# Patient Record
Sex: Female | Born: 1975 | Race: White | Hispanic: No | Marital: Married | Smoking: Current every day smoker
Health system: Southern US, Community
[De-identification: ages and names within clinical notes are randomized; demographics above are authoritative.]

## PROBLEM LIST (undated history)

## (undated) DIAGNOSIS — C801 Malignant (primary) neoplasm, unspecified: Secondary | ICD-10-CM

## (undated) DIAGNOSIS — G43909 Migraine, unspecified, not intractable, without status migrainosus: Secondary | ICD-10-CM

## (undated) DIAGNOSIS — K589 Irritable bowel syndrome without diarrhea: Secondary | ICD-10-CM

## (undated) DIAGNOSIS — T7840XA Allergy, unspecified, initial encounter: Secondary | ICD-10-CM

## (undated) DIAGNOSIS — F329 Major depressive disorder, single episode, unspecified: Secondary | ICD-10-CM

## (undated) DIAGNOSIS — F988 Other specified behavioral and emotional disorders with onset usually occurring in childhood and adolescence: Secondary | ICD-10-CM

## (undated) DIAGNOSIS — K219 Gastro-esophageal reflux disease without esophagitis: Secondary | ICD-10-CM

## (undated) DIAGNOSIS — F419 Anxiety disorder, unspecified: Secondary | ICD-10-CM

## (undated) DIAGNOSIS — F32A Depression, unspecified: Secondary | ICD-10-CM

## (undated) DIAGNOSIS — M797 Fibromyalgia: Secondary | ICD-10-CM

## (undated) HISTORY — PX: COLONOSCOPY: SHX174

## (undated) HISTORY — PX: COSMETIC SURGERY: SHX468

## (undated) HISTORY — PX: ESOPHAGOGASTRODUODENOSCOPY: SHX1529

## (undated) HISTORY — PX: SHOULDER ARTHROSCOPY: SHX128

## (undated) HISTORY — DX: Malignant (primary) neoplasm, unspecified: C80.1

## (undated) HISTORY — DX: Allergy, unspecified, initial encounter: T78.40XA

---

## 1999-06-12 ENCOUNTER — Ambulatory Visit (HOSPITAL_COMMUNITY): Admission: AD | Admit: 1999-06-12 | Discharge: 1999-06-12 | Payer: Self-pay | Admitting: Obstetrics and Gynecology

## 2004-07-06 ENCOUNTER — Ambulatory Visit: Payer: Self-pay | Admitting: Urology

## 2005-01-04 HISTORY — PX: BREAST ENHANCEMENT SURGERY: SHX7

## 2006-04-01 ENCOUNTER — Ambulatory Visit: Payer: Self-pay | Admitting: Family Medicine

## 2007-09-14 ENCOUNTER — Encounter: Payer: Self-pay | Admitting: Gastroenterology

## 2007-09-20 ENCOUNTER — Ambulatory Visit: Payer: Self-pay | Admitting: Family Medicine

## 2007-09-20 ENCOUNTER — Encounter: Payer: Self-pay | Admitting: Gastroenterology

## 2007-09-26 ENCOUNTER — Encounter: Payer: Self-pay | Admitting: Gastroenterology

## 2007-09-28 ENCOUNTER — Encounter: Payer: Self-pay | Admitting: Gastroenterology

## 2007-09-28 DIAGNOSIS — R948 Abnormal results of function studies of other organs and systems: Secondary | ICD-10-CM | POA: Insufficient documentation

## 2007-10-12 ENCOUNTER — Ambulatory Visit (HOSPITAL_COMMUNITY): Admission: RE | Admit: 2007-10-12 | Discharge: 2007-10-12 | Payer: Self-pay | Admitting: Gastroenterology

## 2007-10-12 ENCOUNTER — Ambulatory Visit: Payer: Self-pay | Admitting: Gastroenterology

## 2012-01-18 ENCOUNTER — Other Ambulatory Visit: Payer: Self-pay | Admitting: Gastroenterology

## 2012-01-18 DIAGNOSIS — R1013 Epigastric pain: Secondary | ICD-10-CM

## 2012-01-19 ENCOUNTER — Ambulatory Visit
Admission: RE | Admit: 2012-01-19 | Discharge: 2012-01-19 | Disposition: A | Discharge status: Home / Self Care | Payer: Managed Care, Other (non HMO) | Source: Ambulatory Visit | Attending: Gastroenterology | Admitting: Gastroenterology

## 2012-01-19 ENCOUNTER — Other Ambulatory Visit: Payer: Self-pay

## 2012-01-19 DIAGNOSIS — R1013 Epigastric pain: Secondary | ICD-10-CM

## 2012-01-19 MED ORDER — IOHEXOL 300 MG/ML  SOLN
100.0000 mL | Freq: Once | INTRAMUSCULAR | Status: AC | PRN
  Administered 2012-01-19: 100 mL via INTRAVENOUS

## 2012-01-19 MED ORDER — DIPHENHYDRAMINE HCL 50 MG/ML IJ SOLN
25.0000 mg | Freq: Once | INTRAMUSCULAR | Status: AC
  Administered 2012-01-19: 25 mg via INTRAVENOUS

## 2012-01-19 MED ORDER — DIPHENHYDRAMINE HCL 50 MG PO CAPS
50.0000 mg | ORAL_CAPSULE | Freq: Once | ORAL | Status: AC
  Administered 2012-01-19: 50 mg via ORAL

## 2012-01-19 NOTE — Progress Notes (Signed)
Pt to nursing area due to redness,hives and itching post CT scan. Dr. Alfredo Batty in to visit and po and oral benadryl given. Pt called for ride home. 1530 pt's redness and itching have decreased and pt is feeling much better. Pt left with CT tech.

## 2013-03-07 LAB — BASIC METABOLIC PANEL
BUN: 10 mg/dL (ref 4–21)
CREATININE: 0.1 mg/dL — AB (ref 0.5–1.1)
GLUCOSE: 97 mg/dL
Potassium: 4.5 mmol/L (ref 3.4–5.3)
Sodium: 140 mmol/L (ref 137–147)

## 2013-03-07 LAB — CBC AND DIFFERENTIAL
HCT: 40 % (ref 36–46)
HEMOGLOBIN: 13.7 g/dL (ref 12.0–16.0)
Neutrophils Absolute: 3 /uL
Platelets: 252 10*3/uL (ref 150–399)
WBC: 4.8 10^3/mL

## 2013-03-07 LAB — HEPATIC FUNCTION PANEL
ALT: 20 U/L (ref 7–35)
AST: 19 U/L (ref 13–35)
Alkaline Phosphatase: 55 U/L (ref 25–125)
Bilirubin, Total: 1.3 mg/dL

## 2013-03-07 LAB — TSH: TSH: 2.58 u[IU]/mL (ref 0.41–5.90)

## 2013-08-02 LAB — HM PAP SMEAR

## 2014-06-16 ENCOUNTER — Other Ambulatory Visit: Payer: Self-pay | Admitting: Family Medicine

## 2014-06-16 DIAGNOSIS — F419 Anxiety disorder, unspecified: Secondary | ICD-10-CM

## 2014-06-18 DIAGNOSIS — F419 Anxiety disorder, unspecified: Secondary | ICD-10-CM | POA: Insufficient documentation

## 2014-06-27 ENCOUNTER — Other Ambulatory Visit: Payer: Self-pay | Admitting: Family Medicine

## 2014-08-16 DIAGNOSIS — K219 Gastro-esophageal reflux disease without esophagitis: Secondary | ICD-10-CM

## 2014-08-16 DIAGNOSIS — N926 Irregular menstruation, unspecified: Secondary | ICD-10-CM

## 2014-08-16 DIAGNOSIS — F321 Major depressive disorder, single episode, moderate: Secondary | ICD-10-CM

## 2014-08-16 DIAGNOSIS — F17210 Nicotine dependence, cigarettes, uncomplicated: Secondary | ICD-10-CM

## 2014-08-16 DIAGNOSIS — G4700 Insomnia, unspecified: Secondary | ICD-10-CM | POA: Insufficient documentation

## 2014-08-16 DIAGNOSIS — G43909 Migraine, unspecified, not intractable, without status migrainosus: Secondary | ICD-10-CM

## 2014-08-16 DIAGNOSIS — F411 Generalized anxiety disorder: Secondary | ICD-10-CM

## 2014-08-16 DIAGNOSIS — F988 Other specified behavioral and emotional disorders with onset usually occurring in childhood and adolescence: Secondary | ICD-10-CM | POA: Insufficient documentation

## 2014-08-16 DIAGNOSIS — K279 Peptic ulcer, site unspecified, unspecified as acute or chronic, without hemorrhage or perforation: Secondary | ICD-10-CM | POA: Insufficient documentation

## 2014-08-16 HISTORY — DX: Major depressive disorder, single episode, moderate: F32.1

## 2014-08-16 HISTORY — DX: Generalized anxiety disorder: F41.1

## 2014-08-16 HISTORY — DX: Migraine, unspecified, not intractable, without status migrainosus: G43.909

## 2014-08-16 HISTORY — DX: Nicotine dependence, cigarettes, uncomplicated: F17.210

## 2014-08-16 HISTORY — DX: Gastro-esophageal reflux disease without esophagitis: K21.9

## 2014-08-16 HISTORY — DX: Irregular menstruation, unspecified: N92.6

## 2014-08-16 HISTORY — DX: Peptic ulcer, site unspecified, unspecified as acute or chronic, without hemorrhage or perforation: K27.9

## 2014-08-20 ENCOUNTER — Encounter: Payer: Self-pay | Admitting: Family Medicine

## 2014-08-20 ENCOUNTER — Ambulatory Visit (INDEPENDENT_AMBULATORY_CARE_PROVIDER_SITE_OTHER): Payer: Managed Care, Other (non HMO) | Admitting: Family Medicine

## 2014-08-20 VITALS — BP 98/60 | HR 62 | Temp 98.4°F | Resp 16 | Wt 142.0 lb

## 2014-08-20 DIAGNOSIS — Z720 Tobacco use: Secondary | ICD-10-CM | POA: Diagnosis not present

## 2014-08-20 DIAGNOSIS — K219 Gastro-esophageal reflux disease without esophagitis: Secondary | ICD-10-CM | POA: Diagnosis not present

## 2014-08-20 DIAGNOSIS — F321 Major depressive disorder, single episode, moderate: Secondary | ICD-10-CM

## 2014-08-20 DIAGNOSIS — F909 Attention-deficit hyperactivity disorder, unspecified type: Secondary | ICD-10-CM

## 2014-08-20 DIAGNOSIS — F419 Anxiety disorder, unspecified: Secondary | ICD-10-CM

## 2014-08-20 DIAGNOSIS — F988 Other specified behavioral and emotional disorders with onset usually occurring in childhood and adolescence: Secondary | ICD-10-CM

## 2014-08-20 DIAGNOSIS — F17210 Nicotine dependence, cigarettes, uncomplicated: Secondary | ICD-10-CM

## 2014-08-20 DIAGNOSIS — G43809 Other migraine, not intractable, without status migrainosus: Secondary | ICD-10-CM | POA: Diagnosis not present

## 2014-08-20 MED ORDER — AMPHETAMINE-DEXTROAMPHET ER 30 MG PO CP24: 30.0000 mg | ORAL_CAPSULE | Freq: Every day | ORAL | Status: DC

## 2014-08-20 MED ORDER — OMEPRAZOLE 20 MG PO TBEC: 20.0000 mg | DELAYED_RELEASE_TABLET | Freq: Every day | ORAL | Status: DC

## 2014-08-20 MED ORDER — CLONAZEPAM 1 MG PO TABS: 1.0000 mg | ORAL_TABLET | Freq: Every day | ORAL | Status: DC

## 2014-08-20 NOTE — Progress Notes (Signed)
Patient ID: Krystal Marks, female   DOB: 1975-02-23, 39 y.o.   MRN: [id number]    Subjective:  HPI Pt is following for a ADD. She reports that she had stopped the Adderall because she was not working and did not needs it. Now she is working and has insurance and feels like she needs it. She would rather take the Vyvanse if it is generic yet. Because she does better on it, but can not afford a 50$ co pay each month.  Pt is no longer taking Sertraline for depression and is taking the Clonazepam at bedtime. She has cut back on this from 2 mg nightly to 0.5mg  nightly. She reports that she is also trying to only take them every other night, if she can. But does not want to stop them "cold [place]". She will need a refill today.   She also wanted to ask about her Omeprazole and if she needs to be taking that with her stomach problems. She stopped it because she thought it was constipating her. She thinks maybe she needs one that has magnesium in it to help with that.    Prior to Admission medications   Medication Sig Start Date End Date Taking? Authorizing Provider  clonazePAM (KLONOPIN) 1 MG tablet TAKE 1-2 TABLETS BY MOUTH AT BEDTIME 06/18/14  Yes Richard Hulen Shouts., MD  Multiple Vitamin (MULTIVITAMIN) tablet Take 1 tablet by mouth daily.   Yes Historical Provider, MD  SUMAtriptan (IMITREX) 100 MG tablet Take by mouth. 03/18/14  Yes Historical Provider, MD  amphetamine-dextroamphetamine (ADDERALL XR) 30 MG 24 hr capsule Take by mouth. 07/11/13   Historical Provider, MD  Omeprazole 20 MG TBEC Take by mouth.    Historical Provider, MD    Patient Active Problem List   Diagnosis Date Noted  . ADD (attention deficit disorder) 08/16/2014  . Cigarette smoker 08/16/2014  . Depression, major, single episode, moderate 08/16/2014  . Anxiety, generalized 08/16/2014  . Acid reflux 08/16/2014  . Irregular bleeding 08/16/2014  . Cannot sleep 08/16/2014  . Headache, migraine 08/16/2014  . Gastroduodenal  ulcer 08/16/2014  . Anxiety 06/18/2014  . NONSPECIFIC ABNORM RESULTS OTH SPEC FUNCT STUDY 09/28/2007    History reviewed. No pertinent past medical history.  Social History   Social History  . Marital Status: Single    Spouse Name: N/A  . Number of Children: N/A  . Years of Education: N/A   Occupational History  . Not on file.   Social History Main Topics  . Smoking status: Current Every Day Smoker -- 0.50 packs/day  . Smokeless tobacco: Not on file     Comment: 5-6 cigarettes daily  . Alcohol Use: Yes     Comment: 2-3 drinks a week.  . Drug Use: No  . Sexual Activity: Not on file   Other Topics Concern  . Not on file   Social History Narrative    Allergies  Allergen Reactions  . Iodinated Diagnostic Agents Itching    Omnpaque i 300, 10 min delayed reaction of large hives, itching & urticaria// will require 13hr prep for future iv contrast,//a.calhoun    Review of Systems  Constitutional: Positive for malaise/fatigue.  HENT: Negative.   Eyes: Negative.   Respiratory: Negative.   Cardiovascular: Negative.   Gastrointestinal: Positive for constipation.  Genitourinary: Negative.   Musculoskeletal: Negative.   Skin: Negative.   Neurological: Negative.   Endo/Heme/Allergies: Negative.   Psychiatric/Behavioral: Negative.      There is no immunization history on file for  this patient. Objective:  BP 98/60 mmHg  Pulse 62  Temp(Src) 98.4 F (36.9 C) (Oral)  Resp 16  Wt 142 lb (64.411 kg)  Physical Exam  Lab Results  Component Value Date   WBC 4.8 03/07/2013   HGB 13.7 03/07/2013   HCT 40 03/07/2013   PLT 252 03/07/2013   TSH 2.58 03/07/2013    CMP     Component Value Date/Time   NA 140 03/07/2013   K 4.5 03/07/2013   BUN 10 03/07/2013   CREATININE 0.1* 03/07/2013   AST 19 03/07/2013   ALT 20 03/07/2013   ALKPHOS 55 03/07/2013    Assessment and Plan :   1. Other type of migraine Not an issue presently.   2. Gastroesophageal reflux  disease, esophagitis presence not specified/chronic IBS symptoms  - Omeprazole 20 MG TBEC; Take 1 tablet (20 mg total) by mouth daily.  Dispense: 30 each; Refill: 12  3. Cigarette smoker   4. Anxiety  - clonazePAM (KLONOPIN) 1 MG tablet; Take 1-2 tablets (1-2 mg total) by mouth at bedtime.  Dispense: 60 tablet; Refill: 5  5. Depression, major, single episode, moderate This is presently in remission.   6. ADD (attention deficit disorder)  - amphetamine-dextroamphetamine (ADDERALL XR) 30 MG 24 hr capsule; Take 1 capsule (30 mg total) by mouth daily.  Dispense: 30 capsule; Refill: 0 She prefers Vyvanse but cannot afford this. Return to clinic for any refills.  Patient was seen and examined by Dr. Julieanne Manson, and noted scribed by Dimas Chyle, CMA   Julieanne Manson MD [facility] 08/20/2014 3:13 PM

## 2014-11-15 LAB — HM PAP SMEAR

## 2014-12-12 ENCOUNTER — Telehealth: Payer: Self-pay | Admitting: Family Medicine

## 2014-12-12 NOTE — Telephone Encounter (Signed)
Pt called back stating she has found the information she needed and does not need a call back/MW

## 2014-12-12 NOTE — Telephone Encounter (Signed)
Pt called and request that Krystal Marks call her.  She didn't really want to say why she was calling.  She just said [place] knew her and she preferred she just call her.    Call back [phone].  Please call after 12:00 noon.  Thanks Barth Kirkseri

## 2014-12-18 ENCOUNTER — Emergency Department (HOSPITAL_COMMUNITY)
Admission: EM | Admit: 2014-12-18 | Discharge: 2014-12-18 | Disposition: A | Discharge status: Home / Self Care | Payer: Managed Care, Other (non HMO) | Attending: Emergency Medicine | Admitting: Emergency Medicine

## 2014-12-18 ENCOUNTER — Encounter (HOSPITAL_COMMUNITY): Payer: Self-pay | Admitting: Emergency Medicine

## 2014-12-18 ENCOUNTER — Emergency Department (HOSPITAL_COMMUNITY): Payer: Managed Care, Other (non HMO)

## 2014-12-18 DIAGNOSIS — M797 Fibromyalgia: Secondary | ICD-10-CM | POA: Diagnosis not present

## 2014-12-18 DIAGNOSIS — Z79899 Other long term (current) drug therapy: Secondary | ICD-10-CM | POA: Diagnosis not present

## 2014-12-18 DIAGNOSIS — Z3202 Encounter for pregnancy test, result negative: Secondary | ICD-10-CM | POA: Insufficient documentation

## 2014-12-18 DIAGNOSIS — R1013 Epigastric pain: Secondary | ICD-10-CM | POA: Diagnosis not present

## 2014-12-18 DIAGNOSIS — F419 Anxiety disorder, unspecified: Secondary | ICD-10-CM | POA: Insufficient documentation

## 2014-12-18 DIAGNOSIS — R109 Unspecified abdominal pain: Secondary | ICD-10-CM | POA: Diagnosis present

## 2014-12-18 DIAGNOSIS — F17200 Nicotine dependence, unspecified, uncomplicated: Secondary | ICD-10-CM | POA: Diagnosis not present

## 2014-12-18 DIAGNOSIS — K219 Gastro-esophageal reflux disease without esophagitis: Secondary | ICD-10-CM | POA: Diagnosis not present

## 2014-12-18 HISTORY — DX: Gastro-esophageal reflux disease without esophagitis: K21.9

## 2014-12-18 HISTORY — DX: Fibromyalgia: M79.7

## 2014-12-18 HISTORY — DX: Anxiety disorder, unspecified: F41.9

## 2014-12-18 LAB — CBC
HCT: 40.8 % (ref 36.0–46.0)
Hemoglobin: 14 g/dL (ref 12.0–15.0)
MCH: 31.5 pg (ref 26.0–34.0)
MCHC: 34.3 g/dL (ref 30.0–36.0)
MCV: 91.9 fL (ref 78.0–100.0)
PLATELETS: 245 10*3/uL (ref 150–400)
RBC: 4.44 MIL/uL (ref 3.87–5.11)
RDW: 12.4 % (ref 11.5–15.5)
WBC: 7.4 10*3/uL (ref 4.0–10.5)

## 2014-12-18 LAB — URINALYSIS, ROUTINE W REFLEX MICROSCOPIC
BILIRUBIN URINE: NEGATIVE
Glucose, UA: NEGATIVE mg/dL
KETONES UR: NEGATIVE mg/dL
Leukocytes, UA: NEGATIVE
Nitrite: NEGATIVE
Protein, ur: NEGATIVE mg/dL
Specific Gravity, Urine: 1.012 (ref 1.005–1.030)
pH: 6.5 (ref 5.0–8.0)

## 2014-12-18 LAB — COMPREHENSIVE METABOLIC PANEL
ALBUMIN: 4 g/dL (ref 3.5–5.0)
ALK PHOS: 48 U/L (ref 38–126)
ALT: 76 U/L — AB (ref 14–54)
AST: 46 U/L — ABNORMAL HIGH (ref 15–41)
Anion gap: 7 (ref 5–15)
BILIRUBIN TOTAL: 1.4 mg/dL — AB (ref 0.3–1.2)
BUN: 7 mg/dL (ref 6–20)
CALCIUM: 9.1 mg/dL (ref 8.9–10.3)
CO2: 24 mmol/L (ref 22–32)
CREATININE: 0.79 mg/dL (ref 0.44–1.00)
Chloride: 106 mmol/L (ref 101–111)
GFR calc non Af Amer: >60 mL/min (ref >60)
GLUCOSE: 102 mg/dL — AB (ref 65–99)
Potassium: 3.5 mmol/L (ref 3.5–5.1)
SODIUM: 137 mmol/L (ref 135–145)
TOTAL PROTEIN: 6.9 g/dL (ref 6.5–8.1)

## 2014-12-18 LAB — URINE MICROSCOPIC-ADD ON

## 2014-12-18 LAB — LIPASE, BLOOD: Lipase: 30 U/L (ref 11–51)

## 2014-12-18 LAB — POC URINE PREG, ED: PREG TEST UR: NEGATIVE

## 2014-12-18 MED ORDER — OXYCODONE-ACETAMINOPHEN 5-325 MG PO TABS: 1.0000 | ORAL_TABLET | ORAL | Status: DC | PRN

## 2014-12-18 MED ORDER — ONDANSETRON HCL 8 MG PO TABS: 8.0000 mg | ORAL_TABLET | Freq: Three times a day (TID) | ORAL | Status: DC | PRN

## 2014-12-18 MED ORDER — OXYCODONE-ACETAMINOPHEN 5-325 MG PO TABS
1.0000 | ORAL_TABLET | Freq: Once | ORAL | Status: AC
  Administered 2014-12-18: 1 via ORAL
  Filled 2014-12-18: qty 1

## 2014-12-18 NOTE — ED Notes (Signed)
Patient verbalized understanding of discharge instructions and denies any further needs or questions at this time. VS stable. Patient ambulatory with steady gait.  

## 2014-12-18 NOTE — ED Notes (Signed)
C/o mid upper abd pain and bilateral flank pain since 3:50pm.  Denies nausea and vomiting.  Reports 2 episodes of diarrhea this morning and then normal BM since then. Denies urinary symptoms.

## 2014-12-18 NOTE — ED Notes (Signed)
Dr Cook at bedside

## 2014-12-18 NOTE — ED Provider Notes (Signed)
CSN: [id number]     Arrival date & time 12/18/14  1824 History   First MD Initiated Contact with Patient 12/18/14 1915     Chief Complaint  Patient presents with  . Abdominal Pain     (Consider location/radiation/quality/duration/timing/severity/associated sxs/prior Treatment) HPI.... Epigastric and bilateral flank pain left greater than right since approximately 3:50 PM today. Patient has remote history of esophageal spasm, constipation, GERD.  No fever, sweats, chills, dysuria, chest pain, dyspnea, jaundice. Severity of symptoms is mild to moderate. Nothing makes symptoms better or worse. Symptoms not associated with eating.  Past Medical History  Diagnosis Date  . Anxiety   . Fibromyalgia   . GERD (gastroesophageal reflux disease)    Past Surgical History  Procedure Laterality Date  . Breast enhancement surgery  2007  . Shoulder arthroscopy Right    Family History  Problem Relation Age of Onset  . Osteoporosis Maternal Grandmother   . Esophageal cancer Maternal Grandfather    Social History  Substance Use Topics  . Smoking status: Current Every Day Smoker -- 0.50 packs/day  . Smokeless tobacco: None     Comment: 5-6 cigarettes daily  . Alcohol Use: Yes     Comment: 2-3 drinks a week.   OB History    No data available     Review of Systems  All other systems reviewed and are negative.     Allergies  Iodinated diagnostic agents and Dilaudid  Home Medications   Prior to Admission medications   Medication Sig Start Date End Date Taking? Authorizing Provider  buPROPion (WELLBUTRIN XL) 300 MG 24 hr tablet Take 300 mg by mouth daily.   Yes Historical Provider, MD  clonazePAM (KLONOPIN) 0.5 MG tablet Take 0.5 mg by mouth every evening. Take every evening per patient   Yes Historical Provider, MD  Turmeric 450 MG CAPS Take 1 capsule by mouth daily.   Yes Historical Provider, MD  vitamin B-12 (CYANOCOBALAMIN) 1000 MCG tablet Take 1,000 mcg by mouth daily.   Yes  Historical Provider, MD  amphetamine-dextroamphetamine (ADDERALL XR) 30 MG 24 hr capsule Take 1 capsule (30 mg total) by mouth daily. Patient not taking: Reported on 12/18/2014 08/20/14   Maple Hudson., MD  clonazePAM (KLONOPIN) 1 MG tablet Take 1-2 tablets (1-2 mg total) by mouth at bedtime. Patient not taking: Reported on 12/18/2014 08/20/14   Maple Hudson., MD  Omeprazole 20 MG TBEC Take 1 tablet (20 mg total) by mouth daily. Patient not taking: Reported on 12/18/2014 08/20/14   Maple Hudson., MD  ondansetron (ZOFRAN) 8 MG tablet Take 1 tablet (8 mg total) by mouth every 8 (eight) hours as needed for nausea or vomiting. 12/18/14   Donnetta Hutching, MD  oxyCODONE-acetaminophen (PERCOCET) 5-325 MG tablet Take 1-2 tablets by mouth every 4 (four) hours as needed. 12/18/14   Donnetta Hutching, MD   BP 114/64 mmHg  Pulse 58  Temp(Src) 98.4 F (36.9 C) (Oral)  Resp 16  Ht  (1.651 m)  Wt 146 lb (66.225 kg)  BMI 24.30 kg/m2  SpO2 100%  LMP 12/17/2014 Physical Exam  Constitutional: She is oriented to person, place, and time. She appears well-developed and well-nourished.  HENT:  Head: Normocephalic and atraumatic.  Eyes: Conjunctivae and EOM are normal. Pupils are equal, round, and reactive to light.  Neck: Normal range of motion. Neck supple.  Cardiovascular: Normal rate and regular rhythm.   Pulmonary/Chest: Effort normal and breath sounds normal.  Abdominal: Soft. Bowel sounds  are normal.  Min epigastric tenderness  Musculoskeletal: Normal range of motion.  Neurological: She is alert and oriented to person, place, and time.  Skin: Skin is warm and dry.  Psychiatric: She has a normal mood and affect. Her behavior is normal.  Nursing note and vitals reviewed.   ED Course  Procedures (including critical care time) Labs Review Labs Reviewed  COMPREHENSIVE METABOLIC PANEL - Abnormal; Notable for the following:    Glucose, Bld 102 (*)    AST 46 (*)    ALT 76 (*)     Total Bilirubin 1.4 (*)    All other components within normal limits  URINALYSIS, ROUTINE W REFLEX MICROSCOPIC (NOT AT [facility]) - Abnormal; Notable for the following:    Hgb urine dipstick SMALL (*)    All other components within normal limits  URINE MICROSCOPIC-ADD ON - Abnormal; Notable for the following:    Squamous Epithelial / LPF 0-5 (*)    Bacteria, UA RARE (*)    All other components within normal limits  LIPASE, BLOOD  CBC  POC URINE PREG, ED    Imaging Review [place] Abdomen Limited  12/18/2014  CLINICAL DATA:  Severe epigastric pain radiating to left upper quadrant and right upper quadrant. Loose bowel movements this morning. Dizziness and tingling in the hands starting at 4 p.m. EXAM: US ABDOMEN LIMITED - RIGHT UPPER QUADRANT COMPARISON:  CT abdomen and pelvis 01/19/2012 FINDINGS: Gallbladder: No gallstones or wall thickening visualized. No sonographic Murphy sign noted. Common bile duct: Diameter: 2.2 mm, normal Liver: No focal lesion identified. Within normal limits in parenchymal echogenicity. IMPRESSION: No evidence of cholelithiasis or cholecystitis. Electronically Signed   By: Burman NievesWilliam  Stevens M.D.   On: 12/18/2014 21:41   I have personally reviewed and evaluated these images and lab results as part of my medical decision-making.   EKG Interpretation None      MDM   Final diagnoses:  Epigastric pain    No acute abdomen. Elevated liver functions and bilirubin discussed with patient, her father, her fianc. White count was normal. Urinalysis normal. Ultrasound of gallbladder normal. Lipase normal. I encouraged her to follow-up with her primary care doctor. If symptoms persist, she will need a CT scan of abdomen and pelvis. Discharge medications Percocet and Zofran 8 mg.    Donnetta HutchingBrian Anikah Hogge, MD 12/18/14 [phone]

## 2014-12-18 NOTE — Discharge Instructions (Signed)
Liver tests were slightly elevated. Otherwise tests were good including normal gallbladder ultrasound. Recommend follow-up with your primary care doctor. You may need a CT scan of your abdomen if symptoms persist. Prescription for pain and nausea medicine

## 2014-12-19 ENCOUNTER — Encounter: Payer: Self-pay | Admitting: Family Medicine

## 2014-12-19 ENCOUNTER — Telehealth: Payer: Self-pay | Admitting: Family Medicine

## 2014-12-19 ENCOUNTER — Ambulatory Visit (INDEPENDENT_AMBULATORY_CARE_PROVIDER_SITE_OTHER): Payer: Managed Care, Other (non HMO) | Admitting: Family Medicine

## 2014-12-19 VITALS — BP 100/64 | HR 56 | Temp 98.3°F | Resp 16 | Wt 147.0 lb

## 2014-12-19 DIAGNOSIS — Z09 Encounter for follow-up examination after completed treatment for conditions other than malignant neoplasm: Secondary | ICD-10-CM | POA: Diagnosis not present

## 2014-12-19 DIAGNOSIS — R1013 Epigastric pain: Secondary | ICD-10-CM | POA: Diagnosis not present

## 2014-12-19 DIAGNOSIS — R748 Abnormal levels of other serum enzymes: Secondary | ICD-10-CM

## 2014-12-19 DIAGNOSIS — K219 Gastro-esophageal reflux disease without esophagitis: Secondary | ICD-10-CM

## 2014-12-19 DIAGNOSIS — K5909 Other constipation: Secondary | ICD-10-CM

## 2014-12-19 MED ORDER — OXYCODONE-ACETAMINOPHEN 5-325 MG PO TABS: 1.0000 | ORAL_TABLET | ORAL | Status: DC | PRN

## 2014-12-19 MED ORDER — DOCUSATE SODIUM 100 MG PO CAPS: 50.0000 mg | ORAL_CAPSULE | Freq: Every day | ORAL | Status: DC

## 2014-12-19 MED ORDER — DOCUSATE SODIUM 50 MG PO CAPS: 50.0000 mg | ORAL_CAPSULE | Freq: Two times a day (BID) | ORAL | Status: DC

## 2014-12-19 MED ORDER — OMEPRAZOLE 20 MG PO TBEC: 20.0000 mg | DELAYED_RELEASE_TABLET | Freq: Two times a day (BID) | ORAL | Status: DC

## 2014-12-19 NOTE — Telephone Encounter (Signed)
Please review-aa 

## 2014-12-19 NOTE — Progress Notes (Signed)
Patient ID: Krystal Marks, female   DOB: [birth year], 39 y.o.   MRN: [id number]   Krystal Marks  MRN: [id number] DOB: [birth year]  Subjective:  HPI   1. Hospital discharge follow-up The patient is a 39 year old female who presents today for follow up after being seen in the ED for epigastric abdominal pain.  The pain started around 3 pm yesterday and she was seen during the evening.  She complained of bilateral flank pain with it being greater on the left than the right.   Pt has some nausea this morning. Her home meds at that time were Bupropion, Turmetric, B12, Klonopin, Omeprazole, Zofran and Percocet.  She has not been taking Adderall for ADD and more than a year. Of note is that she is now engaged to be married next year sometime.her fianc is with her today.  Her ED labs included Mec C, UA with Micro, Lipase, CBC, Urine pregnancy, EKG and Gall bladder ultrasound.  She had essentially negative findings except for the following: Glucose 102, AST 46, ALT 76, Total Bili 1.4.    She was discharged home on Percocet and Zofran and had recommendation to follow up with primary provider for possible CT of the abdomen and pelvis.  2. Epigastric abdominal pain Patient states she has not been taking the Omeprazole daily.  She had stopped taking it altogether due to it causing her constipation.  She started back on it about 6 weeks ago and is taking it every other day.  Prior to going back on the medication she noticed having increased difficulty swallowing and indigestion/ food coming up.    3. Elevated liver enzymes    Patient Active Problem List   Diagnosis Date Noted  . ADD (attention deficit disorder) 08/16/2014  . Cigarette smoker 08/16/2014  . Depression, major, single episode, moderate (HCC) 08/16/2014  . Anxiety, generalized 08/16/2014  . Acid reflux 08/16/2014  . Irregular bleeding 08/16/2014  . Cannot sleep 08/16/2014  . Headache, migraine 08/16/2014  . Gastroduodenal ulcer  08/16/2014  . Anxiety 06/18/2014  . NONSPECIFIC ABNORM RESULTS OTH SPEC FUNCT STUDY 09/28/2007    Past Medical History  Diagnosis Date  . Anxiety   . Fibromyalgia   . GERD (gastroesophageal reflux disease)     Social History   Social History  . Marital Status: Single    Spouse Name: N/A  . Number of Children: N/A  . Years of Education: N/A   Occupational History  . Not on file.   Social History Main Topics  . Smoking status: Current Every Day Smoker -- 0.50 packs/day  . Smokeless tobacco: Not on file     Comment: 5-6 cigarettes daily  . Alcohol Use: Yes     Comment: 2-3 drinks a week.  . Drug Use: No  . Sexual Activity: Not on file   Other Topics Concern  . Not on file   Social History Narrative    Outpatient Prescriptions Prior to Visit  Medication Sig Dispense Refill  . amphetamine-dextroamphetamine (ADDERALL XR) 30 MG 24 hr capsule Take 1 capsule (30 mg total) by mouth daily. (Patient not taking: Reported on 12/18/2014) 30 capsule 0  . buPROPion (WELLBUTRIN XL) 300 MG 24 hr tablet Take 300 mg by mouth daily.    . clonazePAM (KLONOPIN) 0.5 MG tablet Take 0.5 mg by mouth every evening. Take every evening per patient    . clonazePAM (KLONOPIN) 1 MG tablet Take 1-2 tablets (1-2 mg total) by mouth at bedtime. (Patient not taking: Reported  on 12/18/2014) 60 tablet 5  . Omeprazole 20 MG TBEC Take 1 tablet (20 mg total) by mouth daily. (Patient not taking: Reported on 12/18/2014) 30 each 12  . ondansetron (ZOFRAN) 8 MG tablet Take 1 tablet (8 mg total) by mouth every 8 (eight) hours as needed for nausea or vomiting. 12 tablet 0  . oxyCODONE-acetaminophen (PERCOCET) 5-325 MG tablet Take 1-2 tablets by mouth every 4 (four) hours as needed. 20 tablet 0  . Turmeric 450 MG CAPS Take 1 capsule by mouth daily.    . vitamin B-12 (CYANOCOBALAMIN) 1000 MCG tablet Take 1,000 mcg by mouth daily.     No facility-administered medications prior to visit.    Allergies  Allergen  Reactions  . Iodinated Diagnostic Agents Itching    Omnpaque i 300, 10 min delayed reaction of large hives, itching & urticaria// will require 13hr prep for future iv contrast,//a.calhoun  . Dilaudid [Hydromorphone]     Hard times breathing   Outpatient Encounter Prescriptions as of 12/19/2014  Medication Sig  . buPROPion (WELLBUTRIN XL) 300 MG 24 hr tablet Take 300 mg by mouth daily.  . clonazePAM (KLONOPIN) 0.5 MG tablet Take 0.5 mg by mouth every evening. Take every evening per patient  . Omeprazole 20 MG TBEC Take 1 tablet (20 mg total) by mouth daily. (Patient not taking: Reported on 12/18/2014)  . ondansetron (ZOFRAN) 8 MG tablet Take 1 tablet (8 mg total) by mouth every 8 (eight) hours as needed for nausea or vomiting.  Marland Kitchen oxyCODONE-acetaminophen (PERCOCET) 5-325 MG tablet Take 1-2 tablets by mouth every 4 (four) hours as needed.  . Turmeric 450 MG CAPS Take 1 capsule by mouth daily.  . vitamin B-12 (CYANOCOBALAMIN) 1000 MCG tablet Take 1,000 mcg by mouth daily.  . [DISCONTINUED] amphetamine-dextroamphetamine (ADDERALL XR) 30 MG 24 hr capsule Take 1 capsule (30 mg total) by mouth daily. (Patient not taking: Reported on 12/18/2014)  . [DISCONTINUED] clonazePAM (KLONOPIN) 1 MG tablet Take 1-2 tablets (1-2 mg total) by mouth at bedtime. (Patient not taking: Reported on 12/18/2014)   No facility-administered encounter medications on file as of 12/19/2014.    Review of Systems  Constitutional: Positive for malaise/fatigue. Negative for fever, chills and diaphoresis.  Respiratory: Negative for cough, sputum production, shortness of breath and wheezing.   Cardiovascular: Positive for palpitations. Negative for chest pain and orthopnea.  Gastrointestinal: Positive for heartburn, nausea and abdominal pain. Negative for vomiting, diarrhea, constipation, blood in stool and melena.       Patient has been more difficulty swallowing, having food come up, feeling like fire in the pit of her stomach.   Genitourinary: Negative.   Musculoskeletal: Negative.   Neurological: Negative.   Psychiatric/Behavioral: Negative.    Objective:  LMP 12/17/2014  Physical Exam  Constitutional: She is oriented to person, place, and time and well-developed, well-nourished, and in no distress.  HENT:  Head: Normocephalic and atraumatic.  Right Ear: External ear normal.  Left Ear: External ear normal.  Nose: Nose normal.  Eyes: Conjunctivae are normal.  Neck: Neck supple.  Cardiovascular: Normal rate, regular rhythm and normal heart sounds.   Pulmonary/Chest: Effort normal and breath sounds normal.  Abdominal: Soft. Bowel sounds are normal. There is no tenderness.  Neurological: She is alert and oriented to person, place, and time. Gait normal.  Skin: Skin is warm and dry.  Psychiatric: Mood, memory, affect and judgment normal.    Assessment and Plan :   1. Hospital discharge follow-up   2. Epigastric abdominal pain  Pt 50% better since ED visit. - CT Abdomen Wo Contrast; Future - oxyCODONE-acetaminophen (PERCOCET) 5-325 MG tablet; Take 1-2 tablets by mouth every 4 (four) hours as needed.  Dispense: 20 tablet; Refill: 0 Very possibly viral etiology with present findings. 3. Elevated liver enzymes   4. Gastroesophageal reflux disease, esophagitis presence not specified  - Omeprazole 20 MG TBEC; Take 1 tablet (20 mg total) by mouth 2 (two) times daily.  Dispense: 60 each; Refill: 12  5. Other constipation  - docusate sodium (COLACE) 50 MG capsule; Take 1 capsule (50 mg total) by mouth 2 (two) times daily.  Dispense: 10 capsule; Refill: 0 6. ADD Under control with no medications for the past year. 7. Chronic depression and anxiety. Presently controlled Julieanne Mansonichard Gilbert MD [facility] 12/19/2014 9:15 AM

## 2014-12-19 NOTE — Telephone Encounter (Signed)
Pharmacy only has 100mg  colace.  No 50. Mg can you change RX,  Please advise pharmacy  Thanks teri

## 2014-12-19 NOTE — Telephone Encounter (Signed)
Ok to change thanks

## 2014-12-20 ENCOUNTER — Encounter (HOSPITAL_COMMUNITY): Payer: Self-pay

## 2014-12-20 ENCOUNTER — Ambulatory Visit (HOSPITAL_COMMUNITY): Payer: Managed Care, Other (non HMO)

## 2014-12-20 ENCOUNTER — Ambulatory Visit (HOSPITAL_COMMUNITY)
Admission: RE | Admit: 2014-12-20 | Discharge: 2014-12-20 | Disposition: A | Discharge status: Home / Self Care | Payer: Managed Care, Other (non HMO) | Source: Ambulatory Visit | Attending: Family Medicine | Admitting: Family Medicine

## 2014-12-20 DIAGNOSIS — R1013 Epigastric pain: Secondary | ICD-10-CM | POA: Insufficient documentation

## 2014-12-23 ENCOUNTER — Telehealth: Payer: Self-pay | Admitting: Family Medicine

## 2014-12-23 NOTE — Telephone Encounter (Signed)
Have you seen the report?-aa

## 2014-12-23 NOTE — Telephone Encounter (Signed)
Pt called wanting results from [facility] CT abd Friday [facility].  Her call back is [phone]  Thanks, Barth Kirkseri

## 2014-12-24 ENCOUNTER — Ambulatory Visit (HOSPITAL_COMMUNITY): Payer: Managed Care, Other (non HMO)

## 2014-12-24 ENCOUNTER — Ambulatory Visit: Payer: Managed Care, Other (non HMO)

## 2014-12-27 ENCOUNTER — Telehealth: Payer: Self-pay

## 2014-12-27 NOTE — Telephone Encounter (Signed)
-----   Message from Maple Hudsonichard L Gilbert Jr., MD sent at 12/24/2014 11:28 AM EST ----- Add sucralfate 1 gram ac and hs--before meals and at bedtime ,--and refer to GI.,

## 2014-12-27 NOTE — Telephone Encounter (Signed)
Notes Recorded by Samara DeistAnastasiya Aleksandrova V on 12/26/2014 at 10:49 AM [email or web address] Notes Recorded by Maple Hudsonichard L Gilbert Jr., MD on 12/24/2014 at 11:28 AM Add sucralfate 1 gram ac and hs--before meals and at bedtime ,--and refer to GI., Notes Recorded by Cyndia BentBrittany M Byrd, CMA on 12/23/2014 at 4:40 PM Pt informed and voiced understanding of results. 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 She would like to know what the next step is.

## 2015-01-01 MED ORDER — SUCRALFATE 1 G PO TABS: 1.0000 g | ORAL_TABLET | Freq: Four times a day (QID) | ORAL | Status: DC

## 2015-01-01 NOTE — Telephone Encounter (Signed)
RX for carafate sent in with attached note to the pharmacy to have pt contact us so we can explain the medication and referral to GI if she wants to proceed with this, we have attempted to contact pt several times with no response back as far as i can tell. Will save this message to the chart and wait for pat to call us. Thank you-aa

## 2015-01-08 ENCOUNTER — Encounter: Payer: Self-pay | Admitting: Family Medicine

## 2015-01-08 ENCOUNTER — Ambulatory Visit (INDEPENDENT_AMBULATORY_CARE_PROVIDER_SITE_OTHER): Payer: Managed Care, Other (non HMO) | Admitting: Family Medicine

## 2015-01-08 VITALS — BP 104/62 | HR 62 | Temp 98.7°F | Resp 16 | Wt 150.8 lb

## 2015-01-08 DIAGNOSIS — B349 Viral infection, unspecified: Secondary | ICD-10-CM | POA: Diagnosis not present

## 2015-01-08 DIAGNOSIS — R52 Pain, unspecified: Secondary | ICD-10-CM | POA: Diagnosis not present

## 2015-01-08 LAB — POCT INFLUENZA A/B
Influenza A, POC: NEGATIVE
Influenza B, POC: NEGATIVE

## 2015-01-08 MED ORDER — HYDROCODONE-HOMATROPINE 5-1.5 MG/5ML PO SYRP: ORAL_SOLUTION | ORAL | Status: DC

## 2015-01-08 NOTE — Progress Notes (Signed)
Subjective:     Patient ID: Krystal Marks, female   DOB: 05-Apr-1975, 40 y.o.   MRN: [id number]  HPI  Chief Complaint  Patient presents with  . Sinus Problem    Patient comes in office today with conerns of cold/flu like symptoms for the past 24 hrs. Patient states that yesterday when she woke up she had a sinus headache that went across her fore head and sinus pressure below her eyes. Patient reports the following symptoms: nasal acongestion, dizziness, low grade fever high of 100, body aches, dry cough, vertigo and fatigue. Patient has tried taking otc Tylenol and Sudafed  Reports the flu shot 09/2014.   Review of Systems     Objective:   Physical Exam  Constitutional: She appears well-developed and well-nourished. She appears ill.  Ears: T.M's intact without inflammation Throat: no tonsillar enlargement or exudate Neck: no cervical adenopathy Lungs: clear     Assessment:    1. Body ache - POCT Influenza A/B  2. Viral syndrom - HYDROcodone-homatropine (HYCODAN) 5-1.5 MG/5ML syrup; 5 ml 4-6 hours as needed for cough  Dispense: 240 mL; Refill: 0    Plan:    Work excuse for 1/3-1/6. Discussed continued use of decongestants, expectorants, and Delsym for non-sedating cough suppression.

## 2015-01-08 NOTE — Patient Instructions (Signed)
Add Mucinex along with decongestants like Sudafed. May also try Delsym for cough.

## 2015-01-10 ENCOUNTER — Telehealth: Payer: Self-pay | Admitting: Family Medicine

## 2015-01-10 NOTE — Telephone Encounter (Signed)
Please review office note and advise

## 2015-01-10 NOTE — Telephone Encounter (Signed)
It takes 7-10 days to start feeling better with this virus. However, If you are having a high fever (>101) need to see you back in the office.

## 2015-01-10 NOTE — Telephone Encounter (Signed)
Pt stated her symptoms haven't improved they have gotten worse since her OV on 01/08/15. Pt stated she was advised to call if not feeling better and that the over the counter hasn't helped. [facility] but if they close early due to weather she would like it sent to [facility]. Please advise. Thanks TNP

## 2015-01-10 NOTE — Telephone Encounter (Signed)
Patient denies having fever at all but states that she is having severe headaches and otc Tylenol is not helping, she complains of dizzness and productive cough/congestion. Patient states she understands that it usually runs a course of 7-10 days but she has to be at work on Monday and she is concerned that symptoms will only continue to get worse over weekend. KW

## 2015-01-10 NOTE — Telephone Encounter (Signed)
Opened in error

## 2015-01-10 NOTE — Telephone Encounter (Signed)
Add two Aleve twice daily for headaches. Remember the cough syrup also is a narcotic pain medication.

## 2015-01-14 NOTE — Telephone Encounter (Signed)
Spoke with patient to update and see how she is doing, she states that she did not take Aleve due to acid reflux. She states that she continued cough syrup and that her symptoms have improved and she is returning back to work today. KW

## 2015-01-23 ENCOUNTER — Ambulatory Visit: Payer: Managed Care, Other (non HMO) | Admitting: Family Medicine

## 2015-02-20 ENCOUNTER — Ambulatory Visit: Payer: Managed Care, Other (non HMO) | Admitting: Family Medicine

## 2015-02-25 ENCOUNTER — Telehealth: Payer: Self-pay

## 2015-02-25 MED ORDER — CLONAZEPAM 1 MG PO TABS: ORAL_TABLET | ORAL | Status: DC

## 2015-02-25 NOTE — Telephone Encounter (Signed)
FAx from [facility] pharmacy requesting refill for Clonazepam. Please review-aa Fax states 1 mg take 1 to 2 tablets at bedtime, we have 0.5 mg at bedtime. Which one to fill if you approve it?-aa

## 2015-02-25 NOTE — Telephone Encounter (Signed)
RX called in-aa 

## 2015-02-25 NOTE — Telephone Encounter (Signed)
#  [id number]

## 2015-06-23 ENCOUNTER — Other Ambulatory Visit: Payer: Self-pay

## 2015-06-23 MED ORDER — CLONAZEPAM 1 MG PO TABS: ORAL_TABLET | ORAL | Status: DC

## 2015-06-23 NOTE — Telephone Encounter (Signed)
Fax refill from [facility] for [employer]-aa

## 2015-08-26 ENCOUNTER — Other Ambulatory Visit: Payer: Self-pay

## 2015-08-27 ENCOUNTER — Ambulatory Visit (INDEPENDENT_AMBULATORY_CARE_PROVIDER_SITE_OTHER): Payer: Self-pay | Admitting: Family Medicine

## 2015-08-27 VITALS — BP 118/64 | HR 64 | Resp 16 | Wt 131.0 lb

## 2015-08-27 DIAGNOSIS — F419 Anxiety disorder, unspecified: Secondary | ICD-10-CM

## 2015-08-27 DIAGNOSIS — F909 Attention-deficit hyperactivity disorder, unspecified type: Secondary | ICD-10-CM

## 2015-08-27 DIAGNOSIS — F988 Other specified behavioral and emotional disorders with onset usually occurring in childhood and adolescence: Secondary | ICD-10-CM

## 2015-08-27 DIAGNOSIS — F321 Major depressive disorder, single episode, moderate: Secondary | ICD-10-CM

## 2015-08-27 MED ORDER — AMPHETAMINE-DEXTROAMPHETAMINE 20 MG PO TABS: 20.0000 mg | ORAL_TABLET | Freq: Two times a day (BID) | ORAL | 0 refills | Status: DC

## 2015-08-27 MED ORDER — CLONAZEPAM 1 MG PO TABS: ORAL_TABLET | ORAL | 5 refills | Status: DC

## 2015-08-27 NOTE — Progress Notes (Signed)
Subjective:  HPI  Patient is here for follow up. ADD: she is working at home and wanted to discuss trying Adderall not ER like she was taking before. Just enough to get her focus on the computer when she is working.  Depression/anxiety: Symptoms are stable with using just Clonazepam 1 tablet at bedtime usually, some times she will take 1 1/2 to 2 tablets but not often. No GI symptoms.  Prior to Admission medications   Medication Sig Start Date End Date Taking? Authorizing Provider  APPLE CIDER VINEGAR PO Take by mouth.   Yes Historical Provider, MD  Ascorbic Acid (VITAMIN C) 1000 MG tablet Take 1,000 mg by mouth daily.   Yes Historical Provider, MD  clonazePAM Scarlette Calico(KLONOPIN) 1 MG tablet 1 to 2 tablets daily as needed 06/23/15  Yes Shantell Belongia Hulen ShoutsL Aiken Withem Jr., MD  Turmeric 450 MG CAPS Take 1 capsule by mouth daily.   Yes Historical Provider, MD  vitamin B-12 (CYANOCOBALAMIN) 1000 MCG tablet Take 1,000 mcg by mouth daily.   Yes Historical Provider, MD    Patient Active Problem List   Diagnosis Date Noted  . ADD (attention deficit disorder) 08/16/2014  . Cigarette smoker 08/16/2014  . Depression, major, single episode, moderate (HCC) 08/16/2014  . Anxiety, generalized 08/16/2014  . Acid reflux 08/16/2014  . Irregular bleeding 08/16/2014  . Cannot sleep 08/16/2014  . Headache, migraine 08/16/2014  . Gastroduodenal ulcer 08/16/2014  . Anxiety 06/18/2014  . NONSPECIFIC ABNORM RESULTS OTH SPEC FUNCT STUDY 09/28/2007    Past Medical History:  Diagnosis Date  . Anxiety   . Fibromyalgia   . GERD (gastroesophageal reflux disease)     Social History   Social History  . Marital status: Single    Spouse name: N/A  . Number of children: N/A  . Years of education: N/A   Occupational History  . Not on file.   Social History Main Topics  . Smoking status: Current Every Day Smoker    Packs/day: 0.50  . Smokeless tobacco: Not on file     Comment: 5-6 cigarettes daily  . Alcohol use Yes      Comment: 2-3 drinks a week.  . Drug use: No  . Sexual activity: Not on file   Other Topics Concern  . Not on file   Social History Narrative  . No narrative on file    Allergies  Allergen Reactions  . Iodinated Diagnostic Agents Itching    Omnpaque i 300, 10 min delayed reaction of large hives, itching & urticaria// will require 13hr prep for future iv contrast,//a.calhoun  . Dilaudid [Hydromorphone]     Hard times breathing    Review of Systems  Constitutional: Negative.   Respiratory: Negative.   Cardiovascular: Negative.   Gastrointestinal: Negative.   Musculoskeletal: Negative.   Psychiatric/Behavioral: Negative.        Stable on medication.     Objective:  BP 118/64   Pulse 64   Resp 16   Wt 131 lb (59.4 kg)   BMI 21.80 kg/m   Physical Exam  Constitutional: She is oriented to person, place, and time and well-developed, well-nourished, and in no distress.  HENT:  Head: Normocephalic and atraumatic.  Right Ear: External ear normal.  Left Ear: External ear normal.  Eyes: Conjunctivae are normal. Pupils are equal, round, and reactive to light.  Neck: Normal range of motion. Neck supple.  Cardiovascular: Normal rate, regular rhythm, normal heart sounds and intact distal pulses.   No murmur heard. Pulmonary/Chest:  Effort normal and breath sounds normal. No respiratory distress. She has no wheezes.  Musculoskeletal: She exhibits no edema or tenderness.  Neurological: She is alert and oriented to person, place, and time.  Psychiatric: Mood, memory, affect and judgment normal.    Lab Results  Component Value Date   WBC 7.4 12/18/2014   HGB 14.0 12/18/2014   HCT 40.8 12/18/2014   PLT 245 12/18/2014   GLUCOSE 102 (H) 12/18/2014   TSH 2.58 03/07/2013    CMP     Component Value Date/Time   NA 137 12/18/2014 1902   NA 140 03/07/2013   K 3.5 12/18/2014 1902   CL 106 12/18/2014 1902   CO2 24 12/18/2014 1902   GLUCOSE 102 (H) 12/18/2014 1902   BUN 7  12/18/2014 1902   BUN 10 03/07/2013   CREATININE 0.79 12/18/2014 1902   CALCIUM 9.1 12/18/2014 1902   PROT 6.9 12/18/2014 1902   ALBUMIN 4.0 12/18/2014 1902   AST 46 (H) 12/18/2014 1902   ALT 76 (H) 12/18/2014 1902   ALKPHOS 48 12/18/2014 1902   BILITOT 1.4 (H) 12/18/2014 1902   GFRNONAA >60 12/18/2014 1902   GFRAA >60 12/18/2014 1902    Assessment and Plan :  1. ADD (attention deficit disorder) Will provide Adderall not XR to help patient as needed.   Adderall 20 mg twice a day prescription when necessary, #60, 3 refills. 2. Anxiety Stable. Discussed with patient trying to get off the Clonazepam or at least to cut back on how much and how often she is taking. Discussed sleep hygiene.  3. Depression, major, single episode, moderate (HCC) Stable.In remission.  Patient was seen and examined by Dr. Bosie Closichard L Leveta Wahab and note was scribed by Samara DeistAnastasiya Aleksandrova, RMA.    Julieanne Mansonichard Marites Nath MD [facility] 08/27/2015 2:50 PM

## 2015-12-24 ENCOUNTER — Other Ambulatory Visit: Payer: Self-pay | Admitting: Family Medicine

## 2015-12-24 NOTE — Telephone Encounter (Signed)
Pt needs refill  amphetamine-dextroamphetamine (ADDERALL) 20 MG tablet  Please call [phone]  Thanks Barth Kirkseri

## 2015-12-24 NOTE — Telephone Encounter (Signed)
Pt does not have appt scheduled and last appt was in august. Please advise.

## 2015-12-25 MED ORDER — AMPHETAMINE-DEXTROAMPHETAMINE 20 MG PO TABS: 20.0000 mg | ORAL_TABLET | Freq: Two times a day (BID) | ORAL | 0 refills | Status: DC

## 2015-12-25 NOTE — Telephone Encounter (Signed)
rx placed up front, pt advised-aa 

## 2015-12-25 NOTE — Telephone Encounter (Signed)
1 month rf. 

## 2016-01-12 ENCOUNTER — Ambulatory Visit (INDEPENDENT_AMBULATORY_CARE_PROVIDER_SITE_OTHER): Payer: BLUE CROSS/BLUE SHIELD | Admitting: Family Medicine

## 2016-01-12 VITALS — BP 98/64 | HR 76 | Temp 98.4°F | Resp 18 | Wt 124.0 lb

## 2016-01-12 DIAGNOSIS — J0101 Acute recurrent maxillary sinusitis: Secondary | ICD-10-CM | POA: Diagnosis not present

## 2016-01-12 DIAGNOSIS — F908 Attention-deficit hyperactivity disorder, other type: Secondary | ICD-10-CM | POA: Diagnosis not present

## 2016-01-12 DIAGNOSIS — F411 Generalized anxiety disorder: Secondary | ICD-10-CM | POA: Diagnosis not present

## 2016-01-12 DIAGNOSIS — R634 Abnormal weight loss: Secondary | ICD-10-CM

## 2016-01-12 DIAGNOSIS — F321 Major depressive disorder, single episode, moderate: Secondary | ICD-10-CM | POA: Diagnosis not present

## 2016-01-12 DIAGNOSIS — M797 Fibromyalgia: Secondary | ICD-10-CM | POA: Insufficient documentation

## 2016-01-12 DIAGNOSIS — G43919 Migraine, unspecified, intractable, without status migrainosus: Secondary | ICD-10-CM | POA: Diagnosis not present

## 2016-01-12 DIAGNOSIS — G4709 Other insomnia: Secondary | ICD-10-CM

## 2016-01-12 MED ORDER — AMPHETAMINE-DEXTROAMPHETAMINE 20 MG PO TABS: 20.0000 mg | ORAL_TABLET | Freq: Two times a day (BID) | ORAL | 0 refills | Status: DC

## 2016-01-12 MED ORDER — AMOXICILLIN-POT CLAVULANATE 875-125 MG PO TABS: 1.0000 | ORAL_TABLET | Freq: Two times a day (BID) | ORAL | 0 refills | Status: DC

## 2016-01-12 NOTE — Progress Notes (Signed)
Krystal Marks  MRN: [id number] DOB: [birth year]  Subjective:  HPI  Patient is here for follow up and discuss a few issues. She is taking Adderall 20 mg 1 1/2 tablets on the days of work when she needs it. She is taking klonopin 1/2 tablet at bedtime and sometimes has to take 1 whole tablet. She is still having some drainage and cough productive but much better then it was, she has noticed some sinus pain, below eyes. She is not sure with these symptoms if it is ok to have a flu shot.  She was diagnosed with fibromyalgia in 2016 and has records with her today. In January 2017 patient was advised to see a rheumatologist. Patient has insurance now and would like to proceed with this plan and discuss this with you. Current symptoms are severe fatigue, feeling worn out (example; went to a baby shower, drove there and by the time she got home she had to have help just to take her jeans off because she felt so exhausted/worn out), dizziness, blurred vision all the time and she knows she is due for eye exam-she was told she was on verge of bifocals last time, memory is worsening and is having hard time getting words out ( she knows what she is trying to say but having hard time saying it), feels in the fog with her thinking, migraines are worse and accommodated with neck pain, she is having pain specifically in the lower back on the right side and right back shoulder, knee pain lately is present and knees feeling like they are giving out, abnormal balance. Depression, anxiety, insomnia present. No shortness of breath. Symptoms are worsening to the point it is affecting her sleep. She does try to have a regular sleeping schedule.  She has been trying natural resources Magnesium oil, ordered CBD oil, taking Orchard blend and Omega blend by juice plus. Some symptoms have improved with this regimen.  She also mentions that she has discussed her heart beat before with Dr Rosanna Randy and that at times would have  flutters. She has noticed that she is having stabbing pain under her rib cage-in the middle, constantly. This can occur with her laying down and resting.  Wt Readings from Last 3 Encounters:  01/12/16 124 lb (56.2 kg)  08/27/15 131 lb (59.4 kg)  01/08/15 150 lb 12.8 oz (68.4 kg)   Depression screen PHQ 2/9 01/12/2016  Decreased Interest 2  Down, Depressed, Hopeless 1  PHQ - 2 Score 3  Altered sleeping 2  Tired, decreased energy 3  Change in appetite 1  Feeling bad or failure about yourself  0  Trouble concentrating 0  Moving slowly or fidgety/restless 3  Suicidal thoughts 0  PHQ-9 Score 12  Difficult doing work/chores Very difficult    Patient Active Problem List   Diagnosis Date Noted  . ADD (attention deficit disorder) 08/16/2014  . Cigarette smoker 08/16/2014  . Depression, major, single episode, moderate ([place]) 08/16/2014  . Anxiety, generalized 08/16/2014  . Acid reflux 08/16/2014  . Irregular bleeding 08/16/2014  . Cannot sleep 08/16/2014  . Headache, migraine 08/16/2014  . Gastroduodenal ulcer 08/16/2014  . Anxiety 06/18/2014  . NONSPECIFIC ABNORM RESULTS OTH SPEC FUNCT STUDY 09/28/2007    Past Medical History:  Diagnosis Date  . Anxiety   . Fibromyalgia   . GERD (gastroesophageal reflux disease)     Social History   Social History  . Marital status: Single    Spouse name: N/A  . Number  of children: N/A  . Years of education: N/A   Occupational History  . Not on file.   Social History Main Topics  . Smoking status: Current Every Day Smoker    Packs/day: 0.50  . Smokeless tobacco: Not on file     Comment: 5-6 cigarettes daily  . Alcohol use Yes     Comment: 2-3 drinks a week.  . Drug use: No  . Sexual activity: Not on file   Other Topics Concern  . Not on file   Social History Narrative  . No narrative on file    Outpatient Encounter Prescriptions as of 01/12/2016  Medication Sig Note  . amphetamine-dextroamphetamine (ADDERALL) 20 MG tablet  Take 1 tablet (20 mg total) by mouth 2 (two) times daily.   Marland Kitchen amphetamine-dextroamphetamine (ADDERALL) 20 MG tablet Take 1 tablet (20 mg total) by mouth 2 (two) times daily.   . APPLE CIDER VINEGAR PO Take by mouth.   . clonazePAM (KLONOPIN) 1 MG tablet 1/2 to 1 tablet daily as needed   . vitamin B-12 (CYANOCOBALAMIN) 1000 MCG tablet Take 1,000 mcg by mouth daily.   . Ascorbic Acid (VITAMIN C) 1000 MG tablet Take 1,000 mg by mouth daily.   . Turmeric 450 MG CAPS Take 1 capsule by mouth daily. 08/27/2015: prn   No facility-administered encounter medications on file as of 01/12/2016.     Allergies  Allergen Reactions  . Iodinated Diagnostic Agents Itching    Omnpaque i 300, 10 min delayed reaction of large hives, itching & urticaria// will require 13hr prep for future iv contrast,//a.calhoun  . Dilaudid [Hydromorphone]     Hard times breathing    Review of Systems  Constitutional: Positive for malaise/fatigue.       Worn out very easy, hot flashes, decreased appetite.  HENT: Positive for sinus pain.        Drainage  Eyes: Positive for blurred vision.  Respiratory: Positive for cough and sputum production. Negative for shortness of breath.   Cardiovascular: Negative.   Gastrointestinal: Positive for abdominal pain (under rib cage more).  Musculoskeletal: Positive for back pain, joint pain (shoulder pain), myalgias and neck pain.       Unsteady balance, knees feel like they giving out.  Neurological: Positive for dizziness, weakness and headaches.       Difficulty saying words that she is thinking off and is trying to say.  Psychiatric/Behavioral: Positive for depression (upset) and memory loss. The patient is nervous/anxious and has insomnia.     Objective:  BP 98/64   Pulse 76   Temp 98.4 F (36.9 C)   Resp 18   Wt 124 lb (56.2 kg)   LMP 01/06/2016 (Exact Date)   SpO2 96%   BMI 20.63 kg/m   Physical Exam  Constitutional: She is oriented to person, place, and time and  well-developed, well-nourished, and in no distress.  HENT:  Head: Normocephalic and atraumatic.  Nose: Right sinus exhibits maxillary sinus tenderness. Left sinus exhibits maxillary sinus tenderness.  Eyes: Conjunctivae are normal. Pupils are equal, round, and reactive to light.  Neck: Normal range of motion. Neck supple.  Cardiovascular: Normal rate, regular rhythm, normal heart sounds and intact distal pulses.   No murmur heard. Pulmonary/Chest: Effort normal and breath sounds normal. No respiratory distress. She has no wheezes.  Abdominal: Soft. She exhibits no distension. There is tenderness (mild epigastric pain).  Musculoskeletal: She exhibits no edema.  Neurological: She is alert and oriented to person, place, and time. She  displays facial symmetry. No cranial nerve deficit.  Psychiatric: Judgment normal.    Assessment and Plan :  1. Depression, major, single episode, moderate (HCC) PHQ9 score today is 12. Check labs. Follow. - CBC w/Diff/Platelet - Comprehensive metabolic panel - TSH Patient would probably benefit from psychiatric referral at some point in time. 2. Anxiety, generalized - CBC w/Diff/Platelet - Comprehensive metabolic panel - TSH  3. Other insomnia - TSH  4. Intractable migraine without status migrainosus, unspecified migraine type Worsening. - Sed Rate (ESR)  5. Attention deficit hyperactivity disorder (ADHD), other type Refills given x 3.  6. Fibromyalgia Diagnosed 2016. Refer to rheumatologist for further work up. Symptoms are worsening. Offered medications to help with her symptoms but patient declines at this time, she wants to try natural supplements. - TSH - Ambulatory referral to Rheumatology  7. Weight loss, unintentional Probably due to health issues/pain she is having. Follow for now. Advised patient that I would like for her to gain some weight. HPI, Exam and A&P transcribed under direction and in the presence of Miguel Aschoff,  MD.

## 2016-01-14 ENCOUNTER — Telehealth: Payer: Self-pay | Admitting: Family Medicine

## 2016-01-14 MED ORDER — DULOXETINE HCL 30 MG PO CPEP: 30.0000 mg | ORAL_CAPSULE | Freq: Every day | ORAL | 5 refills | Status: DC

## 2016-01-14 NOTE — Telephone Encounter (Signed)
RX sent to pharmacy. Patient advised.  

## 2016-01-14 NOTE — Telephone Encounter (Signed)
Pt was in Monday and was referred out to a rheumatology doctor for her fibromalagy.    She said you both had talked about medication, but she declined at that time.  She now wants to try something.  She would like something [facility] but she needs something that is not very expensive.    Pt's call back [phone]  Thanks TEri

## 2016-01-14 NOTE — Telephone Encounter (Signed)
Please review-aa 

## 2016-01-14 NOTE — Telephone Encounter (Signed)
Duloxetine 30mg  daily should be cheaper than Lyrica. #30,5rf

## 2016-01-15 ENCOUNTER — Telehealth: Payer: Self-pay | Admitting: Family Medicine

## 2016-01-15 NOTE — Telephone Encounter (Signed)
The doctor you wanted her to see wont take patient and does not treat fibromyalgia. Patient called though and states she found a doctor and Maralyn SagoSarah will talk to her and try to get her set up with that doctor-aa

## 2016-01-15 NOTE — Telephone Encounter (Signed)
Ok thanks 

## 2016-01-15 NOTE — Telephone Encounter (Signed)
I have been unable to find a rheumatologist that will treat fibromyalgia.I was told by the [facility]ology department at [facility] that pt's with fibromyalgia are referred to physical medicine and rehab (Phone [phone])The patient is agreeable to this unless you have another suggestion

## 2016-01-16 LAB — BASIC METABOLIC PANEL
BUN: 11 mg/dL (ref 4–21)
CREATININE: 0.8 mg/dL (ref 0.5–1.1)
GLUCOSE: 95 mg/dL
Potassium: 4.3 mmol/L (ref 3.4–5.3)
SODIUM: 138 mmol/L (ref 137–147)

## 2016-01-16 LAB — HEPATIC FUNCTION PANEL
ALK PHOS: 45 U/L (ref 25–125)
ALT: 10 U/L (ref 7–35)
AST: 15 U/L (ref 13–35)
Bilirubin, Total: 2.6 mg/dL

## 2016-01-16 LAB — CBC AND DIFFERENTIAL
HCT: 41 % (ref 36–46)
Hemoglobin: 13.9 g/dL (ref 12.0–16.0)
Neutrophils Absolute: 57 /uL
PLATELETS: 234 10*3/uL (ref 150–399)
WBC: 5.3 10^3/mL

## 2016-01-16 LAB — TSH: TSH: 1.81 u[IU]/mL (ref 0.41–5.90)

## 2016-01-27 ENCOUNTER — Ambulatory Visit: Payer: BLUE CROSS/BLUE SHIELD | Admitting: Emergency Medicine

## 2016-01-27 ENCOUNTER — Telehealth: Payer: Self-pay | Admitting: Emergency Medicine

## 2016-01-27 DIAGNOSIS — Z23 Encounter for immunization: Secondary | ICD-10-CM

## 2016-01-27 NOTE — Telephone Encounter (Signed)
Labs all normal. I don't know anything about those tests. I'm disappointed there is no specialist to treat  fibromyalgia.

## 2016-01-27 NOTE — Telephone Encounter (Signed)
Pt came in today to get her flu vaccine and wanted to know about her lab results. They are in the labs tab but I'm not sure if you saw them because they were from another lab ( other than [facility]). She also wanted to let you know that Maralyn SagoSarah and pt could not find a rheumatologist around her that treated fibromyalgia. So she researched it and [place] at [facility], Dr, Philis Kendallave Schwartz. He recommended 2 test one is [facility]: OAT test. She said this was $450 out of pocket and another test DUTCH sex and adrenal Hormone test which is $350. She was wanting to know if you knew anything about these test and what diagnoses she might could get these paid by insurance for? Please advise. I have a copy of the order he gave her if you would like to see it. Thanks.

## 2016-01-28 NOTE — Telephone Encounter (Signed)
We can treat the fibromyalgia but I don't know another specialist around that does it.

## 2016-01-28 NOTE — Telephone Encounter (Signed)
Advised patient as below. Patient wants to know is there anyone else that you recommend? Please advise. Thanks!

## 2016-01-30 NOTE — Telephone Encounter (Signed)
Pt advised-aa 

## 2016-01-30 NOTE — Telephone Encounter (Signed)
lmtcb-aa 

## 2016-02-27 ENCOUNTER — Other Ambulatory Visit: Payer: Self-pay | Admitting: Family Medicine

## 2016-02-27 NOTE — Telephone Encounter (Signed)
Pharmacy requesting refills. Thanks!  

## 2016-03-13 IMAGING — US US ABDOMEN LIMITED
1 series · 14 of 25 positions shown · non-contrast
Comparison: CT abdomen and pelvis 01/19/2012

CLINICAL DATA: Severe epigastric pain radiating to left upper
quadrant and right upper quadrant. Loose bowel movements this
morning. Dizziness and tingling in the hands starting at 4 p.m..

EXAM:
US ABDOMEN LIMITED - RIGHT UPPER QUADRANT

[Series 1: us abdomen limited · 0.17mm/px · 14 of 49 slices shown]
[im 1/49]
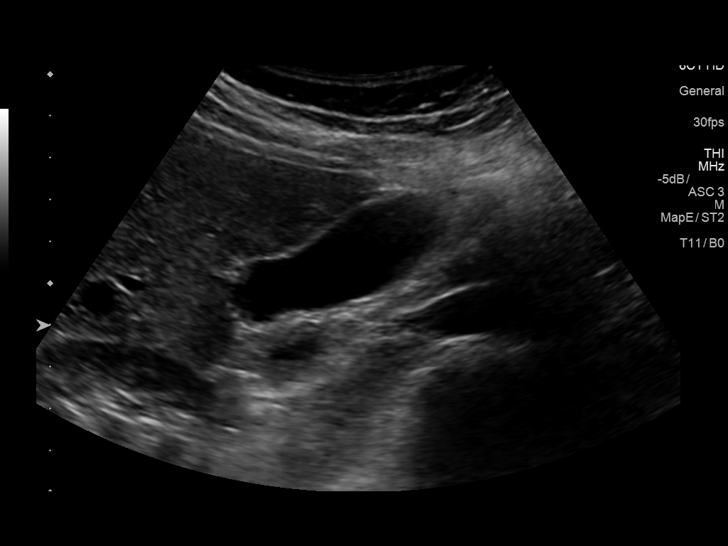
[im 5/49]
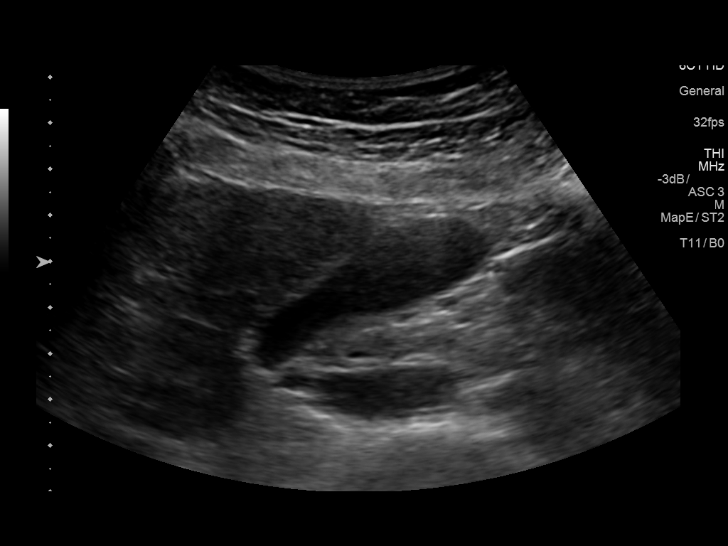
[im 9/49]
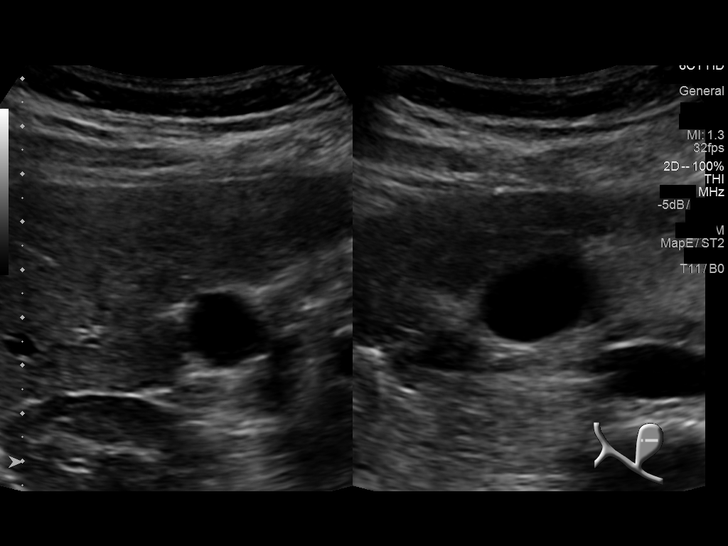
[im 13/49]
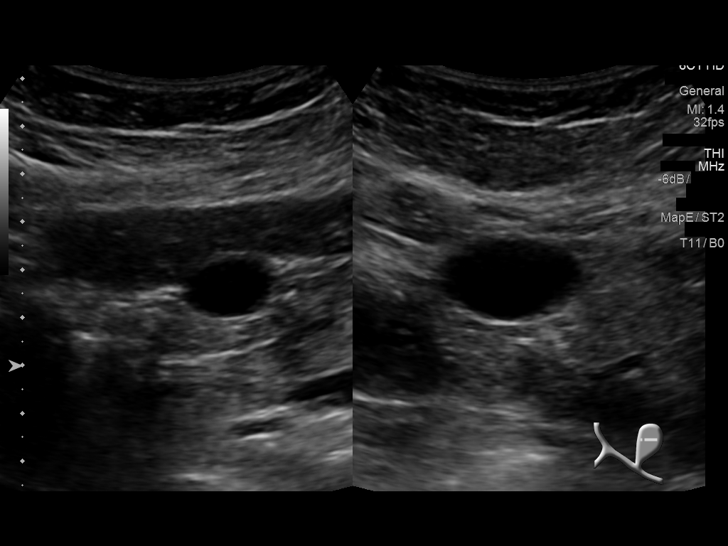
[im 17/49]
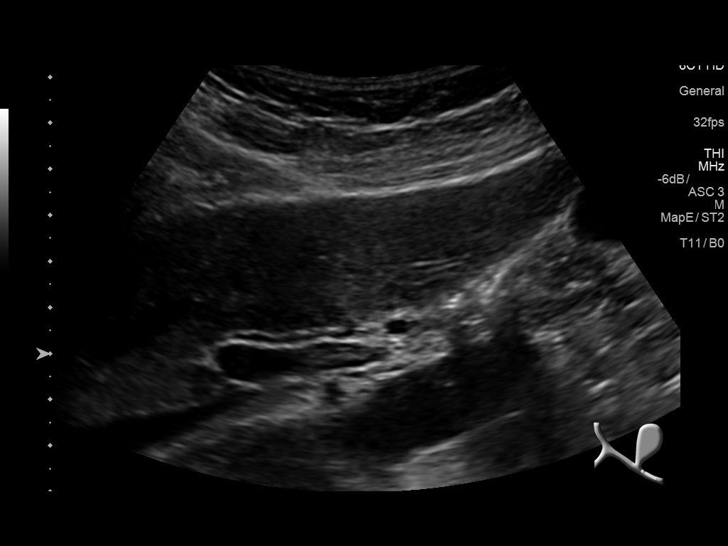
[im 19/49]
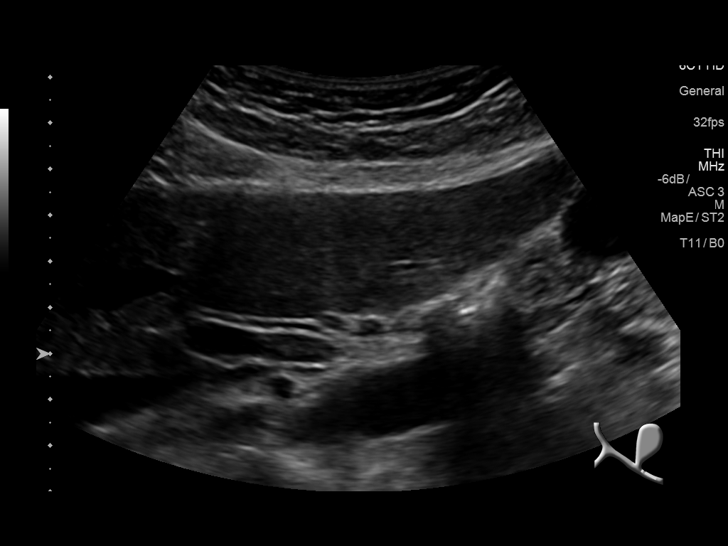
[im 23/49]
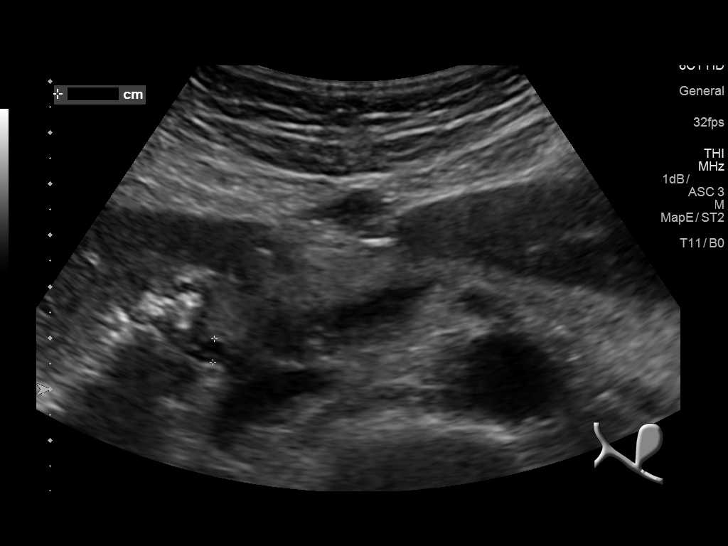
[im 27/49]
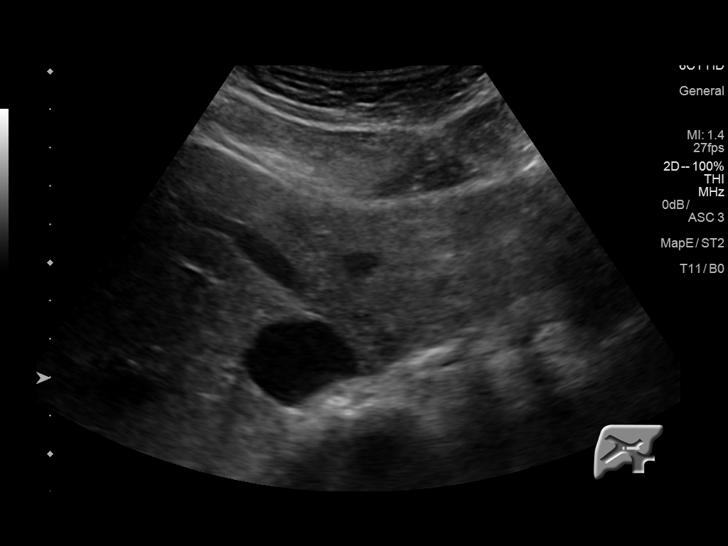
[im 31/49]
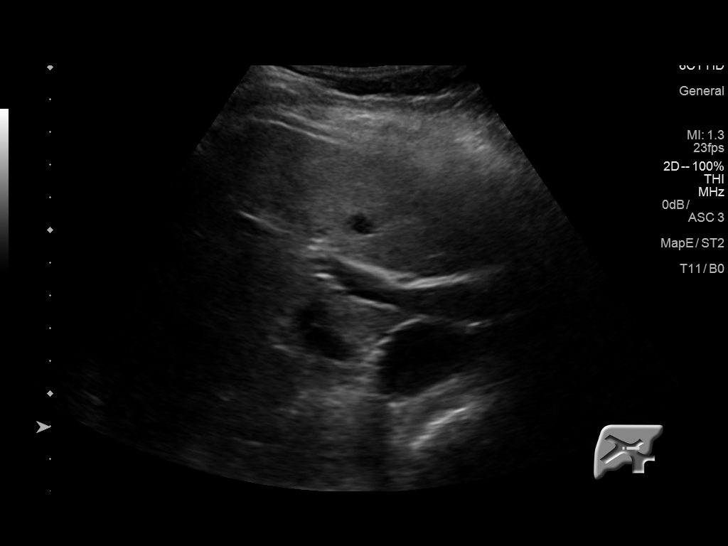
[im 33/49]
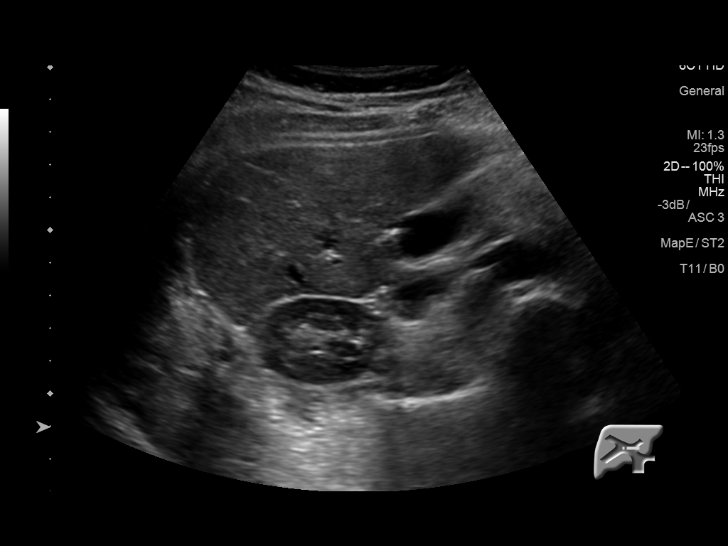
[im 37/49]
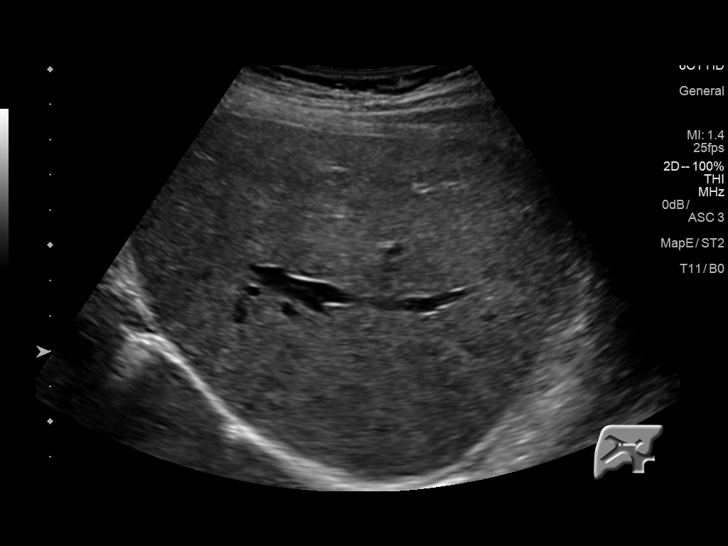
[im 41/49]
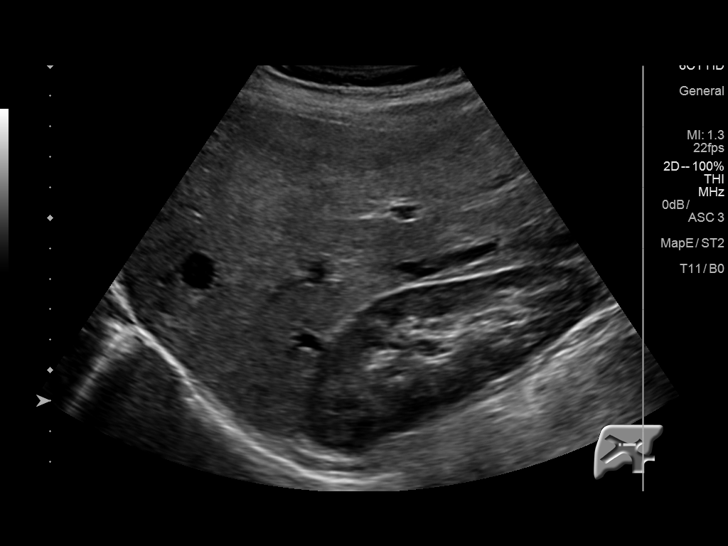
[im 45/49]
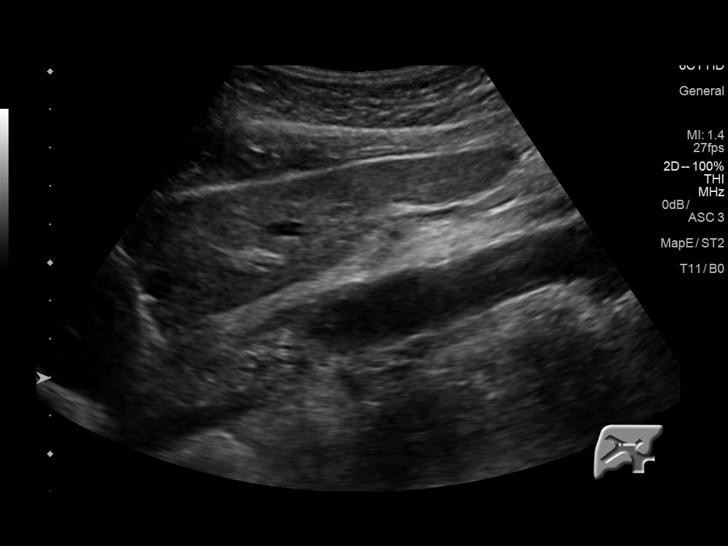
[im 49/49]
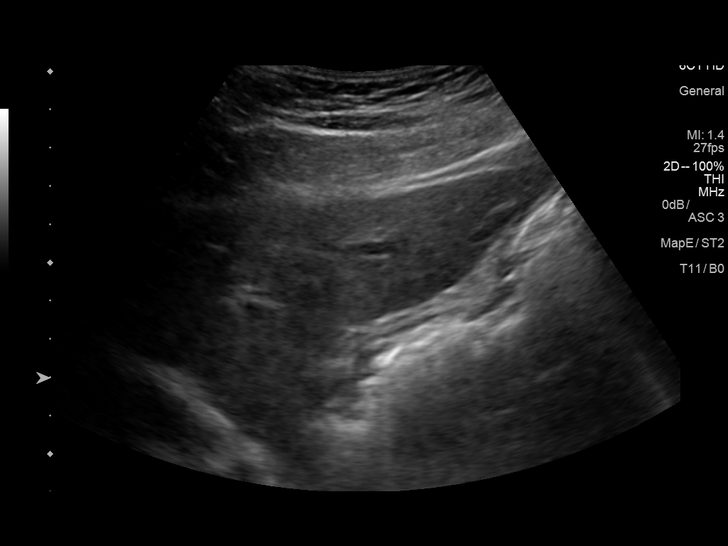

[14 of 25 positions shown; findings below may reference images not displayed]

FINDINGS: Gallbladder:

No gallstones or wall thickening visualized. No sonographic Murphy
sign noted.

Common bile duct:

Diameter: 2.2 mm, normal

Liver:

No focal lesion identified. Within normal limits in parenchymal
echogenicity.
IMPRESSION: No evidence of cholelithiasis or cholecystitis.

## 2016-03-19 ENCOUNTER — Encounter: Payer: Self-pay | Admitting: Family Medicine

## 2016-04-12 ENCOUNTER — Ambulatory Visit: Payer: BLUE CROSS/BLUE SHIELD | Admitting: Family Medicine

## 2016-04-28 ENCOUNTER — Ambulatory Visit (INDEPENDENT_AMBULATORY_CARE_PROVIDER_SITE_OTHER): Payer: BLUE CROSS/BLUE SHIELD | Admitting: Family Medicine

## 2016-04-28 ENCOUNTER — Encounter: Payer: Self-pay | Admitting: Family Medicine

## 2016-04-28 VITALS — BP 108/54 | HR 84 | Temp 98.2°F | Resp 16 | Wt 127.0 lb

## 2016-04-28 DIAGNOSIS — M797 Fibromyalgia: Secondary | ICD-10-CM

## 2016-04-28 DIAGNOSIS — F908 Attention-deficit hyperactivity disorder, other type: Secondary | ICD-10-CM | POA: Diagnosis not present

## 2016-04-28 MED ORDER — AMPHETAMINE-DEXTROAMPHETAMINE 20 MG PO TABS: 20.0000 mg | ORAL_TABLET | Freq: Two times a day (BID) | ORAL | 0 refills | Status: DC

## 2016-04-28 NOTE — Progress Notes (Signed)
Patient: Jacque Byron Female    DOB: 03/31/1975   41 y.o.   MRN: [id number] Visit Date: 04/28/2016  Today's Provider: Megan Mans, MD   Chief Complaint  Patient presents with  . ADHD  . Fibromyalgia   Subjective:    HPI     Follow up for ADHD  The patient was last seen for this 3 months ago. Changes made at last visit include refilling Adderall.  She reports excellent compliance with treatment. She feels that condition is Improved. Pt reports she is taking 2 tablets po in the mornings, and the medication will sometimes wear off after 5-6 hours. Pt would not like to take the extended release, as it causes insomnia. Pt is asking is she can take an extra 10-20 mg on the days that she needs it. She is not having side effects.   ------------------------------------------------------------------------------------   Follow up for Fibromyalgia  The patient was last seen for this 3 months ago. Changes made at last visit include adding Duloxetine. No specialists will treat fibromyalgia.  Pt was taking Imitrex with Duloxetine, which caused vomiting. Pt D/C the Duloxetine. Pt is now seeing [facility] for the fibromyalgia. [facility] is performing chiropractic manipulations. Pt is also using CBD oil with frankincense for this. Pt feels the fibromyalgia is 75% improved.  ------------------------------------------------------------------------------------    Allergies  Allergen Reactions  . Duloxetine Nausea And Vomiting    CAN NOT TAKE WITH IMITREX   . Iodinated Diagnostic Agents Itching    Omnpaque i 300, 10 min delayed reaction of large hives, itching & urticaria// will require 13hr prep for future iv contrast,//a.calhoun  . Dilaudid [Hydromorphone]     Hard times breathing     Current Outpatient Prescriptions:  .  amphetamine-dextroamphetamine (ADDERALL) 20 MG tablet, Take 1 tablet (20 mg total) by mouth 2 (two) times daily., Disp: 60 tablet,  Rfl: 0 .  APPLE CIDER VINEGAR PO, Take by mouth., Disp: , Rfl:  .  clonazePAM (KLONOPIN) 1 MG tablet, TAKE 1/2 TO 1 TABLET DAILY AS NEEDED, Disp: 30 tablet, Rfl: 5 .  loratadine (CLARITIN) 10 MG tablet, Take 10 mg by mouth daily., Disp: , Rfl:  .  UNABLE TO FIND, Apply 1 application topically at bedtime. Med Name: CBD oil with Frankincense, Disp: , Rfl:  .  vitamin B-12 (CYANOCOBALAMIN) 1000 MCG tablet, Take 1,000 mcg by mouth daily., Disp: , Rfl:  .  acyclovir (ZOVIRAX) 400 MG tablet, Take 1 tablet by mouth as needed., Disp: , Rfl: 1 .  SUMAtriptan (IMITREX) 100 MG tablet, Take 1 tablet by mouth as needed., Disp: , Rfl: 11 .  Turmeric 450 MG CAPS, Take 1 capsule by mouth daily., Disp: , Rfl:   Review of Systems  Constitutional: Positive for fatigue. Negative for activity change, appetite change, chills, diaphoresis, fever and unexpected weight change.  Psychiatric/Behavioral: Negative for decreased concentration.    Social History  Substance Use Topics  . Smoking status: Current Every Day Smoker    Packs/day: 0.50  . Smokeless tobacco: Never Used     Comment: 5-6 cigarettes daily  . Alcohol use 2.4 oz/week    4 Glasses of wine per week   Objective:   BP (!) 108/54 (BP Location: Right Arm, Patient Position: Sitting, Cuff Size: Normal)   Pulse 84   Temp 98.2 F (36.8 C) (Oral)   Resp 16   Wt 127 lb (57.6 kg)   LMP 04/22/2016   BMI 21.13 kg/m  Vitals:   04/28/16 0817  BP: (!) 108/54  Pulse: 84  Resp: 16  Temp: 98.2 F (36.8 C)  TempSrc: Oral  Weight: 127 lb (57.6 kg)     Physical Exam  Constitutional: She appears well-developed and well-nourished.  HENT:  Head: Normocephalic.  Eyes: Conjunctivae are normal.  Neck: Normal range of motion.  Cardiovascular: Normal rate, regular rhythm and normal heart sounds.   Pulmonary/Chest: Effort normal and breath sounds normal. No respiratory distress.  Musculoskeletal: Normal range of motion. She exhibits no edema.    Psychiatric: She has a normal mood and affect. Her behavior is normal.        Assessment & Plan:     1. Attention deficit hyperactivity disorder (ADHD), other type Advised pt to continue current dose. Do not increase. May need to refer to specialist if pt still feels the need to increase the dose. I am not willing to do this. - amphetamine-dextroamphetamine (ADDERALL) 20 MG tablet; Take 1 tablet (20 mg total) by mouth 2 (two) times daily.  Dispense: 60 tablet; Refill: 0  2. Fibromyalgia Improving. Continue to FU with [facility] for relief of sx.    Patient seen and examined by Julieanne Manson, MD, and note scribed by Allene Dillon, CMA.  Estefani Bateson Wendelyn Breslow, MD  [facility]

## 2016-07-28 ENCOUNTER — Encounter: Payer: Self-pay | Admitting: Physician Assistant

## 2016-07-28 ENCOUNTER — Ambulatory Visit (INDEPENDENT_AMBULATORY_CARE_PROVIDER_SITE_OTHER): Payer: BLUE CROSS/BLUE SHIELD | Admitting: Physician Assistant

## 2016-07-28 ENCOUNTER — Telehealth: Payer: Self-pay

## 2016-07-28 VITALS — BP 114/70 | HR 56 | Temp 97.8°F | Resp 16 | Wt 122.0 lb

## 2016-07-28 DIAGNOSIS — L409 Psoriasis, unspecified: Secondary | ICD-10-CM | POA: Diagnosis not present

## 2016-07-28 MED ORDER — CLOBETASOL PROPIONATE 0.05 % EX LIQD: CUTANEOUS | 0 refills | Status: DC

## 2016-07-28 NOTE — Patient Instructions (Signed)
Psoriasis  Psoriasis is a long-term (chronic) skin condition. It causes raised, red patches (plaques) on your skin that look silvery. The red patches may show up anywhere on your body. They can be any size or shape.  Psoriasis can come and go. It can range from mild to very bad. It cannot be passed from one person to another (not contagious). There is no cure for this condition, but it can be helped with treatment.  Follow these instructions at home:  Skin Care   Apply moisturizers to your skin as needed. Only use those that your doctor has said are okay.   Apply cool compresses to the affected areas.   Do not scratch your skin.  Lifestyle     Do not use tobacco products. This includes cigarettes, chewing tobacco, and e-cigarettes. If you need help quitting, ask your doctor.   Drink little or no alcohol.   Try to lower your stress. Meditation or yoga may help.   Get sun as told by your doctor. Do not get sunburned.   Think about joining a psoriasis support group.  Medicines   Take or use over-the-counter and prescription medicines only as told by your doctor.   If you were prescribed an antibiotic, take or use it as told by your doctor. Do not stop taking the antibiotic even if your condition starts to get better.  General instructions   Keep a journal. Use this to help track what triggers an outbreak. Try to avoid any triggers.   See a counselor or social worker if you feel very sad, upset, or hopeless about your condition and these feelings affect your work or relationships.   Keep all follow-up visits as told by your doctor. This is important.  Contact a doctor if:   Your pain gets worse.   You have more redness or warmth in the affected areas.   You have new pain or stiffness in your joints.   Your pain or stiffness in your joints gets worse.   Your nails start to break easily.   Your nails pull away from the nail bed easily.   You have a fever.   You feel very sad (depressed).  This  information is not intended to replace advice given to you by your health care provider. Make sure you discuss any questions you have with your health care provider.  Document Released: 01/29/2004 Document Revised: 05/29/2015 Document Reviewed: 05/08/2014  Elsevier Interactive Patient Education  2018 [employer] Inc[employer]

## 2016-07-28 NOTE — Telephone Encounter (Signed)
[facility]armac[facility] [address] called regarding patient. Pharmacist is asking if Clobetasol Propionate 0.05% external spray is safe to use for pregnant women because patient thinks she might be pregnant and that this medication also needs a PA. Please advise.  Thanks,  Joseline

## 2016-07-28 NOTE — Progress Notes (Signed)
Patient: Krystal Marks Female    DOB: 1975-10-10   41 y.o.   MRN: [id number] Visit Date: 07/28/2016  Today's Provider: Trey SailorsAdriana M Pollak, PA-C   Chief Complaint  Patient presents with  . Rash    Started a few weeks ago.   Subjective:      Krystal Marks is a 41 y/o Marks today presenting today for scalp rash. It has been present for about a month. Localized along her hairline on the front of her scalp. It is itchy, red, and flaky. No rash elsewhere such as on her elbows or knees. No fevers, chills. No pus drainage. No new products used. Used 2% salicylic acid shampoo with temporary relief.  Rash  This is a new problem. The current episode started 1 to 4 weeks ago. The problem has been gradually worsening since onset. The affected locations include the scalp. The rash is characterized by peeling, scaling, redness and itchiness. She was exposed to nothing. Pertinent negatives include no shortness of breath.       Allergies  Allergen Reactions  . Duloxetine Nausea And Vomiting    CAN NOT TAKE WITH IMITREX   . Iodinated Diagnostic Agents Itching    Omnpaque i 300, 10 min delayed reaction of large hives, itching & urticaria// will require 13hr prep for future iv contrast,//a.calhoun  . Dilaudid [Hydromorphone]     Hard times breathing     Current Outpatient Prescriptions:  .  acyclovir (ZOVIRAX) 400 MG tablet, Take 1 tablet by mouth as needed., Disp: , Rfl: 1 .  amphetamine-dextroamphetamine (ADDERALL) 20 MG tablet, Take 1 tablet (20 mg total) by mouth 2 (two) times daily., Disp: 60 tablet, Rfl: 0 .  APPLE CIDER VINEGAR PO, Take by mouth., Disp: , Rfl:  .  BLACK CURRANT SEED OIL PO, Take by mouth., Disp: , Rfl:  .  clonazePAM (KLONOPIN) 1 MG tablet, TAKE 1/2 TO 1 TABLET DAILY AS NEEDED, Disp: 30 tablet, Rfl: 5 .  co-enzyme Q-10 30 MG capsule, Take 30 mg by mouth 3 (three) times daily., Disp: , Rfl:  .  loratadine (CLARITIN) 10 MG tablet, Take 10 mg by mouth daily.,  Disp: , Rfl:  .  Nutritional Supplements (ADRENAL COMPLEX PO), Take by mouth., Disp: , Rfl:  .  UNABLE TO FIND, Place 1 application under the tongue at bedtime. Med Name: CBD oil with Frankincense , Disp: , Rfl:  .  vitamin B-12 (CYANOCOBALAMIN) 1000 MCG tablet, Take 1,000 mcg by mouth daily., Disp: , Rfl:  .  SUMAtriptan (IMITREX) 100 MG tablet, Take 1 tablet by mouth as needed., Disp: , Rfl: 11 .  Turmeric 450 MG CAPS, Take 1 capsule by mouth daily., Disp: , Rfl:   Review of Systems  Constitutional: Negative.   HENT: Positive for trouble swallowing.   Respiratory: Negative for shortness of breath.   Musculoskeletal: Negative.   Skin: Positive for rash. Negative for color change, pallor and wound.    Social History  Substance Use Topics  . Smoking status: Current Every Day Smoker    Packs/day: 0.50  . Smokeless tobacco: Never Used     Comment: 5-6 cigarettes daily  . Alcohol use 2.4 oz/week    4 Glasses of wine per week   Objective:   BP 114/70 (BP Location: Left Arm, Patient Position: Sitting, Cuff Size: Normal)   Pulse (!) 56   Temp 97.8 F (36.6 C) (Oral)   Resp 16   Wt 122 lb (55.3 kg)  BMI 20.30 kg/m  Vitals:   07/28/16 1521  BP: 114/70  Pulse: (!) 56  Resp: 16  Temp: 97.8 F (36.6 C)  TempSrc: Oral  Weight: 122 lb (55.3 kg)     Physical Exam  Constitutional: She is oriented to person, place, and time. She appears well-developed and well-nourished.  Neurological: She is alert and oriented to person, place, and time.  Skin: Skin is warm and dry. Rash noted. There is erythema.  Erythematous patches along anterior hair line with flaking.  Psychiatric: She has a normal mood and affect. Her behavior is normal.        Assessment & Plan:     1. Psoriasis  Sent in clobetasol 0.05 spray to try with instructions to stop if it makes rash worse. However, patient showed up at the pharmacy and said she might be pregnant. I cancelled the prescription and she should  proceed with dermatology referral.  Referral to derm. Patient says she has called dermatology already and cannot get in until September. Counseled that this is generally the wait time for dermatology appointment. Of[facility] with Dr. Ebony CargoBenitez-Graham as appts typically faster. Patient declines, doesn't want to drive to [place].  She asks if she can use natural treatments like apple cider vinegar and mashed up bananas. She can try if she wishes, stop if making it worse.  2. Scalp psoriasis  - Ambulatory referral to Dermatology  Return if symptoms worsen or fail to improve.  I have spent 25 minutes with this patient, >50% of which was spent on counseling and coordination of care.  The entirety of the information documented in the History of Present Illness, Review of Systems and Physical Exam were personally obtained by me. Portions of this information were initially documented by Kavin LeechLaura Walsh, CMA and reviewed by me for thoroughness and accuracy.        Trey SailorsAdriana M Pollak, PA-C  [facility]

## 2016-07-28 NOTE — Telephone Encounter (Signed)
If she's possibly pregnant, then I will not prescribe it. Proceed with derm referral. She should consult with Dr. Sullivan LoneGilbert about possible pregnancy and continuation of chronic medications. Thanks.

## 2016-07-29 NOTE — Telephone Encounter (Signed)
Pt advised.   Thanks,   -Aashika Carta  

## 2016-08-05 ENCOUNTER — Ambulatory Visit (INDEPENDENT_AMBULATORY_CARE_PROVIDER_SITE_OTHER): Payer: BLUE CROSS/BLUE SHIELD | Admitting: Family Medicine

## 2016-08-05 ENCOUNTER — Telehealth: Payer: Self-pay | Admitting: Physician Assistant

## 2016-08-05 VITALS — BP 96/72 | HR 90 | Temp 98.3°F | Resp 16 | Wt 121.0 lb

## 2016-08-05 DIAGNOSIS — R002 Palpitations: Secondary | ICD-10-CM

## 2016-08-05 DIAGNOSIS — H43399 Other vitreous opacities, unspecified eye: Secondary | ICD-10-CM | POA: Diagnosis not present

## 2016-08-05 DIAGNOSIS — F908 Attention-deficit hyperactivity disorder, other type: Secondary | ICD-10-CM | POA: Diagnosis not present

## 2016-08-05 DIAGNOSIS — R5383 Other fatigue: Secondary | ICD-10-CM | POA: Diagnosis not present

## 2016-08-05 MED ORDER — AMPHETAMINE-DEXTROAMPHETAMINE 20 MG PO TABS: 20.0000 mg | ORAL_TABLET | Freq: Two times a day (BID) | ORAL | 0 refills | Status: DC

## 2016-08-05 NOTE — Telephone Encounter (Signed)
[facility] request Krystal Marks's nurse to return her call because they have been requesting a PA for Clobetasol Propionate (TEMOVATE) 0.05 % external spray since 07/28/16 and haven't heard back.Please advise. Thanks TNP

## 2016-08-05 NOTE — Progress Notes (Signed)
Krystal Marks  MRN: [id number] DOB: [birth year]  Subjective:  HPI  Patient is here to discuss EEG report done by neurologist, this was ordered chiropractor due to some answers on the form she filled out at the [facility] office that prompted the EEG. 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 She has been having dizzy spells, sometimes in the shower sees white spots all over the shower wall.  BP Readings from Last 3 Encounters:  08/05/16 96/72  07/28/16 114/70  04/28/16 (!) 108/54   Patient Active Problem List   Diagnosis Date Noted  . Fibromyalgia 01/12/2016  . ADD (attention deficit disorder) 08/16/2014  . Cigarette smoker 08/16/2014  . Depression, major, single episode, moderate ([place]) 08/16/2014  . Anxiety, generalized 08/16/2014  . Acid reflux 08/16/2014  . Irregular bleeding 08/16/2014  . Cannot sleep 08/16/2014  . Headache, migraine 08/16/2014  . Gastroduodenal ulcer 08/16/2014  . Anxiety 06/18/2014  . NONSPECIFIC ABNORM RESULTS OTH SPEC FUNCT STUDY 09/28/2007    Past Medical History:  Diagnosis Date  . Anxiety   . Fibromyalgia   . GERD (gastroesophageal reflux disease)     Social History   Social History  . Marital status: Single    Spouse name: N/A  . Number of children: N/A  . Years of education: N/A   Occupational History  . Not on file.   Social History Main Topics  . Smoking status: Current Every Day Smoker    Packs/day: 0.50  . Smokeless tobacco: Never Used     Comment: 5-6 cigarettes daily  . Alcohol use 2.4 oz/week    4 Glasses of wine per week  . Drug use: No  . Sexual activity: Not on file   Other Topics Concern  . Not on file   Social History Narrative  . No narrative on file    Outpatient Encounter Prescriptions as of 08/05/2016  Medication Sig Note  . acyclovir (ZOVIRAX) 400 MG tablet Take 1 tablet by mouth as  needed.   Marland Kitchen amphetamine-dextroamphetamine (ADDERALL) 20 MG tablet Take 1 tablet (20 mg total) by mouth 2 (two) times daily.   . APPLE CIDER VINEGAR PO Take by mouth.   Marland Kitchen BLACK CURRANT SEED OIL PO Take by mouth.   . clonazePAM (KLONOPIN) 1 MG tablet TAKE 1/2 TO 1 TABLET DAILY AS NEEDED   . co-enzyme Q-10 30 MG capsule Take 30 mg by mouth 3 (three) times daily.   Marland Kitchen loratadine (CLARITIN) 10 MG tablet Take 10 mg by mouth daily.   . Nutritional Supplements (ADRENAL COMPLEX PO) Take by mouth.   . SUMAtriptan (IMITREX) 100 MG tablet Take 1 tablet by mouth as needed.   . Turmeric 450 MG CAPS Take 1 capsule by mouth daily. 08/27/2015: prn  . UNABLE TO FIND Place 1 application under the tongue at bedtime. Med Name: CBD oil with Frankincense    . vitamin B-12 (CYANOCOBALAMIN) 1000 MCG tablet Take 1,000 mcg by mouth daily.    No facility-administered encounter medications on file as of 08/05/2016.     Allergies  Allergen Reactions  . Duloxetine Nausea And Vomiting    CAN NOT TAKE WITH IMITREX   . Iodinated Diagnostic Agents Itching    Omnpaque i 300, 10 min delayed reaction of large hives, itching & urticaria// will require 13hr prep for future iv contrast,//a.calhoun  . Meperidine     Other  reaction(s): Respiratory Distress  . Dilaudid [Hydromorphone]     Hard times breathing    Review of Systems  Constitutional: Positive for malaise/fatigue.  Eyes: Negative.   Respiratory: Negative.   Cardiovascular: Positive for palpitations. Negative for chest pain and leg swelling.       Can see her heart beating  Gastrointestinal: Negative.   Musculoskeletal: Positive for joint pain and myalgias.  Skin: Negative.   Neurological: Positive for dizziness (at times) and headaches (migraines getting worse again.).  Endo/Heme/Allergies: Negative.        Feet and hands stay cold.  Psychiatric/Behavioral: The patient is nervous/anxious.     Objective:  BP 96/72   Pulse 90   Temp 98.3 F (36.8 C)   Resp  16   Wt 121 lb (54.9 kg)   SpO2 98%   BMI 20.14 kg/m   Physical Exam  Constitutional: She is oriented to person, place, and time and well-developed, well-nourished, and in no distress.  HENT:  Head: Normocephalic and atraumatic.  Right Ear: External ear normal.  Left Ear: External ear normal.  Nose: Nose normal.  Eyes: Conjunctivae are normal. No scleral icterus.  Neck: No thyromegaly present.  Cardiovascular: Normal rate, regular rhythm and normal heart sounds.   Pulmonary/Chest: Effort normal and breath sounds normal.  Abdominal: Soft.  Neurological: She is alert and oriented to person, place, and time. Gait normal. GCS score is 15.  Skin: Skin is warm and dry.  Psychiatric: Mood, memory, affect and judgment normal.    Assessment and Plan :  Chronic Anxiety R/o Pheochromocytoma,adrenal insufficiency,thyroid disease,anemia,organic causes. CBC/TSH/ESR/Cortisol/24hour urine. Migraine Headaches/aura Fatigue Palpitations Offerred Pamelor 25 mg daily. If labs are normal pt will consider trying this. Depression per EEG Chronic Anxiety  I have done the exam and reviewed the chart and it is accurate to the best of my knowledge. Development worker, community has been used and  any errors in dictation or transcription are unintentional. Miguel Aschoff M.D. [facility]

## 2016-08-05 NOTE — Telephone Encounter (Signed)
Rx canceled

## 2016-08-11 ENCOUNTER — Ambulatory Visit (INDEPENDENT_AMBULATORY_CARE_PROVIDER_SITE_OTHER): Payer: BLUE CROSS/BLUE SHIELD | Admitting: Physician Assistant

## 2016-08-11 ENCOUNTER — Encounter: Payer: Self-pay | Admitting: Physician Assistant

## 2016-08-11 VITALS — BP 112/80 | HR 74 | Temp 97.9°F | Resp 16 | Wt 122.0 lb

## 2016-08-11 DIAGNOSIS — N644 Mastodynia: Secondary | ICD-10-CM | POA: Diagnosis not present

## 2016-08-11 DIAGNOSIS — N63 Unspecified lump in unspecified breast: Secondary | ICD-10-CM

## 2016-08-11 NOTE — Progress Notes (Signed)
Patient: Krystal Marks Female    DOB: 13-Mar-1975   41 y.o.   MRN: [id number] Visit Date: 08/11/2016  Today's Provider: Margaretann Loveless, PA-C   Chief Complaint  Patient presents with  . Breast Problem    left breast and sore left underarm   Subjective:    HPI Pt is here today for a breast lump and lump in left underarm. She reports that she was not feeling and noticed a lump under her left arm and it is tender. She then did a self breast exam and noticed her had a lump in her left breast. She thought at first it was from her breast implants but the she did not feel it on the other side. She has no signs of infection. She is very fatigued. She reports that Dr. Sullivan Lone ordered blood test for her fatigue last week.     Allergies  Allergen Reactions  . Duloxetine Nausea And Vomiting    CAN NOT TAKE WITH IMITREX   . Iodinated Diagnostic Agents Itching    Omnpaque i 300, 10 min delayed reaction of large hives, itching & urticaria// will require 13hr prep for future iv contrast,//a.calhoun  . Meperidine     Other reaction(s): Respiratory Distress  . Dilaudid [Hydromorphone]     Hard times breathing     Current Outpatient Prescriptions:  .  acyclovir (ZOVIRAX) 400 MG tablet, Take 1 tablet by mouth as needed., Disp: , Rfl: 1 .  amphetamine-dextroamphetamine (ADDERALL) 20 MG tablet, Take 1 tablet (20 mg total) by mouth 2 (two) times daily., Disp: 60 tablet, Rfl: 0 .  APPLE CIDER VINEGAR PO, Take by mouth., Disp: , Rfl:  .  BLACK CURRANT SEED OIL PO, Take by mouth., Disp: , Rfl:  .  clonazePAM (KLONOPIN) 1 MG tablet, TAKE 1/2 TO 1 TABLET DAILY AS NEEDED, Disp: 30 tablet, Rfl: 5 .  co-enzyme Q-10 30 MG capsule, Take 30 mg by mouth 3 (three) times daily., Disp: , Rfl:  .  loratadine (CLARITIN) 10 MG tablet, Take 10 mg by mouth daily., Disp: , Rfl:  .  Nutritional Supplements (ADRENAL COMPLEX PO), Take by mouth., Disp: , Rfl:  .  SUMAtriptan (IMITREX) 100 MG tablet, Take 1  tablet by mouth as needed., Disp: , Rfl: 11 .  Turmeric 450 MG CAPS, Take 1 capsule by mouth daily., Disp: , Rfl:  .  UNABLE TO FIND, Place 1 application under the tongue at bedtime. Med Name: CBD oil with Frankincense , Disp: , Rfl:  .  vitamin B-12 (CYANOCOBALAMIN) 1000 MCG tablet, Take 1,000 mcg by mouth daily., Disp: , Rfl:   Review of Systems  Constitutional: Negative.   HENT: Negative.   Eyes: Negative.   Respiratory: Negative.   Cardiovascular: Negative.   Gastrointestinal: Negative.   Endocrine: Negative.   Genitourinary: Negative.   Musculoskeletal: Negative.   Skin: Negative.   Allergic/Immunologic: Negative.   Neurological: Negative.   Hematological: Negative.   Psychiatric/Behavioral: Negative.     Social History  Substance Use Topics  . Smoking status: Current Every Day Smoker    Packs/day: 0.50  . Smokeless tobacco: Never Used     Comment: 5-6 cigarettes daily  . Alcohol use 2.4 oz/week    4 Glasses of wine per week   Objective:   BP 112/80 (BP Location: Left Arm, Patient Position: Sitting, Cuff Size: Normal)   Pulse 74   Temp 97.9 F (36.6 C) (Oral)   Resp 16   Wt  122 lb (55.3 kg)   BMI 20.30 kg/m  Vitals:   08/11/16 1352  BP: 112/80  Pulse: 74  Resp: 16  Temp: 97.9 F (36.6 C)  TempSrc: Oral  Weight: 122 lb (55.3 kg)     Physical Exam  Constitutional: She appears well-developed and well-nourished. No distress.  Cardiovascular: Normal rate, regular rhythm, normal heart sounds and intact distal pulses.   No murmur heard. Pulmonary/Chest: Effort normal and breath sounds normal. Right breast exhibits no inverted nipple, no mass, no nipple discharge, no skin change and no tenderness. Left breast exhibits mass and tenderness. Left breast exhibits no inverted nipple, no nipple discharge and no skin change. Breasts are symmetrical.    Lymphadenopathy:    She has axillary adenopathy.       Left axillary: Pectoral adenopathy present. No lateral  adenopathy present. Vitals reviewed.      Assessment & Plan:     1. Breast pain, left New onset left axilla pain with noted lymphadenopathy. Then she noticed the lump in the lower left lateral breast. She had a screening mammogram at 35, but has not had any others since. She does have bilateral breast implants. Order placed for mammogram and US as below. I will f/u pending results.  - MM Digital Diagnostic Bilat; Future - US BREAST COMPLETE UNI LEFT INC AXILLA; Future  2. Breast mass in female See above medical treatment plan. - MM Digital Diagnostic Bilat; Future - US BREAST COMPLETE UNI LEFT INC AXILLA; Future       Margaretann LovelessJennifer M Cristofer Yaffe, PA-C  [facility]

## 2016-08-12 ENCOUNTER — Inpatient Hospital Stay
Admission: RE | Admit: 2016-08-12 | Discharge: 2016-08-12 | Disposition: A | Discharge status: Home / Self Care | Payer: Self-pay | Source: Ambulatory Visit | Attending: *Deleted | Admitting: *Deleted

## 2016-08-12 ENCOUNTER — Encounter (HOSPITAL_COMMUNITY): Payer: Self-pay | Admitting: *Deleted

## 2016-08-12 ENCOUNTER — Other Ambulatory Visit: Payer: Self-pay | Admitting: *Deleted

## 2016-08-12 ENCOUNTER — Ambulatory Visit (HOSPITAL_COMMUNITY)
Admission: EM | Admit: 2016-08-12 | Discharge: 2016-08-12 | Disposition: A | Discharge status: Home / Self Care | Payer: BLUE CROSS/BLUE SHIELD | Attending: Internal Medicine | Admitting: Internal Medicine

## 2016-08-12 DIAGNOSIS — K279 Peptic ulcer, site unspecified, unspecified as acute or chronic, without hemorrhage or perforation: Secondary | ICD-10-CM | POA: Diagnosis not present

## 2016-08-12 DIAGNOSIS — Z9289 Personal history of other medical treatment: Secondary | ICD-10-CM

## 2016-08-12 DIAGNOSIS — I2510 Atherosclerotic heart disease of native coronary artery without angina pectoris: Secondary | ICD-10-CM | POA: Diagnosis not present

## 2016-08-12 DIAGNOSIS — G43909 Migraine, unspecified, not intractable, without status migrainosus: Secondary | ICD-10-CM | POA: Diagnosis not present

## 2016-08-12 DIAGNOSIS — M797 Fibromyalgia: Secondary | ICD-10-CM | POA: Insufficient documentation

## 2016-08-12 DIAGNOSIS — F17210 Nicotine dependence, cigarettes, uncomplicated: Secondary | ICD-10-CM | POA: Insufficient documentation

## 2016-08-12 DIAGNOSIS — K219 Gastro-esophageal reflux disease without esophagitis: Secondary | ICD-10-CM | POA: Diagnosis not present

## 2016-08-12 DIAGNOSIS — Z885 Allergy status to narcotic agent status: Secondary | ICD-10-CM | POA: Insufficient documentation

## 2016-08-12 DIAGNOSIS — F411 Generalized anxiety disorder: Secondary | ICD-10-CM | POA: Insufficient documentation

## 2016-08-12 DIAGNOSIS — F321 Major depressive disorder, single episode, moderate: Secondary | ICD-10-CM | POA: Diagnosis not present

## 2016-08-12 DIAGNOSIS — N63 Unspecified lump in unspecified breast: Secondary | ICD-10-CM | POA: Diagnosis not present

## 2016-08-12 DIAGNOSIS — R5383 Other fatigue: Secondary | ICD-10-CM | POA: Insufficient documentation

## 2016-08-12 LAB — POCT URINALYSIS DIP (DEVICE)
Bilirubin Urine: NEGATIVE
Glucose, UA: NEGATIVE mg/dL
Ketones, ur: NEGATIVE mg/dL
LEUKOCYTES UA: NEGATIVE
NITRITE: NEGATIVE
PH: 7 (ref 5.0–8.0)
Protein, ur: NEGATIVE mg/dL
Specific Gravity, Urine: 1.01 (ref 1.005–1.030)
UROBILINOGEN UA: 0.2 mg/dL (ref 0.0–1.0)

## 2016-08-12 LAB — POCT I-STAT, CHEM 8
BUN: 6 mg/dL (ref 6–20)
CALCIUM ION: 1.18 mmol/L (ref 1.15–1.40)
CHLORIDE: 100 mmol/L — AB (ref 101–111)
Creatinine, Ser: 0.7 mg/dL (ref 0.44–1.00)
GLUCOSE: 91 mg/dL (ref 65–99)
HCT: 45 % (ref 36.0–46.0)
Hemoglobin: 15.3 g/dL — ABNORMAL HIGH (ref 12.0–15.0)
Potassium: 4.3 mmol/L (ref 3.5–5.1)
SODIUM: 139 mmol/L (ref 135–145)
TCO2: 28 mmol/L (ref 0–100)

## 2016-08-12 NOTE — ED Triage Notes (Signed)
Pt  Reports    Being  Fatigued  For  About  4  Days       She  Reports reently  Fund  Lump  On   Her  l     Breast     And  A  Knot  Under her  l    Armpit     She   States      She  Saw  A  pcp   Yesterday         And  She states      She has  A  mamogram  Scheduled  For  Next  Week     She  reports  Not  Sleeping     Well  recently

## 2016-08-12 NOTE — Discharge Instructions (Signed)
Your lab work/urine shows no infection, anemia, electrolyte imbalance. Your vitals showed some dehydration, keep hydrated, and food intake as tolerated. Follow up with your PCP for further evaluation and management.

## 2016-08-12 NOTE — ED Provider Notes (Signed)
[place]    CSN: [id number] Arrival date & time: 08/12/16  1339     History   Chief Complaint Chief Complaint  Patient presents with  . Fatigue    HPI Krystal Marks is a 41 y.o. female.   41 year old female with history of CAD, fibromyalgia, GERD, migraine, comes in for 1 week worsening fatigue. She was seen one week ago by her PCP, blood work was done,  CBC/TSH/ESR/Cortisol/24hour urine, results unknown. EKG was also done at that visit, and was told no cardiac causes for her fatigue. Patient states she found multiple hard masses on her breast, and was seen yesterday for breast mass and possible axillary lymphadenopathy. She has a mammogram and ultrasound scheduled for evaluation. Patient states her fatigue has been worse this week, where she feels weak and unable to keep her eyes open. She takes Adderall, which usually keeps her awake, but "it has not helped". She denies trouble falling asleep, trouble staying asleep, restless sleep. She does endorse some urinary frequency, but denies dysuria, hematuria. Denies vaginal discharge, itching/pain, spotting. Denies syncope. Denies fever, chills, night sweats.  Patient also states she recently ate undercooked salmon, that caused diarrhea for 2-3 days. As well as a recent migraine episode that was more severe than usual.      Past Medical History:  Diagnosis Date  . Anxiety   . Fibromyalgia   . GERD (gastroesophageal reflux disease)     Patient Active Problem List   Diagnosis Date Noted  . Fibromyalgia 01/12/2016  . ADD (attention deficit disorder) 08/16/2014  . Cigarette smoker 08/16/2014  . Depression, major, single episode, moderate ([place]) 08/16/2014  . Anxiety, generalized 08/16/2014  . Acid reflux 08/16/2014  . Irregular bleeding 08/16/2014  . Cannot sleep 08/16/2014  . Headache, migraine 08/16/2014  . Gastroduodenal ulcer 08/16/2014  . Anxiety 06/18/2014  . NONSPECIFIC ABNORM RESULTS OTH SPEC FUNCT STUDY  09/28/2007    Past Surgical History:  Procedure Laterality Date  . BREAST ENHANCEMENT SURGERY  2007  . SHOULDER ARTHROSCOPY Right     OB History    No data available       Home Medications    Prior to Admission medications   Medication Sig Start Date End Date Taking? Authorizing Provider  acyclovir (ZOVIRAX) 400 MG tablet Take 1 tablet by mouth as needed. 03/17/16   [provider]  amphetamine-dextroamphetamine (ADDERALL) 20 MG tablet Take 1 tablet (20 mg total) by mouth 2 (two) times daily. 08/05/16   Jerrol Banana., MD  APPLE CIDER VINEGAR PO Take by mouth.    [provider]  BLACK CURRANT SEED OIL PO Take by mouth.    [provider]  clonazePAM (KLONOPIN) 1 MG tablet TAKE 1/2 TO 1 TABLET DAILY AS NEEDED 03/01/16   Jerrol Banana., MD  co-enzyme Q-10 30 MG capsule Take 30 mg by mouth 3 (three) times daily.    [provider]  loratadine (CLARITIN) 10 MG tablet Take 10 mg by mouth daily.    [provider]  Nutritional Supplements (ADRENAL COMPLEX PO) Take by mouth.    [provider]  SUMAtriptan (IMITREX) 100 MG tablet Take 1 tablet by mouth as needed. 03/31/16   [provider]  Turmeric 450 MG CAPS Take 1 capsule by mouth daily.    [provider]  UNABLE TO FIND Place 1 application under the tongue at bedtime. Med Name: CBD oil with Frankincense     [provider]  vitamin  B-12 (CYANOCOBALAMIN) 1000 MCG tablet Take 1,000 mcg by mouth daily.    [provider]    Family History Family History  Problem Relation Age of Onset  . Osteoporosis Maternal Grandmother   . Esophageal cancer Maternal Grandfather     Social History Social History  Substance Use Topics  . Smoking status: Current Every Day Smoker    Packs/day: 0.50  . Smokeless tobacco: Never Used     Comment: 5-6 cigarettes daily  . Alcohol use 2.4 oz/week    4 Glasses of wine per week     Allergies     Duloxetine; Iodinated diagnostic agents; Meperidine; and Dilaudid [hydromorphone]   Review of Systems Review of Systems  Reason unable to perform ROS: As per HPI.     Physical Exam Triage Vital Signs ED Triage Vitals  Enc Vitals Group     BP 08/12/16 1416 (!) 111/57     Pulse Rate 08/12/16 1416 65     Resp 08/12/16 1416 14     Temp 08/12/16 1416 98.4 F (36.9 C)     Temp Source 08/12/16 1416 Oral     SpO2 08/12/16 1416 98 %     Weight 08/12/16 1417 122 lb (55.3 kg)     Height 08/12/16 1417 '5\' 5"'  (1.651 m)     Head Circumference --      Peak Flow --      Pain Score 08/12/16 1436 2     Pain Loc --      Pain Edu? --      Excl. in [place]? --    Orthostatic VS for the past 24 hrs:  BP- Lying Pulse- Lying BP- Sitting Pulse- Sitting BP- Standing at 0 minutes Pulse- Standing at 0 minutes  08/12/16 1620 112/64 53 107/68 58 (!) 109/34 92    Updated Vital Signs BP (!) 111/57 (BP Location: Left Arm)   Pulse 65   Temp 98.4 F (36.9 C) (Oral)   Resp 14   Ht '5\' 5"'  (1.651 m)   Wt 122 lb (55.3 kg)   LMP 08/01/2016   SpO2 98%   BMI 20.30 kg/m    Physical Exam  Constitutional: She is oriented to person, place, and time. She appears well-developed and well-nourished. No distress.  HENT:  Head: Normocephalic and atraumatic.  Mouth/Throat: Oropharynx is clear and moist.  Eyes: Pupils are equal, round, and reactive to light. Conjunctivae and EOM are normal.  Cardiovascular: Normal rate, regular rhythm and normal heart sounds.  Exam reveals no gallop and no friction rub.   No murmur heard. Pulmonary/Chest: Effort normal and breath sounds normal. She has no wheezes. She has no rales.  Neurological: She is alert and oriented to person, place, and time. She has normal strength. No cranial nerve deficit or sensory deficit. She displays a negative Romberg sign.     UC Treatments / Results  Labs (all labs ordered are listed, but only abnormal results are displayed) Labs Reviewed   POCT URINALYSIS DIP (DEVICE) - Abnormal; Notable for the following:       Result Value   Hgb urine dipstick TRACE (*)    All other components within normal limits  POCT I-STAT, CHEM 8 - Abnormal; Notable for the following:    Chloride 100 (*)    Hemoglobin 15.3 (*)    All other components within normal limits  URINE CULTURE    EKG  EKG Interpretation None       Radiology Mm Outside Films Mammo  Result Date: 08/12/2016 This examination belongs to an outside facility and is stored here for comparison purposes only.  Contact the originating outside institution for any associated report or interpretation.   Procedures Procedures (including critical care time)  Medications Ordered in UC Medications - No data to display   Initial Impression / Assessment and Plan / UC Course  I have reviewed the triage vital signs and the nursing notes.  Pertinent labs & imaging results that were available during my care of the patient were reviewed by me and considered in my medical decision making (see chart for details).  Clinical Course as of Aug 12 2304  Thu Aug 12, 2016  1557 Discussed urine results, with trace blood. Patient without gross blood in urine, stool. No vaginal bleeding/spotting. Discussed with patient given CBC drawn 1 week ago, can review results with PCP to check for anemia. Patient prefers istat here for results today.   [AY]    Clinical Course User Index [AY] Ok Edwards, PA-C    Discussed possible causes of fatigue including cardiac problems, infection, dehydration, anemia, endocrine problem. Discussed lab results with patient, trace blood in urine, but no signs of infection. Lab work showed no electrolyte imbalance, anemia. Given patient with recent diarrhea episodes, migraine with Imitrex use, discussed with patient could be reasons that exacerbated her fatigue. Orthostatic showed 40 bpm increase in heart rate, discussed with patient possible dehydration causing symptoms.  Patient to push fluids, eat as tolerated. Discussed with patient no alarming signs today, to follow-up with PCP for lab work result in further evaluation of fatigue. Patient expresses understanding and agrees to plan.  Final Clinical Impressions(s) / UC Diagnoses   Final diagnoses:  Fatigue, unspecified type    New Prescriptions Discharge Medication List as of 08/12/2016  4:32 PM        Ok Edwards, PA-C 08/12/16 2306

## 2016-08-15 LAB — URINE CULTURE

## 2016-08-16 ENCOUNTER — Encounter: Payer: Self-pay | Admitting: Family Medicine

## 2016-08-16 ENCOUNTER — Telehealth (HOSPITAL_COMMUNITY): Payer: Self-pay | Admitting: *Deleted

## 2016-08-16 ENCOUNTER — Telehealth: Payer: Self-pay

## 2016-08-16 MED ORDER — CEPHALEXIN 500 MG PO CAPS: 500.0000 mg | ORAL_CAPSULE | Freq: Two times a day (BID) | ORAL | 0 refills | Status: AC

## 2016-08-16 NOTE — Telephone Encounter (Signed)
Pt advised-aa 

## 2016-08-16 NOTE — Telephone Encounter (Signed)
Patient called and states that she was seen at ER on 08/12/16 for Fatigue. They did labs including urine culture-it was suggested for urine if patient was having symptoms to treat her with cephalexin 500 mg 1 BID for 5 days. But patient at that time was not having urinary issues just felt fatigue so she was not started on antibiotic. Patient states she is now having urinary frequency along with pain, bad odor and cloudy looking urine. She has been drinking plenty of fluids. She has had some diarrhea. Do you want her to be treated for UTI with above recommendation or something else? ER note and culture and labs in the computer-  Also she [facility] yet?-aa

## 2016-08-16 NOTE — Telephone Encounter (Signed)
Keflex 500mg  BID for 5 days.

## 2016-08-16 NOTE — Telephone Encounter (Signed)
Lab results discussed. Patient reports continued symptoms, will send in prescription to pharmacy of choice.

## 2016-08-17 LAB — HM MAMMOGRAPHY

## 2016-08-19 ENCOUNTER — Encounter: Payer: Self-pay | Admitting: Family Medicine

## 2016-08-20 ENCOUNTER — Encounter: Payer: Self-pay | Admitting: Family Medicine

## 2016-08-24 ENCOUNTER — Encounter: Payer: Self-pay | Admitting: Family Medicine

## 2016-09-07 ENCOUNTER — Encounter: Payer: Self-pay | Admitting: Family Medicine

## 2016-10-06 ENCOUNTER — Other Ambulatory Visit: Payer: Self-pay | Admitting: Family Medicine

## 2017-01-12 ENCOUNTER — Encounter: Payer: Self-pay | Admitting: Family Medicine

## 2017-01-12 ENCOUNTER — Ambulatory Visit: Payer: BLUE CROSS/BLUE SHIELD | Admitting: Family Medicine

## 2017-01-12 VITALS — BP 82/54 | HR 74 | Temp 98.4°F | Resp 16 | Wt 132.4 lb

## 2017-01-12 DIAGNOSIS — F908 Attention-deficit hyperactivity disorder, other type: Secondary | ICD-10-CM

## 2017-01-12 DIAGNOSIS — G43919 Migraine, unspecified, intractable, without status migrainosus: Secondary | ICD-10-CM

## 2017-01-12 DIAGNOSIS — K279 Peptic ulcer, site unspecified, unspecified as acute or chronic, without hemorrhage or perforation: Secondary | ICD-10-CM | POA: Diagnosis not present

## 2017-01-12 DIAGNOSIS — R42 Dizziness and giddiness: Secondary | ICD-10-CM | POA: Diagnosis not present

## 2017-01-12 DIAGNOSIS — R310 Gross hematuria: Secondary | ICD-10-CM

## 2017-01-12 DIAGNOSIS — R5383 Other fatigue: Secondary | ICD-10-CM

## 2017-01-12 DIAGNOSIS — N926 Irregular menstruation, unspecified: Secondary | ICD-10-CM

## 2017-01-12 LAB — POCT URINALYSIS DIPSTICK
Bilirubin, UA: NEGATIVE
GLUCOSE UA: NEGATIVE
Ketones, UA: NEGATIVE
LEUKOCYTES UA: NEGATIVE
Nitrite, UA: NEGATIVE
PH UA: 5 (ref 5.0–8.0)
Protein, UA: NEGATIVE
Spec Grav, UA: 1.015 (ref 1.010–1.025)
UROBILINOGEN UA: 0.2 U/dL

## 2017-01-12 LAB — POCT URINE PREGNANCY: Preg Test, Ur: NEGATIVE

## 2017-01-12 MED ORDER — AMPHETAMINE-DEXTROAMPHETAMINE 20 MG PO TABS: 20.0000 mg | ORAL_TABLET | Freq: Two times a day (BID) | ORAL | 0 refills | Status: DC

## 2017-01-12 NOTE — Progress Notes (Signed)
Krystal Marks  MRN: [id number] DOB: 1975/08/16  Subjective:  HPI  Patient is here for medication follow up and refill. Last routine office visit was on 08/05/16.  Patient also wants to be checked for pregnancy. Patient states she usually has a regular menstrual cycle and does not skip usually. She is sexually active and does not use protection. Last normal menstrual cycle was  On 12/06/2016. On 12/28/2016 she developed dizziness, headache, nausea, decreased appetite, on 12/29/2016 had severe body aches -feeling like she was getting sick but no fever. She took a pregnancy test and it had a faint positive line on it. On 12/31/16 she started spotting with brownish color kind of heavy.  On 01/02/17 spotting stopped. Then on 01/03/17 had light cramp, Started to get heartburn and feeling bloated and having a twinge of pain her abdomen. She took another test around the 1st and it was negative. She did consult with her NP at [facility] that told patient to repeat pregnancy test due to all of her symptom and told patient could have been a false positive test, or a miscarriage symptom or something else. On 01/04/17 patient had brown spotting, light again that stopped on 01/05/17. She is still not feeling well fatigue, dizziness, bloated, some soreness in her breasts, twinge sensation in her abdomen, headaches. Patient Active Problem List   Diagnosis Date Noted  . Fibromyalgia 01/12/2016  . ADD (attention deficit disorder) 08/16/2014  . Cigarette smoker 08/16/2014  . Depression, major, single episode, moderate ([facility]) 08/16/2014  . Anxiety, generalized 08/16/2014  . Acid reflux 08/16/2014  . Irregular bleeding 08/16/2014  . Cannot sleep 08/16/2014  . Headache, migraine 08/16/2014  . Gastroduodenal ulcer 08/16/2014  . Anxiety 06/18/2014  . NONSPECIFIC ABNORM RESULTS OTH SPEC FUNCT STUDY 09/28/2007    Past Medical History:  Diagnosis Date  . Anxiety   . Fibromyalgia   . GERD (gastroesophageal  reflux disease)     Social History   Socioeconomic History  . Marital status: Single    Spouse name: Not on file  . Number of children: Not on file  . Years of education: Not on file  . Highest education level: Not on file  Social Needs  . Financial resource strain: Not on file  . Food insecurity - worry: Not on file  . Food insecurity - inability: Not on file  . Transportation needs - medical: Not on file  . Transportation needs - non-medical: Not on file  Occupational History  . Not on file  Tobacco Use  . Smoking status: Current Every Day Smoker    Packs/day: 0.50  . Smokeless tobacco: Never Used  . Tobacco comment: 5-6 cigarettes daily  Substance and Sexual Activity  . Alcohol use: Yes    Alcohol/week: 2.4 oz    Types: 4 Glasses of wine per week  . Drug use: No  . Sexual activity: Not on file  Other Topics Concern  . Not on file  Social History Narrative  . Not on file    Outpatient Encounter Medications as of 01/12/2017  Medication Sig Note  . acyclovir (ZOVIRAX) 400 MG tablet Take 1 tablet by mouth as needed.   Marland Kitchen amphetamine-dextroamphetamine (ADDERALL) 20 MG tablet Take 1 tablet (20 mg total) by mouth 2 (two) times daily.   . APPLE CIDER VINEGAR PO Take by mouth.   Marland Kitchen BLACK CURRANT SEED OIL PO Take by mouth.   . clonazePAM (KLONOPIN) 1 MG tablet TAKE 1/2 TO 1 TABLET EVERY DAY AS  NEEDED   . co-enzyme Q-10 30 MG capsule Take 30 mg by mouth 3 (three) times daily.   Marland Kitchen. loratadine (CLARITIN) 10 MG tablet Take 10 mg by mouth daily.   . Nutritional Supplements (ADRENAL COMPLEX PO) Take by mouth.   . SUMAtriptan (IMITREX) 100 MG tablet Take 1 tablet by mouth as needed.   . Turmeric 450 MG CAPS Take 1 capsule by mouth daily. 08/27/2015: prn  . UNABLE TO FIND Place 1 application under the tongue at bedtime. Med Name: CBD oil with Frankincense    . vitamin B-12 (CYANOCOBALAMIN) 1000 MCG tablet Take 1,000 mcg by mouth daily.    No facility-administered encounter medications  on file as of 01/12/2017.     Allergies  Allergen Reactions  . Duloxetine Nausea And Vomiting    CAN NOT TAKE WITH IMITREX   . Iodinated Diagnostic Agents Itching    Omnpaque i 300, 10 min delayed reaction of large hives, itching & urticaria// will require 13hr prep for future iv contrast,//a.calhoun  . Meperidine     Other reaction(s): Respiratory Distress  . Dilaudid [Hydromorphone]     Hard times breathing    Review of Systems  Constitutional: Positive for malaise/fatigue. Negative for chills and fever.  Eyes: Negative.   Respiratory: Negative.   Cardiovascular: Negative.   Gastrointestinal: Positive for abdominal pain, heartburn and nausea.       Bloated  Musculoskeletal: Negative.   Skin: Negative.   Neurological: Positive for dizziness and headaches. Negative for weakness.  Endo/Heme/Allergies: Negative.   Psychiatric/Behavioral: The patient is nervous/anxious.     Objective:  BP (!) 82/54   Pulse 74   Temp 98.4 F (36.9 C)   Resp 16   Wt 132 lb 6.4 oz (60.1 kg)   LMP 01/04/2017 Comment: per patient spotted with brownish color for 1 day.  BMI 22.03 kg/m   Physical Exam  Constitutional: She is oriented to person, place, and time and well-developed, well-nourished, and in no distress.  HENT:  Head: Normocephalic and atraumatic.  Mouth/Throat: Oropharynx is clear and moist.  Eyes: Conjunctivae are normal. No scleral icterus.  Neck: No thyromegaly present.  Cardiovascular: Normal rate, regular rhythm and normal heart sounds.  Pulmonary/Chest: Effort normal and breath sounds normal.  Abdominal: Soft.  Neurological: She is alert and oriented to person, place, and time. Gait normal. GCS score is 15.  Skin: Skin is warm and dry.  Psychiatric: Mood, memory, affect and judgment normal.  Pregnancy test negative.  Assessment and Plan :  1. Attention deficit hyperactivity disorder (ADHD), other type Stable. Refills x 3 given. - amphetamine-dextroamphetamine (ADDERALL)  20 MG tablet; Take 1 tablet (20 mg total) by mouth 2 (two) times daily.  Dispense: 60 tablet; Refill: 0  2. Other fatigue Check lab work. Pregnancy test was negative today. UA in the office was slightly abnormal, check urine culture-pending results.  - POCT Urinalysis Dipstick - CBC with Differential/Platelet - Comprehensive metabolic panel - TSH  3. Intractable migraine without status migrainosus, unspecified migraine type - TSH  4. Abnormal menstrual cycle Advised patient to follow up with OBGYN about this and if further work up is needed. - POCT urine pregnancy - POCT Urinalysis Dipstick - CBC with Differential/Platelet  5. Dizziness  - CBC with Differential/Platelet - TSH  6. Gross hematuria Clinically not gross hematuria but will f/u on this. Send for culture - CBC with Differential/Platelet - Urine Culture  HPI, Exam and A&P transcribed by Domingo CockingAnastasiya Hopkins, RMA under direction and in the  presence of Julieanne Manson, MD. I have done the exam and reviewed the chart and it is accurate to the best of my knowledge. Dentist has been used and  any errors in dictation or transcription are unintentional. Julieanne Manson M.D. [facility]

## 2017-01-13 LAB — COMPREHENSIVE METABOLIC PANEL
ALBUMIN: 4.3 g/dL (ref 3.5–5.5)
ALT: 14 IU/L (ref 0–32)
AST: 15 IU/L (ref 0–40)
Albumin/Globulin Ratio: 1.6 (ref 1.2–2.2)
Alkaline Phosphatase: 51 IU/L (ref 39–117)
BUN/Creatinine Ratio: 13 (ref 9–23)
BUN: 12 mg/dL (ref 6–24)
Bilirubin Total: 1.1 mg/dL (ref 0.0–1.2)
CALCIUM: 9.4 mg/dL (ref 8.7–10.2)
CHLORIDE: 101 mmol/L (ref 96–106)
CO2: 26 mmol/L (ref 20–29)
CREATININE: 0.94 mg/dL (ref 0.57–1.00)
GFR, EST AFRICAN AMERICAN: 87 mL/min/{1.73_m2} (ref >59)
GFR, EST NON AFRICAN AMERICAN: 76 mL/min/{1.73_m2} (ref >59)
GLUCOSE: 76 mg/dL (ref 65–99)
Globulin, Total: 2.7 g/dL (ref 1.5–4.5)
Potassium: 4.3 mmol/L (ref 3.5–5.2)
Sodium: 140 mmol/L (ref 134–144)
Total Protein: 7 g/dL (ref 6.0–8.5)

## 2017-01-13 LAB — CBC WITH DIFFERENTIAL/PLATELET
BASOS ABS: 0.1 10*3/uL (ref 0.0–0.2)
Basos: 1 %
EOS (ABSOLUTE): 0.1 10*3/uL (ref 0.0–0.4)
Eos: 1 %
HEMOGLOBIN: 13.9 g/dL (ref 11.1–15.9)
Hematocrit: 40.5 % (ref 34.0–46.6)
IMMATURE GRANS (ABS): 0 10*3/uL (ref 0.0–0.1)
IMMATURE GRANULOCYTES: 0 %
LYMPHS: 30 %
Lymphocytes Absolute: 2.8 10*3/uL (ref 0.7–3.1)
MCH: 31.7 pg (ref 26.6–33.0)
MCHC: 34.3 g/dL (ref 31.5–35.7)
MCV: 92 fL (ref 79–97)
MONOCYTES: 7 %
Monocytes Absolute: 0.7 10*3/uL (ref 0.1–0.9)
NEUTROS PCT: 61 %
Neutrophils Absolute: 5.6 10*3/uL (ref 1.4–7.0)
PLATELETS: 315 10*3/uL (ref 150–379)
RBC: 4.39 x10E6/uL (ref 3.77–5.28)
RDW: 12.4 % (ref 12.3–15.4)
WBC: 9.3 10*3/uL (ref 3.4–10.8)

## 2017-01-13 LAB — TSH: TSH: 1.17 u[IU]/mL (ref 0.450–4.500)

## 2017-01-14 LAB — URINE CULTURE: ORGANISM ID, BACTERIA: NO GROWTH

## 2017-01-17 ENCOUNTER — Telehealth: Payer: Self-pay

## 2017-01-17 NOTE — Telephone Encounter (Signed)
-----   Message from Maple Hudsonichard L Gilbert Jr., MD sent at 01/17/2017 10:01 AM EST ----- Urine,labs all WNL.

## 2017-01-17 NOTE — Telephone Encounter (Signed)
Patient advised as directed below.  Thanks,  -Joseline 

## 2017-01-20 ENCOUNTER — Encounter: Payer: Self-pay | Admitting: Physician Assistant

## 2017-01-20 ENCOUNTER — Ambulatory Visit: Payer: BLUE CROSS/BLUE SHIELD | Admitting: Physician Assistant

## 2017-01-20 ENCOUNTER — Other Ambulatory Visit: Payer: Self-pay | Admitting: Physician Assistant

## 2017-01-20 VITALS — BP 96/70 | HR 88 | Temp 98.3°F | Resp 16 | Ht 66.0 in | Wt 130.0 lb

## 2017-01-20 DIAGNOSIS — F900 Attention-deficit hyperactivity disorder, predominantly inattentive type: Secondary | ICD-10-CM

## 2017-01-20 DIAGNOSIS — Z1239 Encounter for other screening for malignant neoplasm of breast: Secondary | ICD-10-CM

## 2017-01-20 DIAGNOSIS — Z1231 Encounter for screening mammogram for malignant neoplasm of breast: Secondary | ICD-10-CM | POA: Diagnosis not present

## 2017-01-20 DIAGNOSIS — Z113 Encounter for screening for infections with a predominantly sexual mode of transmission: Secondary | ICD-10-CM

## 2017-01-20 DIAGNOSIS — N926 Irregular menstruation, unspecified: Secondary | ICD-10-CM | POA: Diagnosis not present

## 2017-01-20 DIAGNOSIS — Z23 Encounter for immunization: Secondary | ICD-10-CM | POA: Diagnosis not present

## 2017-01-20 DIAGNOSIS — Z0000 Encounter for general adult medical examination without abnormal findings: Secondary | ICD-10-CM | POA: Diagnosis not present

## 2017-01-20 DIAGNOSIS — Z124 Encounter for screening for malignant neoplasm of cervix: Secondary | ICD-10-CM

## 2017-01-20 DIAGNOSIS — R5383 Other fatigue: Secondary | ICD-10-CM | POA: Insufficient documentation

## 2017-01-20 DIAGNOSIS — F5101 Primary insomnia: Secondary | ICD-10-CM | POA: Diagnosis not present

## 2017-01-20 HISTORY — DX: Attention-deficit hyperactivity disorder, predominantly inattentive type: F90.0

## 2017-01-20 HISTORY — DX: Other fatigue: R53.83

## 2017-01-20 LAB — POCT URINE PREGNANCY: PREG TEST UR: NEGATIVE

## 2017-01-20 NOTE — Patient Instructions (Signed)

## 2017-01-20 NOTE — Progress Notes (Signed)
Patient: Krystal Marks, Female    DOB: June 21, 1975, 41 y.o.   MRN: [id number] Visit Date: 01/20/2017  Today's Provider: Margaretann Loveless, PA-C   Chief Complaint  Patient presents with  . Annual Exam   Subjective:    Annual physical exam Krystal Marks is a 42 y.o. female who presents today for health maintenance and complete physical. She feels poorly. She reports exercising none. She reports she is sleeping poorly.  10/22/16 Mammogram-BI-RADS 3  11/15/14 Pap-neg ----------------------------------------------------------------- Patient reports she has trouble falling asleep. Patient reports she is taking Clonzepam 1 mg taking 1/2 to 1 tablet at bedtime, reports poor symptom control.   Patient C/O fatigue x's 3 weeks. Patient also C/O "heat" around neck, face on and off times 2 years.   Review of Systems  Constitutional: Positive for activity change, diaphoresis and fatigue.  HENT: Negative.   Eyes: Negative.   Respiratory: Negative.   Cardiovascular: Negative.   Gastrointestinal: Negative.   Endocrine: Negative.   Genitourinary: Negative.   Musculoskeletal: Positive for back pain.  Skin: Negative.   Allergic/Immunologic: Positive for environmental allergies.  Neurological: Positive for dizziness.  Hematological: Negative.   Psychiatric/Behavioral: Positive for agitation and sleep disturbance. The patient is nervous/anxious.     Social History      She  reports that she has been smoking.  She has been smoking about 0.50 packs per day. she has never used smokeless tobacco. She reports that she drinks about 2.4 oz of alcohol per week. She reports that she does not use drugs.       Social History   Socioeconomic History  . Marital status: Single    Spouse name: None  . Number of children: None  . Years of education: None  . Highest education level: None  Social Needs  . Financial resource strain: None  . Food insecurity - worry: None  . Food  insecurity - inability: None  . Transportation needs - medical: None  . Transportation needs - non-medical: None  Occupational History  . None  Tobacco Use  . Smoking status: Current Every Day Smoker    Packs/day: 0.50  . Smokeless tobacco: Never Used  . Tobacco comment: 5-6 cigarettes daily  Substance and Sexual Activity  . Alcohol use: Yes    Alcohol/week: 2.4 oz    Types: 4 Glasses of wine per week  . Drug use: No  . Sexual activity: None  Other Topics Concern  . None  Social History Narrative  . None    Past Medical History:  Diagnosis Date  . Anxiety   . Fibromyalgia   . GERD (gastroesophageal reflux disease)      Patient Active Problem List   Diagnosis Date Noted  . 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 Fatigue 01/20/2017  . Fibromyalgia 01/12/2016  . ADD (attention deficit disorder) 08/16/2014  . Cigarette smoker 08/16/2014  . Depression, major, single episode, moderate (HCC) 08/16/2014  . Anxiety, generalized 08/16/2014  . Acid reflux 08/16/2014  . Irregular bleeding 08/16/2014  . Cannot sleep 08/16/2014  . Migraine 08/16/2014  . Gastroduodenal ulcer 08/16/2014  . Anxiety 06/18/2014  . NONSPECIFIC ABNORM RESULTS OTH SPEC FUNCT STUDY 09/28/2007    Past Surgical History:  Procedure Laterality Date  . BREAST ENHANCEMENT SURGERY  2007  . SHOULDER ARTHROSCOPY Right     Family History        Family Status  Relation Name Status  . Mother  Alive  .  Father  Alive  . Brother  Alive  . MGM  Alive  . MGF  Deceased        Her family history includes Esophageal cancer in her maternal grandfather; Osteoporosis in her maternal grandmother.     Allergies  Allergen Reactions  . Duloxetine Nausea And Vomiting    CAN NOT TAKE WITH IMITREX   . Iodinated Diagnostic Agents Itching    Omnpaque i 300, 10 min delayed reaction of large hives, itching & urticaria// will require 13hr prep for future iv  contrast,//a.calhoun  . Meperidine     Other reaction(s): Respiratory Distress  . Dilaudid [Hydromorphone]     Hard times breathing     Current Outpatient Medications:  .  acyclovir (ZOVIRAX) 400 MG tablet, Take 1 tablet by mouth as needed., Disp: , Rfl: 1 .  amphetamine-dextroamphetamine (ADDERALL) 20 MG tablet, Take 1 tablet (20 mg total) by mouth 2 (two) times daily., Disp: 60 tablet, Rfl: 0 .  clonazePAM (KLONOPIN) 1 MG tablet, TAKE 1/2 TO 1 TABLET EVERY DAY AS NEEDED, Disp: 30 tablet, Rfl: 3 .  loratadine (CLARITIN) 10 MG tablet, Take 10 mg by mouth daily., Disp: , Rfl:  .  magnesium oxide (MAG-OX) 400 MG tablet, Take 400 mg by mouth daily., Disp: , Rfl:  .  SUMAtriptan (IMITREX) 100 MG tablet, Take 1 tablet by mouth as needed., Disp: , Rfl: 11 .  UNABLE TO FIND, Place 1 application under the tongue at bedtime. Med Name: CBD oil with Frankincense , Disp: , Rfl:  .  vitamin B-12 (CYANOCOBALAMIN) 1000 MCG tablet, Take 1,000 mcg by mouth daily., Disp: , Rfl:    Patient Care Team: Maple Hudson., MD as PCP - General (Family Medicine)      Objective:   Vitals: BP 96/70 (BP Location: Left Arm, Patient Position: Sitting, Cuff Size: Normal)   Pulse 88   Temp 98.3 F (36.8 C) (Oral)   Resp 16   Ht 5\' 6"  (1.676 m)   Wt 130 lb (59 kg)   LMP 01/04/2017 Comment: per patient spotted with brownish color for 1 day.  SpO2 99%   BMI 20.98 kg/m    Vitals:   01/20/17 1531  BP: 96/70  Pulse: 88  Resp: 16  Temp: 98.3 F (36.8 C)  TempSrc: Oral  SpO2: 99%  Weight: 130 lb (59 kg)  Height: 5\' 6"  (1.676 m)     Physical Exam  Constitutional: She is oriented to person, place, and time. She appears well-developed and well-nourished. No distress.  HENT:  Head: Normocephalic and atraumatic.  Right Ear: Hearing, tympanic membrane, external ear and ear canal normal.  Left Ear: Hearing, tympanic membrane, external ear and ear canal normal.  Nose: Nose normal.  Mouth/Throat: Uvula  is midline, oropharynx is clear and moist and mucous membranes are normal. No oropharyngeal exudate.  Eyes: Conjunctivae and EOM are normal. Pupils are equal, round, and reactive to light. Right eye exhibits no discharge. Left eye exhibits no discharge. No scleral icterus.  Neck: Normal range of motion. Neck supple. No JVD present. Carotid bruit is not present. No tracheal deviation present. No thyromegaly present.  Cardiovascular: Normal rate, regular rhythm, normal heart sounds and intact distal pulses. Exam reveals no gallop and no friction rub.  No murmur heard. Pulmonary/Chest: Effort normal and breath sounds normal. No respiratory distress. She has no wheezes. She has no rales. She exhibits no tenderness. Right breast exhibits no inverted nipple, no mass, no nipple discharge, no skin  change and no tenderness. Left breast exhibits no inverted nipple, no mass, no nipple discharge, no skin change and no tenderness. Breasts are symmetrical.  Breast augmentation  Abdominal: Soft. Bowel sounds are normal. She exhibits no distension and no mass. There is no tenderness. There is no rebound and no guarding. Hernia confirmed negative in the right inguinal area and confirmed negative in the left inguinal area.  Genitourinary: Rectum normal, vagina normal and uterus normal. No breast swelling, tenderness, discharge or bleeding. Pelvic exam was performed with patient supine. There is no rash, tenderness, lesion or injury on the right labia. There is no rash, tenderness, lesion or injury on the left labia. Cervix exhibits no motion tenderness, no discharge and no friability. Right adnexum displays no mass, no tenderness and no fullness. Left adnexum displays no mass, no tenderness and no fullness. No erythema, tenderness or bleeding in the vagina. No signs of injury around the vagina. No vaginal discharge found.  Musculoskeletal: Normal range of motion. She exhibits no edema or tenderness.  Lymphadenopathy:     She has no cervical adenopathy.       Right: No inguinal adenopathy present.       Left: No inguinal adenopathy present.  Neurological: She is alert and oriented to person, place, and time. She has normal reflexes. No cranial nerve deficit. Coordination normal.  Skin: Skin is warm and dry. No rash noted. She is not diaphoretic.  Psychiatric: She has a normal mood and affect. Her behavior is normal. Judgment and thought content normal.  Vitals reviewed.    Depression Screen PHQ 2/9 Scores 01/20/2017 01/12/2017 01/12/2016  PHQ - 2 Score 0 0 3  PHQ- 9 Score 5 2 12       Assessment & Plan:     Routine Health Maintenance and Physical Exam  Exercise Activities and Dietary recommendations Goals    None      Immunization History  Administered Date(s) Administered  . Hepatitis B 07/05/2014, 08/05/2014, 10/05/2014  . Hepatitis B, adult 08/21/2014, 10/24/2014  . Influenza,inj,Quad PF,6+ Mos 01/27/2016  . Tdap 07/16/2014    Health Maintenance  Topic Date Due  . HIV Screening  05/27/1990  . MAMMOGRAM  02/17/2017  . PAP SMEAR  11/14/2017  . TETANUS/TDAP  07/15/2024  . INFLUENZA VACCINE  Completed     Discussed health benefits of physical activity, and encouraged her to engage in regular exercise appropriate for her age and condition.    1. Annual physical exam Normal physical exam today. Will check labs as below and f/u pending lab results. If labs are stable and WNL she will not need to have these rechecked for one year at her next annual physical exam. She is to call the office in the meantime if she has any acute issue, questions or concerns. - Lipid Profile  2. Cervical cancer screening Pap collected today. Will send as below and f/u pending results. - Pap IG, CT/NG NAA, and HPV (high risk)  3. Breast cancer screening Has 6 month repeat mammogram with [facility] on 02/17/17.  4. Irregular menstrual cycle Urine pregnancy negative. Will check blood work as below to confirm  negative. Does have h/o ovarian cyst per patient, possibly uterine fibroid, she couldn't remember. Was previously followed by [facility]. - POCT urine pregnancy - Beta HCG, Quant  5. Screen for STD (sexually transmitted disease) Will check labs as below and f/u pending results. - HIV antibody (with reflex) - RPR  6. Primary insomnia Worsening. Clonazepam not working well. Discussed  sleep meditation techniques. She is willing to try. She is to discuss this with Dr. Sullivan LoneGilbert at her next f/u for medication management if no improvements.   7. Need for influenza vaccination Flu vaccine given today without complication. Patient sat upright for 15 minutes to check for adverse reaction before being released. - Flu Vaccine QUAD 36+ mos IM  --------------------------------------------------------------------    Margaretann LovelessJennifer M Shaquira Moroz, PA-C  [facility]

## 2017-01-21 ENCOUNTER — Telehealth: Payer: Self-pay

## 2017-01-21 LAB — LIPID PANEL
CHOL/HDL RATIO: 2.1 ratio (ref 0.0–4.4)
Cholesterol, Total: 141 mg/dL (ref 100–199)
HDL: 66 mg/dL (ref >39)
LDL Calculated: 67 mg/dL (ref 0–99)
TRIGLYCERIDES: 42 mg/dL (ref 0–149)
VLDL CHOLESTEROL CAL: 8 mg/dL (ref 5–40)

## 2017-01-21 LAB — RPR: RPR Ser Ql: NONREACTIVE

## 2017-01-21 LAB — BETA HCG QUANT (REF LAB): hCG Quant: <1 m[IU]/mL

## 2017-01-21 LAB — HIV ANTIBODY (ROUTINE TESTING W REFLEX): HIV Screen 4th Generation wRfx: NONREACTIVE

## 2017-01-21 NOTE — Telephone Encounter (Signed)
-----   Message from Margaretann LovelessJennifer M Burnette, PA-C sent at 01/21/2017  8:38 AM EST ----- All labs are normal. HIV negative, RPR negative, HCG negative. Cholesterol normal.

## 2017-01-21 NOTE — Telephone Encounter (Signed)
lmtcb

## 2017-01-24 ENCOUNTER — Telehealth: Payer: Self-pay | Admitting: Physician Assistant

## 2017-01-24 DIAGNOSIS — R87810 Cervical high risk human papillomavirus (HPV) DNA test positive: Secondary | ICD-10-CM

## 2017-01-24 DIAGNOSIS — N926 Irregular menstruation, unspecified: Secondary | ICD-10-CM

## 2017-01-24 NOTE — Telephone Encounter (Signed)
Patient was seen last week for a pap.   She started bleeding this morning and occasional sharpe shooting pain on right side and is not due to start until next Monday.   Wanted to discuss this w/ Antony ContrasJenni

## 2017-01-24 NOTE — Telephone Encounter (Signed)
Per Maurine Ministerennis with [facility] specimen still pending.  Thanks,  -Joseline

## 2017-01-24 NOTE — Telephone Encounter (Signed)
Please review. Thanks!  

## 2017-01-24 NOTE — Telephone Encounter (Signed)
Active in mychart.see other note regarding pap.  Thanks,  -Jaquon Gingerich

## 2017-01-24 NOTE — Telephone Encounter (Signed)
So patient was calling for pap results. It was collected on 01/20/17 and I see order, but there is no result. Can we check with [facility] to make sure specimen has been received. Thanks.

## 2017-01-25 LAB — PAP IG, CT-NG NAA, HPV HIGH-RISK
Chlamydia, Nuc. Acid Amp: NEGATIVE
GONOCOCCUS BY NUCLEIC ACID AMP: NEGATIVE
HPV, high-risk: POSITIVE — AB
PAP Smear Comment: 0

## 2017-01-26 NOTE — Telephone Encounter (Signed)
Called and discussed pap results with patient. [facility] She is also having irregular menstrual cycles.

## 2017-01-26 NOTE — Telephone Encounter (Signed)
Antony ContrasJenni, It looks like the labs and Pap are back but I can't tell that they have been signed off. Could you check this? Thanks Northwest AirlinesElena

## 2017-02-17 ENCOUNTER — Other Ambulatory Visit: Payer: Self-pay | Admitting: Family Medicine

## 2017-02-19 ENCOUNTER — Other Ambulatory Visit: Payer: Self-pay | Admitting: Family Medicine

## 2017-03-29 ENCOUNTER — Telehealth: Payer: Self-pay

## 2017-03-29 NOTE — Telephone Encounter (Signed)
Patient called wanting to know if something could be called in for BV. She has used apple cider vinegar, probiotics, and tea tree oil and nothing has helped. She would like flagyl sent into [facility] [place]. Thanks!

## 2017-03-30 ENCOUNTER — Ambulatory Visit (INDEPENDENT_AMBULATORY_CARE_PROVIDER_SITE_OTHER): Payer: BLUE CROSS/BLUE SHIELD | Admitting: Physician Assistant

## 2017-03-30 ENCOUNTER — Encounter: Payer: Self-pay | Admitting: Physician Assistant

## 2017-03-30 VITALS — BP 110/70 | HR 89 | Temp 98.3°F | Resp 16 | Wt 124.0 lb

## 2017-03-30 DIAGNOSIS — N898 Other specified noninflammatory disorders of vagina: Secondary | ICD-10-CM | POA: Diagnosis not present

## 2017-03-30 DIAGNOSIS — B9689 Other specified bacterial agents as the cause of diseases classified elsewhere: Secondary | ICD-10-CM | POA: Diagnosis not present

## 2017-03-30 DIAGNOSIS — T3695XA Adverse effect of unspecified systemic antibiotic, initial encounter: Secondary | ICD-10-CM | POA: Diagnosis not present

## 2017-03-30 DIAGNOSIS — J0100 Acute maxillary sinusitis, unspecified: Secondary | ICD-10-CM | POA: Diagnosis not present

## 2017-03-30 DIAGNOSIS — N760 Acute vaginitis: Secondary | ICD-10-CM | POA: Diagnosis not present

## 2017-03-30 DIAGNOSIS — B379 Candidiasis, unspecified: Secondary | ICD-10-CM | POA: Diagnosis not present

## 2017-03-30 MED ORDER — AMOXICILLIN-POT CLAVULANATE 875-125 MG PO TABS: 1.0000 | ORAL_TABLET | Freq: Two times a day (BID) | ORAL | 0 refills | Status: DC

## 2017-03-30 MED ORDER — FLUCONAZOLE 150 MG PO TABS: 150.0000 mg | ORAL_TABLET | Freq: Once | ORAL | 0 refills | Status: DC

## 2017-03-30 MED ORDER — METRONIDAZOLE 0.75 % VA GEL: 1.0000 | Freq: Two times a day (BID) | VAGINAL | 0 refills | Status: DC

## 2017-03-30 NOTE — Patient Instructions (Signed)
Bacterial Vaginosis Bacterial vaginosis is a vaginal infection that occurs when the normal balance of bacteria in the vagina is disrupted. It results from an overgrowth of certain bacteria. This is the most common vaginal infection among women ages 15-44. Because bacterial vaginosis increases your risk for STIs (sexually transmitted infections), getting treated can help reduce your risk for chlamydia, gonorrhea, herpes, and HIV (human immunodeficiency virus). Treatment is also important for preventing complications in pregnant women, because this condition can cause an early (premature) delivery. What are the causes? This condition is caused by an increase in harmful bacteria that are normally present in small amounts in the vagina. However, the reason that the condition develops is not fully understood. What increases the risk? The following factors may make you more likely to develop this condition:  Having a new sexual partner or multiple sexual partners.  Having unprotected sex.  Douching.  Having an intrauterine device (IUD).  Smoking.  Drug and alcohol abuse.  Taking certain antibiotic medicines.  Being pregnant.  You cannot get bacterial vaginosis from toilet seats, bedding, swimming pools, or contact with objects around you. What are the signs or symptoms? Symptoms of this condition include:  Grey or white vaginal discharge. The discharge can also be watery or foamy.  A fish-like odor with discharge, especially after sexual intercourse or during menstruation.  Itching in and around the vagina.  Burning or pain with urination.  Some women with bacterial vaginosis have no signs or symptoms. How is this diagnosed? This condition is diagnosed based on:  Your medical history.  A physical exam of the vagina.  Testing a sample of vaginal fluid under a microscope to look for a large amount of bad bacteria or abnormal cells. Your health care provider may use a cotton swab  or a small wooden spatula to collect the sample.  How is this treated? This condition is treated with antibiotics. These may be given as a pill, a vaginal cream, or a medicine that is put into the vagina (suppository). If the condition comes back after treatment, a second round of antibiotics may be needed. Follow these instructions at home: Medicines  Take over-the-counter and prescription medicines only as told by your health care provider.  Take or use your antibiotic as told by your health care provider. Do not stop taking or using the antibiotic even if you start to feel better. General instructions  If you have a female sexual partner, tell her that you have a vaginal infection. She should see her health care provider and be treated if she has symptoms. If you have a female sexual partner, he does not need treatment.  During treatment: ? Avoid sexual activity until you finish treatment. ? Do not douche. ? Avoid alcohol as directed by your health care provider. ? Avoid breastfeeding as directed by your health care provider.  Drink enough water and fluids to keep your urine clear or pale yellow.  Keep the area around your vagina and rectum clean. ? Wash the area daily with warm water. ? Wipe yourself from front to back after using the toilet.  Keep all follow-up visits as told by your health care provider. This is important. How is this prevented?  Do not douche.  Wash the outside of your vagina with warm water only.  Use protection when having sex. This includes latex condoms and dental dams.  Limit how many sexual partners you have. To help prevent bacterial vaginosis, it is best to have sex with just   one partner (monogamous).  Make sure you and your sexual partner are tested for STIs.  Wear cotton or cotton-lined underwear.  Avoid wearing tight pants and pantyhose, especially during summer.  Limit the amount of alcohol that you drink.  Do not use any products that  contain nicotine or tobacco, such as cigarettes and e-cigarettes. If you need help quitting, ask your health care provider.  Do not use illegal drugs. Where to find more information:  Centers for Disease Control and Prevention: www.cdc.gov/std  American Sexual Health Association (ASHA): www.ashastd.org  U.S. Department of Health and Human Services, Office on Women's Health: www.womenshealth.gov/ or https://www.womenshealth.gov/a-z-topics/bacterial-vaginosis Contact a health care provider if:  Your symptoms do not improve, even after treatment.  You have more discharge or pain when urinating.  You have a fever.  You have pain in your abdomen.  You have pain during sex.  You have vaginal bleeding between periods. Summary  Bacterial vaginosis is a vaginal infection that occurs when the normal balance of bacteria in the vagina is disrupted.  Because bacterial vaginosis increases your risk for STIs (sexually transmitted infections), getting treated can help reduce your risk for chlamydia, gonorrhea, herpes, and HIV (human immunodeficiency virus). Treatment is also important for preventing complications in pregnant women, because the condition can cause an early (premature) delivery.  This condition is treated with antibiotic medicines. These may be given as a pill, a vaginal cream, or a medicine that is put into the vagina (suppository). This information is not intended to replace advice given to you by your health care provider. Make sure you discuss any questions you have with your health care provider. Document Released: 12/21/2004 Document Revised: 04/26/2016 Document Reviewed: 09/06/2015 Elsevier Interactive Patient Education  2018 [employer]  

## 2017-03-30 NOTE — Progress Notes (Signed)
Patient: Krystal Marks Female    DOB: 03/22/1975   42 y.o.   MRN: [id number] Visit Date: 03/30/2017  Today's Provider: Margaretann LovelessJennifer M Thania Woodlief, PA-C   Chief Complaint  Patient presents with  . Vaginal Discharge  . URI   Subjective:    Vaginal Discharge  The patient's primary symptoms include vaginal discharge. This is a new problem. The current episode started 1 to 4 weeks ago (on and off for 2 weeks). The problem has been unchanged. The patient is experiencing no pain. She is not pregnant. Associated symptoms include a sore throat. Pertinent negatives include no abdominal pain, back pain, diarrhea, dysuria, fever, headaches, nausea, painful intercourse, rash or vomiting. Associated symptoms comments: "bad odor'. The vaginal discharge was white and thin. There has been no bleeding. She has not been passing clots. She has not been passing tissue. Nothing aggravates the symptoms. She has tried douching (apple cider vinager douche, probiotic.Marland Kitchen.tea tree oil and coconut oil) for the symptoms. The treatment provided mild relief. She is sexually active. It is unknown whether or not her partner has an STD. She uses condoms (Reports that her boyfriend condoms ripped) for contraception. Her menstrual history has been regular. Her past medical history is significant for herpes simplex and vaginosis. (History for HPV-Pos)  URI   This is a new problem. The current episode started 1 to 4 weeks ago. The problem has been gradually worsening. There has been no fever. Associated symptoms include congestion, coughing (is productive), sinus pain and a sore throat. Pertinent negatives include no abdominal pain, diarrhea, dysuria, ear pain, headaches, nausea, plugged ear sensation, rash, rhinorrhea, vomiting or wheezing. She has tried increased fluids and decongestant (cherry cough drop at night and Vitam C candy,theraFlu) for the symptoms. The treatment provided no relief.   LMP:03/12-03/17    Allergies    Allergen Reactions  . Duloxetine Nausea And Vomiting    CAN NOT TAKE WITH IMITREX   . Iodinated Diagnostic Agents Itching    Omnpaque i 300, 10 min delayed reaction of large hives, itching & urticaria// will require 13hr prep for future iv contrast,//a.calhoun  . Meperidine     Other reaction(s): Respiratory Distress  . Dilaudid [Hydromorphone]     Hard times breathing     Current Outpatient Medications:  .  amphetamine-dextroamphetamine (ADDERALL) 20 MG tablet, Take 1 tablet (20 mg total) by mouth 2 (two) times daily., Disp: 60 tablet, Rfl: 0 .  clonazePAM (KLONOPIN) 1 MG tablet, TAKE 1/2 TO 1 TABLET BY MOUTH DAILY AS NEEDED, Disp: 30 tablet, Rfl: 1 .  loratadine (CLARITIN) 10 MG tablet, Take 10 mg by mouth daily., Disp: , Rfl:  .  SUMAtriptan (IMITREX) 100 MG tablet, Take 1 tablet by mouth as needed., Disp: , Rfl: 11 .  UNABLE TO FIND, Place 1 application under the tongue at bedtime. Med Name: CBD oil with Frankincense , Disp: , Rfl:  .  acyclovir (ZOVIRAX) 400 MG tablet, Take 1 tablet by mouth as needed., Disp: , Rfl: 1  Review of Systems  Constitutional: Positive for fatigue. Negative for fever.  HENT: Positive for congestion, sinus pressure, sinus pain, sore throat and voice change. Negative for ear discharge, ear pain, postnasal drip and rhinorrhea.   Respiratory: Positive for cough (is productive). Negative for chest tightness, shortness of breath and wheezing.   Gastrointestinal: Negative for abdominal pain, diarrhea, nausea and vomiting.  Genitourinary: Positive for vaginal discharge. Negative for dysuria.  Musculoskeletal: Negative for back pain.  Skin:  Negative for rash.  Neurological: Negative for dizziness, light-headedness and headaches.    Social History   Tobacco Use  . Smoking status: Current Every Day Smoker    Packs/day: 0.50  . Smokeless tobacco: Never Used  . Tobacco comment: 5-6 cigarettes daily  Substance Use Topics  . Alcohol use: Yes    Alcohol/week:  2.4 oz    Types: 4 Glasses of wine per week   Objective:   BP 110/70 (BP Location: Left Arm, Patient Position: Sitting, Cuff Size: Normal)   Pulse 89   Temp 98.3 F (36.8 C) (Oral)   Resp 16   Wt 124 lb (56.2 kg)   LMP 03/15/2017   BMI 20.01 kg/m  Vitals:   03/30/17 1348  BP: 110/70  Pulse: 89  Resp: 16  Temp: 98.3 F (36.8 C)  TempSrc: Oral  Weight: 124 lb (56.2 kg)     Physical Exam  Constitutional: She appears well-developed and well-nourished. No distress.  HENT:  Head: Normocephalic and atraumatic.  Right Ear: Hearing, tympanic membrane, external ear and ear canal normal.  Left Ear: Hearing, tympanic membrane, external ear and ear canal normal.  Nose: Right sinus exhibits maxillary sinus tenderness. Right sinus exhibits no frontal sinus tenderness. Left sinus exhibits maxillary sinus tenderness. Left sinus exhibits no frontal sinus tenderness.  Mouth/Throat: Uvula is midline, oropharynx is clear and moist and mucous membranes are normal. No oropharyngeal exudate.  Neck: Normal range of motion. Neck supple. No tracheal deviation present. No thyromegaly present.  Cardiovascular: Normal rate, regular rhythm and normal heart sounds. Exam reveals no gallop and no friction rub.  No murmur heard. Pulmonary/Chest: Effort normal and breath sounds normal. No stridor. No respiratory distress. She has no wheezes. She has no rales.  Genitourinary: No labial fusion. There is no rash, tenderness, lesion or injury on the right labia. There is no rash, tenderness, lesion or injury on the left labia. Vaginal discharge (copious white, thin discharge) found.  Lymphadenopathy:    She has no cervical adenopathy.  Skin: She is not diaphoretic.  Vitals reviewed.      Assessment & Plan:     1. Vaginal discharge Will check as below. Follow up pending results. Suspect BV.  - NuSwab Vaginitis Plus (VG+)  2. Bacterial vaginosis Will start treatment while awaiting results from Nuswab. Will  adjust treatment as needed. - metroNIDAZOLE (METROGEL) 0.75 % vaginal gel; Place 1 Applicatorful vaginally 2 (two) times daily.  Dispense: 70 g; Refill: 0  3. Acute maxillary sinusitis, recurrence not specified Worsening symptoms that have not responded to OTC medications. Will give augmentin as below. Continue allergy medications. Stay well hydrated and get plenty of rest. Call if no symptom improvement or if symptoms worsen. - amoxicillin-clavulanate (AUGMENTIN) 875-125 MG tablet; Take 1 tablet by mouth 2 (two) times daily.  Dispense: 20 tablet; Refill: 0  4. Antibiotic-induced yeast infection  - fluconazole (DIFLUCAN) 150 MG tablet; Take 1 tablet (150 mg total) by mouth once for 1 dose. May repeat in 48-72 hrs if needed  Dispense: 2 tablet; Refill: 0       Margaretann Loveless, PA-C  [facility]

## 2017-03-30 NOTE — Telephone Encounter (Signed)
Would need to be seen for Dx--maybe Dr B or Karna ChristmasAdrianna or MahinahinaJenni

## 2017-03-30 NOTE — Telephone Encounter (Signed)
App't made with Antony ContrasJenni for today.

## 2017-04-01 LAB — NUSWAB VAGINITIS PLUS (VG+)
ATOPOBIUM VAGINAE: HIGH {score} — AB
CANDIDA GLABRATA, NAA: NEGATIVE
Candida albicans, NAA: NEGATIVE
Chlamydia trachomatis, NAA: NEGATIVE
Neisseria gonorrhoeae, NAA: NEGATIVE
Trich vag by NAA: NEGATIVE

## 2017-04-01 NOTE — Telephone Encounter (Signed)
-----   Message from Margaretann LovelessJennifer M Burnette, [place] JerseyPA-C sent at 04/01/2017  1:51 PM EDT ----- NuSwab only positive for BV as suspected. Continue metronidazole topically as prescribed.

## 2017-04-01 NOTE — Telephone Encounter (Signed)
Patient advised as below. Patient wants to know how many days does she need to use the metronidazole? Patient also reports she is still cough and phlegm is still yellow and wants to know when her symptoms will improve on the antibiotic. Please advise. sd

## 2017-04-01 NOTE — Telephone Encounter (Signed)
Patient advised as directed below.  Thanks,  -Joseline 

## 2017-04-01 NOTE — Telephone Encounter (Signed)
Antibiotic can take 3-4 days if not longer. Metronidazole gel is twice daily x 7 days or until symptoms clear.

## 2017-04-08 ENCOUNTER — Telehealth: Payer: Self-pay | Admitting: Emergency Medicine

## 2017-04-08 NOTE — Telephone Encounter (Signed)
Please review. Thanks!  

## 2017-04-08 NOTE — Telephone Encounter (Signed)
Advised  ED 

## 2017-04-08 NOTE — Telephone Encounter (Signed)
Yes continue allergy med and sudafed. Can add Mucinex. Can add a nasal spray such as flonase.

## 2017-04-08 NOTE — Telephone Encounter (Signed)
Pt reports that she is on her last day of antibiotic for her she is coughing up yellow mucus and her nose is running. She is wondering if this is viral instead of bacterial and wants to know what she can do for this. She has been taking sudafed and an allergy pill daily. She reports that she is feeling better. Please advise.     [facility]

## 2017-04-11 ENCOUNTER — Other Ambulatory Visit: Payer: Self-pay

## 2017-04-11 ENCOUNTER — Ambulatory Visit: Payer: BLUE CROSS/BLUE SHIELD | Admitting: Family Medicine

## 2017-04-11 VITALS — BP 100/60 | HR 88 | Temp 98.7°F | Resp 16 | Wt 123.0 lb

## 2017-04-11 DIAGNOSIS — F411 Generalized anxiety disorder: Secondary | ICD-10-CM | POA: Diagnosis not present

## 2017-04-11 DIAGNOSIS — F908 Attention-deficit hyperactivity disorder, other type: Secondary | ICD-10-CM | POA: Diagnosis not present

## 2017-04-11 DIAGNOSIS — K219 Gastro-esophageal reflux disease without esophagitis: Secondary | ICD-10-CM

## 2017-04-11 DIAGNOSIS — M797 Fibromyalgia: Secondary | ICD-10-CM | POA: Diagnosis not present

## 2017-04-11 DIAGNOSIS — F902 Attention-deficit hyperactivity disorder, combined type: Secondary | ICD-10-CM | POA: Diagnosis not present

## 2017-04-11 MED ORDER — AMPHETAMINE-DEXTROAMPHETAMINE 20 MG PO TABS: 20.0000 mg | ORAL_TABLET | Freq: Two times a day (BID) | ORAL | 0 refills | Status: DC

## 2017-04-11 MED ORDER — CLONAZEPAM 0.5 MG PO TABS: 0.5000 mg | ORAL_TABLET | Freq: Two times a day (BID) | ORAL | 2 refills | Status: DC | PRN

## 2017-04-11 MED ORDER — AMPHETAMINE-DEXTROAMPHETAMINE 20 MG PO TABS
20.0000 mg | ORAL_TABLET | Freq: Two times a day (BID) | ORAL | 0 refills | Status: DC
Start: 2017-05-11 — End: 2017-04-11

## 2017-04-11 NOTE — Progress Notes (Signed)
Krystal Marks  MRN: [id number] DOB: 03/20/1975  Subjective:  HPI   The patient is a 42 year old female who presents for follow up of her ADHD and get refill on her Adderall.  She has also been using Clonazepam but states that she is decreasing the use of it and using CBD oil instead.  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  She states she is happy and doing very well.  Patient Active Problem List   Diagnosis Date Noted  . 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 Fatigue 01/20/2017  . Fibromyalgia 01/12/2016  . ADD (attention deficit disorder) 08/16/2014  . Cigarette smoker 08/16/2014  . Depression, major, single episode, moderate (HCC) 08/16/2014  . Anxiety, generalized 08/16/2014  . Acid reflux 08/16/2014  . Irregular bleeding 08/16/2014  . Cannot sleep 08/16/2014  . Migraine 08/16/2014  . Gastroduodenal ulcer 08/16/2014  . Anxiety 06/18/2014  . NONSPECIFIC ABNORM RESULTS OTH SPEC FUNCT STUDY 09/28/2007    Past Medical History:  Diagnosis Date  . Anxiety   . Fibromyalgia   . GERD (gastroesophageal reflux disease)     Social History   Socioeconomic History  . Marital status: Single    Spouse name: Not on file  . Number of children: Not on file  . Years of education: Not on file  . Highest education level: Not on file  Occupational History  . Not on file  Social Needs  . Financial resource strain: Not on file  . Food insecurity:    Worry: Not on file    Inability: Not on file  . Transportation needs:    Medical: Not on file    Non-medical: Not on file  Tobacco Use  . Smoking status: Current Every Day Smoker    Packs/day: 0.50  . Smokeless tobacco: Never Used  . Tobacco comment: 5-6 cigarettes daily  Substance and Sexual Activity  . Alcohol use: Yes    Alcohol/week: 2.4 oz    Types: 4 Glasses of wine per week  . Drug use: No  . Sexual activity: Not on file  Lifestyle  . Physical activity:   Days per week: Not on file    Minutes per session: Not on file  . Stress: Not on file  Relationships  . Social connections:    Talks on phone: Not on file    Gets together: Not on file    Attends religious service: Not on file    Active member of club or organization: Not on file    Attends meetings of clubs or organizations: Not on file    Relationship status: Not on file  . Intimate partner violence:    Fear of current or ex partner: Not on file    Emotionally abused: Not on file    Physically abused: Not on file    Forced sexual activity: Not on file  Other Topics Concern  . Not on file  Social History Narrative  . Not on file    Outpatient Encounter Medications as of 04/11/2017  Medication Sig  . acyclovir (ZOVIRAX) 400 MG tablet Take 1 tablet by mouth as needed.  Marland Kitchen. amoxicillin-clavulanate (AUGMENTIN) 875-125 MG tablet Take 1 tablet by mouth 2 (two) times daily.  Marland Kitchen. amphetamine-dextroamphetamine (ADDERALL) 20 MG tablet Take 1 tablet (20 mg total) by mouth 2 (two) times daily.  . clonazePAM (KLONOPIN) 1 MG tablet TAKE 1/2 TO 1 TABLET BY MOUTH DAILY AS NEEDED  . loratadine (CLARITIN) 10 MG tablet  Take 10 mg by mouth daily.  . metroNIDAZOLE (METROGEL) 0.75 % vaginal gel Place 1 Applicatorful vaginally 2 (two) times daily.  . SUMAtriptan (IMITREX) 100 MG tablet Take 1 tablet by mouth as needed.  Marland Kitchen UNABLE TO FIND Place 1 application under the tongue at bedtime. Med Name: CBD oil with Frankincense    No facility-administered encounter medications on file as of 04/11/2017.     Allergies  Allergen Reactions  . Duloxetine Nausea And Vomiting    CAN NOT TAKE WITH IMITREX   . Iodinated Diagnostic Agents Itching    Omnpaque i 300, 10 min delayed reaction of large hives, itching & urticaria// will require 13hr prep for future iv contrast,//a.calhoun  . Meperidine     Other reaction(s): Respiratory Distress  . Dilaudid [Hydromorphone]     Hard times breathing    Review of Systems    Constitutional: Negative for fever and malaise/fatigue.  Respiratory: Negative for cough, shortness of breath and wheezing.   Cardiovascular: Negative for chest pain and orthopnea.  Gastrointestinal: Negative.   Endo/Heme/Allergies: Negative.   Psychiatric/Behavioral: Negative for depression, hallucinations, memory loss, substance abuse and suicidal ideas. The patient has insomnia (occ, much improved.). The patient is not nervous/anxious.     Objective:  LMP 03/15/2017   Physical Exam  Constitutional: She is oriented to person, place, and time and well-developed, well-nourished, and in no distress.  HENT:  Head: Normocephalic and atraumatic.  Neck: No thyromegaly present.  Cardiovascular: Normal rate, regular rhythm and normal heart sounds.  Pulmonary/Chest: Effort normal and breath sounds normal.  Neurological: She is alert and oriented to person, place, and time. Gait normal. GCS score is 15.  Skin: Skin is warm and dry.  Psychiatric: Mood, memory, affect and judgment normal.    Assessment and Plan :  1. Gastroesophageal reflux disease, esophagitis presence not specified   2. Fibromyalgia   3. Anxiety, generalized Pt looks great today. She is getting counseling.She wishes to eventually get off of Klonopin and has cut dose in half. Has been on this for about 15 years. Cut Rx back from 1 mg to 0.5 mg and will cut those in half. Hope to discontinue over next few months.  4. Attention deficit hyperactivity disorder (ADHD), combined type   5. Attention deficit hyperactivity disorder (ADHD), other type RF times 3. - amphetamine-dextroamphetamine (ADDERALL) 20 MG tablet; Take 1 tablet (20 mg total) by mouth 2 (two) times daily.  Dispense: 60 tablet; Refill: 0  I have done the exam and reviewed the chart and it is accurate to the best of my knowledge. Dentist has been used and  any errors in dictation or transcription are unintentional. Julieanne Manson M.D. [facility]

## 2017-04-16 ENCOUNTER — Other Ambulatory Visit: Payer: Self-pay | Admitting: Physician Assistant

## 2017-04-16 DIAGNOSIS — N760 Acute vaginitis: Secondary | ICD-10-CM

## 2017-04-16 DIAGNOSIS — T3695XA Adverse effect of unspecified systemic antibiotic, initial encounter: Principal | ICD-10-CM

## 2017-04-16 DIAGNOSIS — J0100 Acute maxillary sinusitis, unspecified: Secondary | ICD-10-CM

## 2017-04-16 DIAGNOSIS — B379 Candidiasis, unspecified: Secondary | ICD-10-CM

## 2017-04-16 DIAGNOSIS — B9689 Other specified bacterial agents as the cause of diseases classified elsewhere: Secondary | ICD-10-CM

## 2017-05-02 ENCOUNTER — Telehealth: Payer: Self-pay | Admitting: Family Medicine

## 2017-05-02 NOTE — Telephone Encounter (Signed)
Pt called saying [facility]? is requesting new order for [place].  SHe has two new places on her breast and her [place] is not schd until August.  Does she need to come in or can you just send over an order.  They did schd her for Thursday May 2 at 8:15. To get an [place] just needs the order sent over.  Pt's call back is [phone]  Thanks Barth Kirks

## 2017-05-02 NOTE — Telephone Encounter (Signed)
Maralyn Sago,  Do you have these forms?  Do I need to order in epic as well for [facility]?  JB

## 2017-05-03 NOTE — Telephone Encounter (Signed)
Per [name] they will need an order for diagnostic mammogram,ultrasound if needed.Order sent to [facility] to sign

## 2017-05-09 NOTE — Telephone Encounter (Signed)
Order faxed.

## 2017-05-13 ENCOUNTER — Telehealth: Payer: Self-pay | Admitting: Family Medicine

## 2017-05-13 DIAGNOSIS — F908 Attention-deficit hyperactivity disorder, other type: Secondary | ICD-10-CM

## 2017-05-13 MED ORDER — AMPHETAMINE-DEXTROAMPHETAMINE 20 MG PO TABS: 20.0000 mg | ORAL_TABLET | Freq: Two times a day (BID) | ORAL | 0 refills | Status: DC

## 2017-05-13 NOTE — Telephone Encounter (Signed)
Dr. Sullivan Lone is out of office, can you please review? KW

## 2017-05-13 NOTE — Telephone Encounter (Signed)
Left message advising [facility] to [facility] rx, and LM to advise pt that new rx was sent.

## 2017-05-13 NOTE — Telephone Encounter (Signed)
[facility] [place]  Please let patient know.  Also have [facility] [place] cancel that Rx please.  Erasmo Downer, MD, MPH [facility] 05/13/2017 3:47 PM

## 2017-05-13 NOTE — Telephone Encounter (Signed)
H[facility] [place] states they are out and on back order of the Adderall 20 MG and will need [place] to send a new RX [facility] [place].  Pt is out of medication.

## 2017-05-20 ENCOUNTER — Encounter: Payer: Self-pay | Admitting: Physician Assistant

## 2017-05-20 ENCOUNTER — Telehealth: Payer: Self-pay

## 2017-05-20 ENCOUNTER — Ambulatory Visit
Admission: RE | Admit: 2017-05-20 | Discharge: 2017-05-20 | Disposition: A | Discharge status: Home / Self Care | Payer: BLUE CROSS/BLUE SHIELD | Source: Ambulatory Visit | Attending: Physician Assistant | Admitting: Physician Assistant

## 2017-05-20 ENCOUNTER — Ambulatory Visit (INDEPENDENT_AMBULATORY_CARE_PROVIDER_SITE_OTHER): Payer: BLUE CROSS/BLUE SHIELD | Admitting: Physician Assistant

## 2017-05-20 VITALS — BP 100/70 | HR 57 | Temp 98.0°F | Resp 16 | Wt 126.0 lb

## 2017-05-20 DIAGNOSIS — R140 Abdominal distension (gaseous): Secondary | ICD-10-CM

## 2017-05-20 DIAGNOSIS — R5383 Other fatigue: Secondary | ICD-10-CM

## 2017-05-20 DIAGNOSIS — R7401 Elevation of levels of liver transaminase levels: Secondary | ICD-10-CM

## 2017-05-20 DIAGNOSIS — K279 Peptic ulcer, site unspecified, unspecified as acute or chronic, without hemorrhage or perforation: Secondary | ICD-10-CM

## 2017-05-20 DIAGNOSIS — R17 Unspecified jaundice: Secondary | ICD-10-CM | POA: Insufficient documentation

## 2017-05-20 DIAGNOSIS — R740 Nonspecific elevation of levels of transaminase and lactic acid dehydrogenase [LDH]: Secondary | ICD-10-CM

## 2017-05-20 DIAGNOSIS — K5900 Constipation, unspecified: Secondary | ICD-10-CM

## 2017-05-20 NOTE — Patient Instructions (Signed)

## 2017-05-20 NOTE — Progress Notes (Signed)
Patient: Krystal Marks Female    DOB: 1975-08-14   41 y.o.   MRN: [id number] Visit Date: 05/20/2017  Today's Provider: Margaretann Loveless, PA-C   Chief Complaint  Patient presents with  . Abdominal Pain   Subjective:    HPI Patient here today C/O abdominal bloating, gas, constipation x's 1 week daily. Patient reports symptoms have been on and off x's 3 weeks total. Patient reports taking OTC Digest two days and has not helped. Patient reports adding Migra-T 3 weeks ago to prevent migraine headaches daily. Patient reports she has always had this problem of being food intolerant. Patient reports that her gas smells like "sulfur." Patient reports she has kept a food diary in the past. Patient reports her stool changes from a solid form to diarrhea to pencil thin. Patient denies any blood in stool.  Patient reports she tried to do a migraine study but did not qualify due to abnormal lab results. Patient reports her bilirubin was abnormal even after drinking a lot of water.  _  Allergies  Allergen Reactions  . Duloxetine Nausea And Vomiting    CAN NOT TAKE WITH IMITREX   . Iodinated Diagnostic Agents Itching    Omnpaque i 300, 10 min delayed reaction of large hives, itching & urticaria// will require 13hr prep for future iv contrast,//a.calhoun  . Meperidine     Other reaction(s): Respiratory Distress  . Dilaudid [Hydromorphone]     Hard times breathing     Current Outpatient Medications:  .  acyclovir (ZOVIRAX) 400 MG tablet, Take 1 tablet by mouth as needed., Disp: , Rfl: 1 .  amphetamine-dextroamphetamine (ADDERALL) 20 MG tablet, Take 1 tablet (20 mg total) by mouth 2 (two) times daily., Disp: 60 tablet, Rfl: 0 .  Aspirin-Acetaminophen-Caffeine (TGT MIGRAINE RELIEF PO), Take 2 tablets by mouth daily., Disp: , Rfl:  .  clonazePAM (KLONOPIN) 0.5 MG tablet, Take 1 tablet (0.5 mg total) by mouth 2 (two) times daily as needed for anxiety., Disp: 60 tablet, Rfl: 2 .   loratadine (CLARITIN) 10 MG tablet, Take 10 mg by mouth daily., Disp: , Rfl:  .  Magnesium 300 MG CAPS, Take by mouth., Disp: , Rfl:  .  Omega-3 Fatty Acids (FISH OIL) 1000 MG CPDR, Take by mouth., Disp: , Rfl:  .  OVER THE COUNTER MEDICATION, , Disp: , Rfl:  .  SUMAtriptan (IMITREX) 100 MG tablet, Take 1 tablet by mouth as needed., Disp: , Rfl: 11 .  UNABLE TO FIND, Place 1 application under the tongue at bedtime. Med Name: CBD oil with Frankincense , Disp: , Rfl:  .  vitamin B-12 (CYANOCOBALAMIN) 1000 MCG tablet, Take 1,000 mcg by mouth daily., Disp: , Rfl:   Review of Systems  Constitutional: Negative.   Respiratory: Negative.   Gastrointestinal: Positive for abdominal distention and constipation.  Neurological: Positive for headaches.    Social History   Tobacco Use  . Smoking status: Current Every Day Smoker    Packs/day: 0.50  . Smokeless tobacco: Never Used  . Tobacco comment: 5-6 cigarettes daily  Substance Use Topics  . Alcohol use: Yes    Alcohol/week: 2.4 oz    Types: 4 Glasses of wine per week   Objective:   BP 100/70 (BP Location: Left Arm, Patient Position: Sitting, Cuff Size: Normal)   Pulse (!) 57   Temp 98 F (36.7 C) (Oral)   Resp 16   Wt 126 lb (57.2 kg)   LMP 05/07/2017 (Exact  Date)   SpO2 99%   BMI 20.34 kg/m  Vitals:   05/20/17 1121  BP: 100/70  Pulse: (!) 57  Resp: 16  Temp: 98 F (36.7 C)  TempSrc: Oral  SpO2: 99%  Weight: 126 lb (57.2 kg)     Physical Exam  Constitutional: She is oriented to person, place, and time. She appears well-developed and well-nourished. No distress.  Cardiovascular: Normal rate, regular rhythm and normal heart sounds. Exam reveals no gallop and no friction rub.  No murmur heard. Pulmonary/Chest: Effort normal and breath sounds normal. No respiratory distress. She has no wheezes. She has no rales.  Abdominal: Soft. Normal appearance. She exhibits distension. She exhibits no fluid wave, no ascites and no mass.  Bowel sounds are decreased. There is no hepatosplenomegaly. There is generalized tenderness. There is no rebound, no guarding and no CVA tenderness.  Suprapubic tenderness  Neurological: She is alert and oriented to person, place, and time.  Skin: Skin is warm and dry. She is not diaphoretic.  Vitals reviewed.       Assessment & Plan:     1. Abdominal bloating Worsening x 1 week but occurring on and off for 4-6 weeks. She reports having an elevated total bilirubin in March during a work up period for a migraine study. She reports being rejected to the study for this. In January, however, her bilirubin was normal. Will recheck labs and get imaging as below. Suspect chronic constipation but want to check liver and r/o food intolerance at patient request. I will f/u pending results. She is to call if symptoms worsen. - DG Abd 2 Views; Future - CBC w/Diff/Platelet - Hepatic function panel - Allergen Profile, Basic Food  2. Constipation, unspecified constipation type See above medical treatment plan. - DG Abd 2 Views; Future - CBC w/Diff/Platelet - Hepatic function panel - Allergen Profile, Basic Food  3. Gastroduodenal ulcer Previously stable on prilosec. Patient no longer taking prilosec but states this feels different and she never had bloating, just pain.   4. Elevated bilirubin See above medical treatment plan. - DG Abd 2 Views; Future - CBC w/Diff/Platelet - Hepatic function panel  5. Fatigue, unspecified type See above medical treatment plan. - CBC w/Diff/Platelet - Hepatic function panel       Margaretann Loveless, PA-C  [facility]

## 2017-05-20 NOTE — Telephone Encounter (Signed)
-----   Message from Margaretann Loveless, PA-C sent at 05/20/2017  4:42 PM EDT ----- No constipation or stool burden. No abnormal bowel gas pattern. Will await lab results to decide if further imaging required.

## 2017-05-20 NOTE — Telephone Encounter (Signed)
Patient advised as below. Work note printed to be excused until 05/23/17.

## 2017-05-23 NOTE — Addendum Note (Signed)
Addended by: Margaretann Loveless on: 05/23/2017 08:58 AM   Modules accepted: Orders

## 2017-05-25 ENCOUNTER — Emergency Department
Admission: EM | Admit: 2017-05-25 | Discharge: 2017-05-25 | Disposition: A | Discharge status: Home / Self Care | Payer: BLUE CROSS/BLUE SHIELD | Attending: Emergency Medicine | Admitting: Emergency Medicine

## 2017-05-25 ENCOUNTER — Other Ambulatory Visit: Payer: Self-pay

## 2017-05-25 DIAGNOSIS — F10929 Alcohol use, unspecified with intoxication, unspecified: Secondary | ICD-10-CM | POA: Diagnosis not present

## 2017-05-25 DIAGNOSIS — Z79899 Other long term (current) drug therapy: Secondary | ICD-10-CM | POA: Insufficient documentation

## 2017-05-25 DIAGNOSIS — T424X2A Poisoning by benzodiazepines, intentional self-harm, initial encounter: Secondary | ICD-10-CM | POA: Insufficient documentation

## 2017-05-25 DIAGNOSIS — F1510 Other stimulant abuse, uncomplicated: Secondary | ICD-10-CM

## 2017-05-25 DIAGNOSIS — F1310 Sedative, hypnotic or anxiolytic abuse, uncomplicated: Secondary | ICD-10-CM

## 2017-05-25 DIAGNOSIS — F1010 Alcohol abuse, uncomplicated: Secondary | ICD-10-CM

## 2017-05-25 DIAGNOSIS — T424X1D Poisoning by benzodiazepines, accidental (unintentional), subsequent encounter: Secondary | ICD-10-CM | POA: Diagnosis not present

## 2017-05-25 DIAGNOSIS — F988 Other specified behavioral and emotional disorders with onset usually occurring in childhood and adolescence: Secondary | ICD-10-CM | POA: Diagnosis present

## 2017-05-25 DIAGNOSIS — F17200 Nicotine dependence, unspecified, uncomplicated: Secondary | ICD-10-CM | POA: Insufficient documentation

## 2017-05-25 DIAGNOSIS — T424X1A Poisoning by benzodiazepines, accidental (unintentional), initial encounter: Secondary | ICD-10-CM

## 2017-05-25 DIAGNOSIS — F451 Undifferentiated somatoform disorder: Secondary | ICD-10-CM

## 2017-05-25 HISTORY — DX: Poisoning by benzodiazepines, accidental (unintentional), initial encounter: T42.4X1A

## 2017-05-25 HISTORY — DX: Depression, unspecified: F32.A

## 2017-05-25 HISTORY — DX: Sedative, hypnotic or anxiolytic abuse, uncomplicated: F13.10

## 2017-05-25 HISTORY — DX: Undifferentiated somatoform disorder: F45.1

## 2017-05-25 HISTORY — DX: Major depressive disorder, single episode, unspecified: F32.9

## 2017-05-25 HISTORY — DX: Alcohol abuse, uncomplicated: F10.10

## 2017-05-25 LAB — URINALYSIS, COMPLETE (UACMP) WITH MICROSCOPIC
BILIRUBIN URINE: NEGATIVE
Glucose, UA: NEGATIVE mg/dL
KETONES UR: NEGATIVE mg/dL
Leukocytes, UA: NEGATIVE
NITRITE: NEGATIVE
PROTEIN: NEGATIVE mg/dL
Specific Gravity, Urine: 1.003 — ABNORMAL LOW (ref 1.005–1.030)
pH: 6 (ref 5.0–8.0)

## 2017-05-25 LAB — HCG, QUANTITATIVE, PREGNANCY

## 2017-05-25 LAB — CBC WITH DIFFERENTIAL/PLATELET
BASOS PCT: 1 %
Basophils Absolute: 0.1 10*3/uL (ref 0–0.1)
EOS ABS: 0.1 10*3/uL (ref 0–0.7)
Eosinophils Relative: 1 %
HEMATOCRIT: 40.5 % (ref 35.0–47.0)
HEMOGLOBIN: 14 g/dL (ref 12.0–16.0)
Lymphocytes Relative: 33 %
Lymphs Abs: 2.8 10*3/uL (ref 1.0–3.6)
MCH: 32.8 pg (ref 26.0–34.0)
MCHC: 34.5 g/dL (ref 32.0–36.0)
MCV: 94.9 fL (ref 80.0–100.0)
Monocytes Absolute: 0.6 10*3/uL (ref 0.2–0.9)
Monocytes Relative: 7 %
NEUTROS ABS: 4.9 10*3/uL (ref 1.4–6.5)
NEUTROS PCT: 58 %
Platelets: 241 10*3/uL (ref 150–440)
RBC: 4.27 MIL/uL (ref 3.80–5.20)
RDW: 13.1 % (ref 11.5–14.5)
WBC: 8.4 10*3/uL (ref 3.6–11.0)

## 2017-05-25 LAB — COMPREHENSIVE METABOLIC PANEL
ALBUMIN: 4 g/dL (ref 3.5–5.0)
ALK PHOS: 46 U/L (ref 38–126)
ALT: 31 U/L (ref 14–54)
ANION GAP: 6 (ref 5–15)
AST: 24 U/L (ref 15–41)
BILIRUBIN TOTAL: 0.6 mg/dL (ref 0.3–1.2)
BUN: 9 mg/dL (ref 6–20)
CALCIUM: 8.8 mg/dL — AB (ref 8.9–10.3)
CO2: 26 mmol/L (ref 22–32)
Chloride: 110 mmol/L (ref 101–111)
Creatinine, Ser: 0.55 mg/dL (ref 0.44–1.00)
GFR calc non Af Amer: >60 mL/min (ref >60)
GLUCOSE: 80 mg/dL (ref 65–99)
POTASSIUM: 3.5 mmol/L (ref 3.5–5.1)
SODIUM: 142 mmol/L (ref 135–145)
TOTAL PROTEIN: 7.3 g/dL (ref 6.5–8.1)

## 2017-05-25 LAB — URINE DRUG SCREEN, QUALITATIVE (ARMC ONLY)
Amphetamines, Ur Screen: POSITIVE — AB
BENZODIAZEPINE, UR SCRN: POSITIVE — AB
Barbiturates, Ur Screen: NOT DETECTED
Cannabinoid 50 Ng, Ur ~~LOC~~: NOT DETECTED
Cocaine Metabolite,Ur ~~LOC~~: NOT DETECTED
MDMA (Ecstasy)Ur Screen: NOT DETECTED
METHADONE SCREEN, URINE: NOT DETECTED
Opiate, Ur Screen: NOT DETECTED
Phencyclidine (PCP) Ur S: NOT DETECTED
TRICYCLIC, UR SCREEN: NOT DETECTED

## 2017-05-25 LAB — ETHANOL: Alcohol, Ethyl (B): 171 mg/dL — ABNORMAL HIGH (ref <10)

## 2017-05-25 LAB — ACETAMINOPHEN LEVEL

## 2017-05-25 LAB — CK: CK TOTAL: 55 U/L (ref 38–234)

## 2017-05-25 LAB — SALICYLATE LEVEL: Salicylate Lvl: <7 mg/dL (ref 2.8–30.0)

## 2017-05-25 MED ORDER — MIDAZOLAM HCL 50 MG/10ML IJ SOLN
0.5000 mg/h | INTRAMUSCULAR | Status: DC
  Filled 2017-05-25: qty 10

## 2017-05-25 MED ORDER — SODIUM CHLORIDE 0.9 % IV BOLUS
1000.0000 mL | Freq: Once | INTRAVENOUS | Status: AC
  Administered 2017-05-25: 1000 mL via INTRAVENOUS

## 2017-05-25 NOTE — ED Notes (Signed)
Pt given phone to speak with her brother.

## 2017-05-25 NOTE — ED Provider Notes (Signed)
[facility] Emergency Department Provider Note  ____________________________________________   First MD Initiated Contact with Patient 05/25/17 0207     (approximate)  I have reviewed the triage vital signs and the nursing notes.   HISTORY  Chief Complaint Drug Overdose  Level 5 exemption history limited by the patient's clinical condition  HPI Krystal Marks is a 42 y.o. female who comes to the emergency department via EMS after an alleged Xanax overdose.  History is challenging to obtain as the patient is not very forthcoming with events.  Apparently she lives alone and friends came to visit her today and noted that she was somewhat somnolent reported taking 11 tablets of an unknown dose of Xanax.  They called EMS who transported her with normal vital signs and a normal blood sugar.  The patient is quietly crying in bed and unwilling to speak any further.  Past Medical History:  Diagnosis Date  . Anxiety   . Depression   . Fibromyalgia   . GERD (gastroesophageal reflux disease)     Patient Active Problem List   Diagnosis Date Noted  . 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 Fatigue 01/20/2017  . Fibromyalgia 01/12/2016  . ADD (attention deficit disorder) 08/16/2014  . Cigarette smoker 08/16/2014  . Depression, major, single episode, moderate (HCC) 08/16/2014  . Anxiety, generalized 08/16/2014  . Acid reflux 08/16/2014  . Irregular bleeding 08/16/2014  . Cannot sleep 08/16/2014  . Migraine 08/16/2014  . Gastroduodenal ulcer 08/16/2014  . Anxiety 06/18/2014  . NONSPECIFIC ABNORM RESULTS OTH SPEC FUNCT STUDY 09/28/2007    Past Surgical History:  Procedure Laterality Date  . BREAST ENHANCEMENT SURGERY  2007  . SHOULDER ARTHROSCOPY Right     Prior to Admission medications   Medication Sig Start Date End Date Taking? Authorizing Provider  acyclovir (ZOVIRAX) 400 MG tablet Take 1 tablet by  mouth as needed. 03/17/16  Yes [provider]  amphetamine-dextroamphetamine (ADDERALL) 20 MG tablet Take 1 tablet (20 mg total) by mouth 2 (two) times daily. 05/13/17  Yes Bacigalupo, Marzella Schlein, MD  Aspirin-Acetaminophen-Caffeine (TGT MIGRAINE RELIEF PO) Take 2 tablets by mouth daily.   Yes [provider]  clonazePAM (KLONOPIN) 0.5 MG tablet Take 1 tablet (0.5 mg total) by mouth 2 (two) times daily as needed for anxiety. 04/11/17  Yes Maple Hudson., MD  loratadine (CLARITIN) 10 MG tablet Take 10 mg by mouth daily.   Yes [provider]  Magnesium 300 MG CAPS Take by mouth.   Yes [provider]  Omega-3 Fatty Acids (FISH OIL) 1000 MG CPDR Take by mouth.   Yes [provider]  OVER THE COUNTER MEDICATION    Yes [provider]  SUMAtriptan (IMITREX) 100 MG tablet Take 1 tablet by mouth as needed. 03/31/16  Yes [provider]  UNABLE TO FIND Place 1 application under the tongue at bedtime. Med Name: CBD oil with Frankincense    Yes [provider]  vitamin B-12 (CYANOCOBALAMIN) 1000 MCG tablet Take 1,000 mcg by mouth daily.   Yes [provider]    Allergies Duloxetine; Iodinated diagnostic agents; Meperidine; and Dilaudid [hydromorphone]  Family History  Problem Relation Age of Onset  . Osteoporosis Maternal Grandmother   . Esophageal cancer Maternal Grandfather     Social History Social History   Tobacco Use  . Smoking status: Current Every Day Smoker    Packs/day: 0.50  . Smokeless tobacco: Never Used  . Tobacco comment:  5-6 cigarettes daily  Substance Use Topics  . Alcohol use: Yes    Alcohol/week: 2.4 oz    Types: 4 Glasses of wine per week  . Drug use: No    Review of Systems Level 5 exemption history limited by the patient's clinical condition  ____________________________________________   PHYSICAL EXAM:  VITAL SIGNS: ED Triage Vitals  Enc Vitals Group     BP      Pulse       Resp      Temp      Temp src      SpO2      Weight      Height      Head Circumference      Peak Flow      Pain Score      Pain Loc      Pain Edu?      Excl. in GC?     Constitutional: Sitting very still in bed quietly crying with her hands covering her face Eyes: PERRL EOMI. midrange and brisk Head: Atraumatic. Nose: No congestion/rhinnorhea. Mouth/Throat: No trismus Neck: No stridor.   Cardiovascular: Normal rate, regular rhythm. Grossly normal heart sounds.  Good peripheral circulation. Respiratory: Normal respiratory effort.  No retractions. Lungs CTAB and moving good air Gastrointestinal: Soft nontender Musculoskeletal: No lower extremity edema   Neurologic:  Normal speech and language. No gross focal neurologic deficits are appreciated. Skin:  Skin is warm, dry and intact. No rash noted. Psychiatric: Sad and depressed affect    ____________________________________________   DIFFERENTIAL includes but not limited to  Alcohol intoxication, drug overdose, depression, suicide attempt, metabolic derangement ____________________________________________   LABS (all labs ordered are listed, but only abnormal results are displayed)  Labs Reviewed  ACETAMINOPHEN LEVEL - Abnormal; Notable for the following components:      Result Value   Acetaminophen (Tylenol), Serum <10 (*)    All other components within normal limits  ETHANOL - Abnormal; Notable for the following components:   Alcohol, Ethyl (B) 171 (*)    All other components within normal limits  COMPREHENSIVE METABOLIC PANEL - Abnormal; Notable for the following components:   Calcium 8.8 (*)    All other components within normal limits  URINALYSIS, COMPLETE (UACMP) WITH MICROSCOPIC - Abnormal; Notable for the following components:   Color, Urine STRAW (*)    APPearance CLEAR (*)    Specific Gravity, Urine 1.003 (*)    Hgb urine dipstick SMALL (*)    Bacteria, UA MANY (*)    All other components within normal  limits  URINE DRUG SCREEN, QUALITATIVE ([facility] ONLY) - Abnormal; Notable for the following components:   Amphetamines, Ur Screen POSITIVE (*)    Benzodiazepine, Ur Scrn POSITIVE (*)    All other components within normal limits  SALICYLATE LEVEL  CBC WITH DIFFERENTIAL/PLATELET  CK  HCG, QUANTITATIVE, PREGNANCY    Lab work reviewed by me shows a number of abnormalities most notably elevated ethanol level and benzodiazepine and amphetamine positive __________________________________________  EKG   ____________________________________________  RADIOLOGY   ____________________________________________   PROCEDURES  Procedure(s) performed: o  Procedures  Critical Care performed: no  Observation: no ____________________________________________   INITIAL IMPRESSION / ASSESSMENT AND PLAN / ED COURSE  Pertinent labs & imaging results that were available during my care of the patient were reviewed by me and considered in my medical decision making (see chart for details).  The patient arrives tearful although clearly protecting her airway.  She does report taking Xanax  but is unwilling to discuss any further.  I have placed her under involuntary commitment given the likelihood of intentional overdose.  ----------------------------------------- 3:37 AM on 05/25/2017 -----------------------------------------  At this point the patient is medically stable for psychiatric evaluation.      ____________________________________________   FINAL CLINICAL IMPRESSION(S) / ED DIAGNOSES  Final diagnoses:  Alcoholic intoxication with complication (HCC)  Intentional benzodiazepine overdose, initial encounter (HCC)  Amphetamine abuse (HCC)      NEW MEDICATIONS STARTED DURING THIS VISIT:  New Prescriptions   No medications on file     Note:  This document was prepared using Dragon voice recognition software and may include unintentional dictation errors.     Merrily Brittle, MD 05/25/17 [phone]

## 2017-05-25 NOTE — ED Notes (Signed)
Pt's brother in waiting room, notified him that we will allow him to visit as soon after noon as possible.

## 2017-05-25 NOTE — ED Notes (Signed)
Pt gave permission for [place] to share any information about her with her father

## 2017-05-25 NOTE — BH Assessment (Addendum)
TTS attempted to complete assessment with pt. Pt was awake however, she was extremely groggy and too intoxicated to answer questions appropriately. Will reassess in a few hours. Pt denies SI/HI.

## 2017-05-25 NOTE — Consult Note (Signed)
[facility] Consult   Reason for Consult: Consult for 42 year old woman brought to the emergency room after friends found her unconscious Referring Physician: Mariea Clonts Patient Identification: Krystal Marks MRN:  [id number] Principal Diagnosis: Benzodiazepine overdose Diagnosis:   Patient Active Problem List   Diagnosis Date Noted  . Benzodiazepine abuse ([place]) [F13.10] 05/25/2017  . Alcohol abuse [F10.10] 05/25/2017  . Benzodiazepine overdose [T42.4X1A] 05/25/2017  . 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 Attention deficit hyperactivity disorder, predominantly inattentive type [F90.0] 01/20/2017  . Fatigue [R53.83] 01/20/2017  . Fibromyalgia [M79.7] 01/12/2016  . ADD (attention deficit disorder) [F98.8] 08/16/2014  . Cigarette smoker [F17.210] 08/16/2014  . Depression, major, single episode, moderate ([place]) [F32.1] 08/16/2014  . Anxiety, generalized [F41.1] 08/16/2014  . Acid reflux [K21.9] 08/16/2014  . Irregular bleeding [N92.6] 08/16/2014  . Cannot sleep [G47.00] 08/16/2014  . Migraine [G43.909] 08/16/2014  . Gastroduodenal ulcer [K27.9] 08/16/2014  . Anxiety [F41.9] 06/18/2014  . NONSPECIFIC ABNORM RESULTS OTH SPEC FUNCT STUDY [R94.8] 09/28/2007    Total Time spent with patient: 1 hour  Subjective:   Krystal Marks is a 42 y.o. female patient admitted with "I have been under a lot of stress".  HPI: Patient interviewed chart reviewed.  65 year old woman who was found by friends last night.  She called them up but when they came over they found her difficult to arouse very sleepy.  Evidence that she had taken some extra Xanax.  Patient says that she remembers taking approximately 9 tablets of Xanax last night.  These were pills that are not prescribed to her.  She said that she was feeling like she desperately needed to get some sleep.  She remembers taking 4 of them at first and then after not falling asleep taking 5 more later.  Does not remember  people coming over or coming into the hospital.  Patient admits that she had been drinking last night as well.  Patient denies any suicidal wish or intent at all.  Admits that her mood stays anxious and down much of the time.  Most of her history focuses on her worries about multiple somatic symptoms.  She has been suffering from "a bloated belly" for the last 2 or 3 weeks.  She has come to the conclusion that she might have ovarian cancer.  She has an appointment to see somebody about it next week but this is making her extremely anxious.  I should emphasize that there is no direct evidence that she has cancer.  Patient is very focused on somatic symptoms.  She uses a large number of supplements and non-prescribed medicines for a variety of diagnoses including fibromyalgia.  Patient says also that she stays fatigued all the time despite the high dose of Adderall that she takes.  Social history: Lives by herself.  Has occasional contact with her family.  Lost her job last fall after a dispute at work.  Medical history: As noted above the patient is very somatic with her concerns with complains of abdominal swelling and pain complaints of fatigue difficulty sleeping.  Substance abuse history: Patient minimizes it but it sounds like she is drinking too much.  She has a past history of excessive alcohol and when she describes the amount she is drinking now she describes it in terms of glass is full of tequila.  She is drinking several times a week now.  Has a past use of cocaine but says she is not taking it now.  Currently prescribed Adderall and Klonopin but  not the Xanax so that is something she got illegally as well.  Past Psychiatric History: Patient has been treated for depression in the past.  She is very resistant to the idea of getting medicalized treatment for depression says that antidepressants all make her feel worse.  She talks about how she wants to treat everything "holistic lay" while  minimizing her use of amphetamine sedatives and alcohol.  Patient does apparently see a Marketing executive regularly and feels that is helpful.  No history of suicide attempts reported denies psychiatric hospitalization  Risk to Self: Is patient at risk for suicide?: No Risk to Others:   Prior Inpatient Therapy:   Prior Outpatient Therapy:    Past Medical History:  Past Medical History:  Diagnosis Date  . Anxiety   . Depression   . Fibromyalgia   . GERD (gastroesophageal reflux disease)     Past Surgical History:  Procedure Laterality Date  . BREAST ENHANCEMENT SURGERY  2007  . SHOULDER ARTHROSCOPY Right    Family History:  Family History  Problem Relation Age of Onset  . Osteoporosis Maternal Grandmother   . Esophageal cancer Maternal Grandfather    Family Psychiatric  History: Mother has depression Social History:  Social History   Substance and Sexual Activity  Alcohol Use Yes  . Alcohol/week: 2.4 oz  . Types: 4 Glasses of wine per week     Social History   Substance and Sexual Activity  Drug Use No    Social History   Socioeconomic History  . Marital status: Single    Spouse name: Not on file  . Number of children: Not on file  . Years of education: Not on file  . Highest education level: Not on file  Occupational History  . Not on file  Social Needs  . Financial resource strain: Not on file  . Food insecurity:    Worry: Not on file    Inability: Not on file  . Transportation needs:    Medical: Not on file    Non-medical: Not on file  Tobacco Use  . Smoking status: Current Every Day Smoker    Packs/day: 0.50  . Smokeless tobacco: Never Used  . Tobacco comment: 5-6 cigarettes daily  Substance and Sexual Activity  . Alcohol use: Yes    Alcohol/week: 2.4 oz    Types: 4 Glasses of wine per week  . Drug use: No  . Sexual activity: Not on file  Lifestyle  . Physical activity:    Days per week: Not on file    Minutes per session: Not on file  .  Stress: Not on file  Relationships  . Social connections:    Talks on phone: Not on file    Gets together: Not on file    Attends religious service: Not on file    Active member of club or organization: Not on file    Attends meetings of clubs or organizations: Not on file    Relationship status: Not on file  Other Topics Concern  . Not on file  Social History Narrative  . Not on file   Additional Social History:    Allergies:   Allergies  Allergen Reactions  . Duloxetine Nausea And Vomiting    CAN NOT TAKE WITH IMITREX   . Iodinated Diagnostic Agents Itching    Omnpaque i 300, 10 min delayed reaction of large hives, itching & urticaria// will require 13hr prep for future iv contrast,//a.calhoun  . Meperidine  Other reaction(s): Respiratory Distress  . Dilaudid [Hydromorphone]     Hard times breathing    Labs:  Results for orders placed or performed during the hospital encounter of 05/25/17 (from the past 48 hour(s))  Urinalysis, Complete w Microscopic     Status: Abnormal   Collection Time: 05/25/17  2:10 AM  Result Value Ref Range   Color, Urine STRAW (A) YELLOW   APPearance CLEAR (A) CLEAR   Specific Gravity, Urine 1.003 (L) 1.005 - 1.030   pH 6.0 5.0 - 8.0   Glucose, UA NEGATIVE NEGATIVE mg/dL   Hgb urine dipstick SMALL (A) NEGATIVE   Bilirubin Urine NEGATIVE NEGATIVE   Ketones, ur NEGATIVE NEGATIVE mg/dL   Protein, ur NEGATIVE NEGATIVE mg/dL   Nitrite NEGATIVE NEGATIVE   Leukocytes, UA NEGATIVE NEGATIVE   RBC / HPF 0-5 0 - 5 RBC/hpf   WBC, UA 0-5 0 - 5 WBC/hpf   Bacteria, UA MANY (A) NONE SEEN   Squamous Epithelial / LPF 0-5 0 - 5    Comment: Performed at [facility], [address], [place], [place] [address]  Urine Drug Screen, Qualitative     Status: Abnormal   Collection Time: 05/25/17  2:10 AM  Result Value Ref Range   Tricyclic, Ur Screen NONE DETECTED NONE DETECTED   Amphetamines, Ur Screen POSITIVE (A) NONE DETECTED   MDMA (Ecstasy)Ur  Screen NONE DETECTED NONE DETECTED   Cocaine Metabolite,Ur [place] NONE DETECTED NONE DETECTED   Opiate, Ur Screen NONE DETECTED NONE DETECTED   Phencyclidine (PCP) Ur S NONE DETECTED NONE DETECTED   Cannabinoid 50 Ng, Ur [place] NONE DETECTED NONE DETECTED   Barbiturates, Ur Screen NONE DETECTED NONE DETECTED   Benzodiazepine, Ur Scrn POSITIVE (A) NONE DETECTED   Methadone Scn, Ur NONE DETECTED NONE DETECTED    Comment: (NOTE) Tricyclics + metabolites, urine    Cutoff 1000 ng/mL Amphetamines + metabolites, urine  Cutoff 1000 ng/mL MDMA (Ecstasy), urine              Cutoff 500 ng/mL Cocaine Metabolite, urine          Cutoff 300 ng/mL Opiate + metabolites, urine        Cutoff 300 ng/mL Phencyclidine (PCP), urine         Cutoff 25 ng/mL Cannabinoid, urine                 Cutoff 50 ng/mL Barbiturates + metabolites, urine  Cutoff 200 ng/mL Benzodiazepine, urine              Cutoff 200 ng/mL Methadone, urine                   Cutoff 300 ng/mL The urine drug screen provides only a preliminary, unconfirmed analytical test result and should not be used for non-medical purposes. Clinical consideration and professional judgment should be applied to any positive drug screen result due to possible interfering substances. A more specific alternate chemical method must be used in order to obtain a confirmed analytical result. Gas chromatography / mass spectrometry (GC/MS) is the preferred confirmat ory method. Performed at [facility], [address] [place], [place] [address]   hCG, quantitative, pregnancy     Status: None   Collection Time: 05/25/17  2:10 AM  Result Value Ref Range   hCG, Beta Chain, Quant, S <1 <5 mIU/mL    Comment:          GEST. AGE      CONC.  (mIU/mL)   <=  1 WEEK        5 - 50     2 WEEKS       50 - 500     3 WEEKS       100 - 10,000     4 WEEKS     1,000 - 30,000     5 WEEKS     3,500 - 115,000   6-8 WEEKS     12,000 - 270,000    12 WEEKS     15,000 - 220,000         FEMALE AND NON-PREGNANT FEMALE:     LESS THAN 5 mIU/mL Performed at [facility], [address] [place], [place] [address]   Acetaminophen level     Status: Abnormal   Collection Time: 05/25/17  2:18 AM  Result Value Ref Range   Acetaminophen (Tylenol), Serum <10 (L) 10 - 30 ug/mL    Comment: (NOTE) Therapeutic concentrations vary significantly. A range of 10-30 ug/mL  may be an effective concentration for many patients. However, some  are best treated at concentrations outside of this range. Acetaminophen concentrations >150 ug/mL at 4 hours after ingestion  and >50 ug/mL at 12 hours after ingestion are often associated with  toxic reactions. Performed at [facility], [address] [place], [place] [address]   Ethanol     Status: Abnormal   Collection Time: 05/25/17  2:18 AM  Result Value Ref Range   Alcohol, Ethyl (B) 171 (H) <10 mg/dL    Comment: (NOTE) Lowest detectable limit for serum alcohol is 10 mg/dL. For medical purposes only. Performed at [facility], [address] [place], [place] [address]   Comprehensive metabolic panel     Status: Abnormal   Collection Time: 05/25/17  2:18 AM  Result Value Ref Range   Sodium 142 135 - 145 mmol/L   Potassium 3.5 3.5 - 5.1 mmol/L   Chloride 110 101 - 111 mmol/L   CO2 26 22 - 32 mmol/L   Glucose, Bld 80 65 - 99 mg/dL   BUN 9 6 - 20 mg/dL   Creatinine, Ser 0.55 0.44 - 1.00 mg/dL   Calcium 8.8 (L) 8.9 - 10.3 mg/dL   Total Protein 7.3 6.5 - 8.1 g/dL   Albumin 4.0 3.5 - 5.0 g/dL   AST 24 15 - 41 U/L   ALT 31 14 - 54 U/L   Alkaline Phosphatase 46 38 - 126 U/L   Total Bilirubin 0.6 0.3 - 1.2 mg/dL   GFR calc non Af Amer >60 >60 mL/min   GFR calc Af Amer >60 >60 mL/min    Comment: (NOTE) The eGFR has been calculated using the CKD EPI equation. This calculation has not been validated in all clinical situations. eGFR's persistently <60 mL/min signify possible Chronic Kidney Disease.     Anion gap 6 5 - 15    Comment: Performed at [facility], [address] [place], [place] [address]  Salicylate level     Status: None   Collection Time: 05/25/17  2:18 AM  Result Value Ref Range   Salicylate Lvl <4.5 2.8 - 30.0 mg/dL    Comment: Performed at [facility], [address] [place],  [address]  CBC with Differential     Status: None   Collection Time: 05/25/17  2:18 AM  Result Value Ref Range   WBC 8.4 3.6 - 11.0 K/uL   RBC 4.27 3.80 - 5.20 MIL/uL   Hemoglobin  14.0 12.0 - 16.0 g/dL   HCT 40.5 35.0 - 47.0 %   MCV 94.9 80.0 - 100.0 fL   MCH 32.8 26.0 - 34.0 pg   MCHC 34.5 32.0 - 36.0 g/dL   RDW 13.1 11.5 - 14.5 %   Platelets 241 150 - 440 K/uL   Neutrophils Relative % 58 %   Neutro Abs 4.9 1.4 - 6.5 K/uL   Lymphocytes Relative 33 %   Lymphs Abs 2.8 1.0 - 3.6 K/uL   Monocytes Relative 7 %   Monocytes Absolute 0.6 0.2 - 0.9 K/uL   Eosinophils Relative 1 %   Eosinophils Absolute 0.1 0 - 0.7 K/uL   Basophils Relative 1 %   Basophils Absolute 0.1 0 - 0.1 K/uL    Comment: Performed at [facility], [address] [place], [place] [address]  CK     Status: None   Collection Time: 05/25/17  2:18 AM  Result Value Ref Range   Total CK 55 38 - 234 U/L    Comment: Performed at [facility], [address] [place], [place] [address]    No current facility-administered medications for this encounter.    Current Outpatient Medications  Medication Sig Dispense Refill  . acyclovir (ZOVIRAX) 400 MG tablet Take 1 tablet by mouth as needed.  1  . amphetamine-dextroamphetamine (ADDERALL) 20 MG tablet Take 1 tablet (20 mg total) by mouth 2 (two) times daily. 60 tablet 0  . Aspirin-Acetaminophen-Caffeine (TGT MIGRAINE RELIEF PO) Take 2 tablets by mouth daily.    . clonazePAM (KLONOPIN) 0.5 MG tablet Take 1 tablet (0.5 mg total) by mouth 2 (two) times daily as needed for anxiety. 60 tablet 2  . loratadine (CLARITIN) 10 MG tablet Take  10 mg by mouth daily.    . Magnesium 300 MG CAPS Take by mouth.    . Omega-3 Fatty Acids (FISH OIL) 1000 MG CPDR Take by mouth.    Marland Kitchen OVER THE COUNTER MEDICATION     . SUMAtriptan (IMITREX) 100 MG tablet Take 1 tablet by mouth as needed.  11  . UNABLE TO FIND Place 1 application under the tongue at bedtime. Med Name: CBD oil with Frankincense     . vitamin B-12 (CYANOCOBALAMIN) 1000 MCG tablet Take 1,000 mcg by mouth daily.      Musculoskeletal: Strength & Muscle Tone: within normal limits Gait & Station: normal Patient leans: N/A  Psychiatric Specialty Exam: Physical Exam  Nursing note and vitals reviewed. Constitutional: She appears well-developed and well-nourished.  HENT:  Head: Normocephalic and atraumatic.  Eyes: Pupils are equal, round, and reactive to light. Conjunctivae are normal.  Neck: Normal range of motion.  Cardiovascular: Regular rhythm and normal heart sounds.  Respiratory: Effort normal. No respiratory distress.  GI: Soft.  Musculoskeletal: Normal range of motion.  Neurological: She is alert.  Skin: Skin is warm and dry.  Psychiatric: Her affect is blunt. Her speech is delayed. She is slowed and withdrawn. Thought content is not paranoid. She expresses impulsivity. She expresses no homicidal and no suicidal ideation. She exhibits abnormal recent memory.    Review of Systems  Constitutional: Positive for malaise/fatigue.  HENT: Negative.   Eyes: Negative.   Respiratory: Negative.   Cardiovascular: Negative.   Gastrointestinal: Positive for abdominal pain, constipation and nausea.  Musculoskeletal: Negative.   Skin: Negative.   Neurological: Negative.   Psychiatric/Behavioral: Positive for depression, memory loss and substance abuse. Negative for hallucinations and suicidal ideas. The patient is nervous/anxious and has  insomnia.     Blood pressure 98/60, pulse 61, temperature 98.3 F (36.8 C), temperature source Oral, resp. rate 20, height _0  (1.651 m),  weight 57.2 kg (126 lb), last menstrual period 05/07/2017, SpO2 100 %.Body mass index is 20.97 kg/m.  General Appearance: Casual  Eye Contact:  Minimal  Speech:  Slow  Volume:  Decreased  Mood:  Dysphoric  Affect:  Congruent  Thought Process:  Coherent  Orientation:  Full (Time, Place, and Person)  Thought Content:  Rumination and Tangential  Suicidal Thoughts:  No  Homicidal Thoughts:  No  Memory:  Immediate;   Fair Recent;   Fair Remote;   Poor  Judgement:  Impaired  Insight:  Shallow  Psychomotor Activity:  Decreased  Concentration:  Concentration: Fair  Recall:  [employer] of Knowledge:  Fair  Language:  Fair  Akathisia:  No  Handed:  Right  AIMS (if indicated):     Assets:  Desire for Improvement Housing Resilience  ADL's:  Intact  Cognition:  Impaired,  Mild  Sleep:        Treatment Plan Summary: Plan 42 year old woman with multiple somatic and psychological problems.  She did take an excessive number of Xanax while drinking last night but there is no evidence that she actually threatened to kill herself.  Patient has insisted here in the hospital that she is not suicidal and it does appear that she called people after taking the pills.  Patient has chronic mood and anxiety symptoms all of which she tends to relate to her somatic complaints.  Right now she is not psychotic not agitated completely denying suicidal thoughts.  Not likely to benefit from hospitalization and no longer committable.  Patient counseled about the dangers of combining alcohol and benzodiazepines.  Strongly encouraged to stop drinking and stop all substance abuse.  Encouraged her to get back in touch with her counselor as soon as possible.  Did some supportive reassurance about how her anxieties about her medical problems are probably a little excessive but encouraged her to follow up with her doctor appointments.  Case reviewed with emergency room physician.  Discontinued IV C.  Disposition: No  evidence of imminent risk to self or others at present.   Patient does not meet criteria for psychiatric inpatient admission. Supportive therapy provided about ongoing stressors. Discussed crisis plan, support from social network, calling 911, coming to the Emergency Department, and calling Suicide Hotline.  Alethia Berthold, MD 05/25/2017 4:07 PM

## 2017-05-25 NOTE — Discharge Instructions (Signed)

## 2017-05-25 NOTE — ED Notes (Signed)
Brother visiting with pt with pt access monitoring.

## 2017-05-25 NOTE — ED Notes (Signed)
Resumed care from [place], [place]. Pt resting comfortably with even respirations. NAD noted. Will continue to monitor.

## 2017-05-25 NOTE — ED Provider Notes (Signed)
-----------------------------------------   3:26 PM on 05/25/2017 -----------------------------------------   BP 98/60   Pulse 61   Temp 98.3 F (36.8 C) (Oral)   Resp 20   Ht 1.651 m ( )   Wt 57.2 kg (126 lb)   LMP 05/07/2017 (Exact Date)   SpO2 100%   BMI 20.97 kg/m   Dr. Toni Amend evaluated the patient and feels that the patient is safe to be discharged with outpatient follow-up.  The patient is hemodynamically stable and appropriate to go at this time.    Loleta Rose, MD 05/25/17 [phone]

## 2017-05-25 NOTE — ED Notes (Signed)
Father is on his way to pick up pt

## 2017-05-25 NOTE — ED Triage Notes (Addendum)
Pt arrived via EMS from home with complaints of drug overdose. Pt was home when she called over some friends. Friends noticed that she was "groggy", pt told friends she took 72 Zanax. VS per EMS BP-100/60 HR-70s  O2sat-98%RA. Pt has Hx of depression. PT is alert and oriented x 2 (person and why she is here) PT states that she was not trying to kill herself today.

## 2017-05-25 NOTE — ED Notes (Signed)
Pt discharged home with parents after verbalizing understanding of discharge instructions; nad noted.

## 2017-05-27 ENCOUNTER — Telehealth: Payer: Self-pay

## 2017-05-27 ENCOUNTER — Ambulatory Visit
Admission: RE | Admit: 2017-05-27 | Discharge: 2017-05-27 | Disposition: A | Discharge status: Home / Self Care | Payer: BLUE CROSS/BLUE SHIELD | Source: Ambulatory Visit | Attending: Physician Assistant | Admitting: Physician Assistant

## 2017-05-27 DIAGNOSIS — R17 Unspecified jaundice: Secondary | ICD-10-CM | POA: Diagnosis present

## 2017-05-27 DIAGNOSIS — R7401 Elevation of levels of liver transaminase levels: Secondary | ICD-10-CM

## 2017-05-27 DIAGNOSIS — R740 Nonspecific elevation of levels of transaminase and lactic acid dehydrogenase [LDH]: Secondary | ICD-10-CM | POA: Insufficient documentation

## 2017-05-27 DIAGNOSIS — R140 Abdominal distension (gaseous): Secondary | ICD-10-CM

## 2017-05-27 NOTE — Telephone Encounter (Signed)
Viewed by Ozella Rocks on 05/27/2017 10:46 AM

## 2017-05-27 NOTE — Telephone Encounter (Signed)
-----   Message from Margaretann Loveless, [place] sent at 05/27/2017 10:11 AM EDT ----- Liver US and gallbladder are normal. Call if still having symptoms.

## 2017-05-28 LAB — CBC WITH DIFFERENTIAL/PLATELET
BASOS ABS: 0 10*3/uL (ref 0.0–0.2)
Basos: 1 %
EOS (ABSOLUTE): 0 10*3/uL (ref 0.0–0.4)
EOS: 1 %
HEMATOCRIT: 38.1 % (ref 34.0–46.6)
Hemoglobin: 12.9 g/dL (ref 11.1–15.9)
IMMATURE GRANULOCYTES: 0 %
Immature Grans (Abs): 0 10*3/uL (ref 0.0–0.1)
Lymphocytes Absolute: 2.3 10*3/uL (ref 0.7–3.1)
Lymphs: 32 %
MCH: 31.9 pg (ref 26.6–33.0)
MCHC: 33.9 g/dL (ref 31.5–35.7)
MCV: 94 fL (ref 79–97)
MONOCYTES: 5 %
MONOS ABS: 0.4 10*3/uL (ref 0.1–0.9)
NEUTROS PCT: 61 %
Neutrophils Absolute: 4.3 10*3/uL (ref 1.4–7.0)
Platelets: 290 10*3/uL (ref 150–379)
RBC: 4.05 x10E6/uL (ref 3.77–5.28)
RDW: 12.8 % (ref 12.3–15.4)
WBC: 7 10*3/uL (ref 3.4–10.8)

## 2017-05-28 LAB — ALLERGEN PROFILE, BASIC FOOD
Allergen Corn, IgE: <0.1 kU/L
Beef IgE: <0.1 kU/L
Chocolate/Cacao IgE: <0.1 kU/L
FOOD MIX (SEAFOODS) IGE: NEGATIVE
Peanut IgE: <0.1 kU/L
Soybean IgE: <0.1 kU/L

## 2017-05-28 LAB — HEPATIC FUNCTION PANEL
ALT: 41 IU/L — AB (ref 0–32)
AST: 32 IU/L (ref 0–40)
Albumin: 4.3 g/dL (ref 3.5–5.5)
Alkaline Phosphatase: 53 IU/L (ref 39–117)
Bilirubin Total: 0.8 mg/dL (ref 0.0–1.2)
Bilirubin, Direct: 0.2 mg/dL (ref 0.00–0.40)
Total Protein: 6.8 g/dL (ref 6.0–8.5)

## 2017-05-31 ENCOUNTER — Telehealth: Payer: Self-pay

## 2017-05-31 NOTE — Telephone Encounter (Signed)
Viewed by Ozella Rocks on 05/31/2017 9:11 AM

## 2017-05-31 NOTE — Telephone Encounter (Signed)
-----   Message from Margaretann Loveless, [place] sent at 05/31/2017  9:08 AM EDT ----- Food allergy panel is negative.

## 2017-06-27 ENCOUNTER — Other Ambulatory Visit: Payer: Self-pay | Admitting: Sports Medicine

## 2017-06-27 DIAGNOSIS — S8992XA Unspecified injury of left lower leg, initial encounter: Secondary | ICD-10-CM

## 2017-06-27 DIAGNOSIS — M25562 Pain in left knee: Secondary | ICD-10-CM

## 2017-07-11 ENCOUNTER — Ambulatory Visit
Admission: RE | Admit: 2017-07-11 | Discharge: 2017-07-11 | Disposition: A | Discharge status: Home / Self Care | Payer: BLUE CROSS/BLUE SHIELD | Source: Ambulatory Visit | Attending: Sports Medicine | Admitting: Sports Medicine

## 2017-07-11 DIAGNOSIS — S83242A Other tear of medial meniscus, current injury, left knee, initial encounter: Secondary | ICD-10-CM | POA: Insufficient documentation

## 2017-07-11 DIAGNOSIS — X58XXXA Exposure to other specified factors, initial encounter: Secondary | ICD-10-CM | POA: Diagnosis not present

## 2017-07-11 DIAGNOSIS — M949 Disorder of cartilage, unspecified: Secondary | ICD-10-CM | POA: Diagnosis not present

## 2017-07-11 DIAGNOSIS — M25562 Pain in left knee: Secondary | ICD-10-CM | POA: Diagnosis present

## 2017-07-11 DIAGNOSIS — S8992XA Unspecified injury of left lower leg, initial encounter: Secondary | ICD-10-CM

## 2017-07-18 ENCOUNTER — Encounter: Payer: Self-pay | Admitting: *Deleted

## 2017-07-18 ENCOUNTER — Other Ambulatory Visit: Payer: Self-pay

## 2017-07-19 ENCOUNTER — Ambulatory Visit
Admission: RE | Admit: 2017-07-19 | Discharge: 2017-07-19 | Disposition: A | Discharge status: Home / Self Care | Payer: BLUE CROSS/BLUE SHIELD | Source: Ambulatory Visit | Attending: Orthopedic Surgery | Admitting: Orthopedic Surgery

## 2017-07-19 ENCOUNTER — Encounter
Admission: RE | Disposition: A | Discharge status: Home / Self Care | Payer: Self-pay | Source: Ambulatory Visit | Attending: Orthopedic Surgery

## 2017-07-19 ENCOUNTER — Ambulatory Visit: Payer: BLUE CROSS/BLUE SHIELD | Admitting: Anesthesiology

## 2017-07-19 DIAGNOSIS — K2970 Gastritis, unspecified, without bleeding: Secondary | ICD-10-CM | POA: Diagnosis not present

## 2017-07-19 DIAGNOSIS — Y93K1 Activity, walking an animal: Secondary | ICD-10-CM | POA: Diagnosis not present

## 2017-07-19 DIAGNOSIS — M797 Fibromyalgia: Secondary | ICD-10-CM | POA: Insufficient documentation

## 2017-07-19 DIAGNOSIS — Z8262 Family history of osteoporosis: Secondary | ICD-10-CM | POA: Diagnosis not present

## 2017-07-19 DIAGNOSIS — Z888 Allergy status to other drugs, medicaments and biological substances status: Secondary | ICD-10-CM | POA: Diagnosis not present

## 2017-07-19 DIAGNOSIS — M228X2 Other disorders of patella, left knee: Secondary | ICD-10-CM | POA: Diagnosis not present

## 2017-07-19 DIAGNOSIS — J309 Allergic rhinitis, unspecified: Secondary | ICD-10-CM | POA: Insufficient documentation

## 2017-07-19 DIAGNOSIS — Y9289 Other specified places as the place of occurrence of the external cause: Secondary | ICD-10-CM | POA: Insufficient documentation

## 2017-07-19 DIAGNOSIS — Z809 Family history of malignant neoplasm, unspecified: Secondary | ICD-10-CM | POA: Diagnosis not present

## 2017-07-19 DIAGNOSIS — G43909 Migraine, unspecified, not intractable, without status migrainosus: Secondary | ICD-10-CM | POA: Diagnosis not present

## 2017-07-19 DIAGNOSIS — F419 Anxiety disorder, unspecified: Secondary | ICD-10-CM | POA: Diagnosis not present

## 2017-07-19 DIAGNOSIS — Z8261 Family history of arthritis: Secondary | ICD-10-CM | POA: Insufficient documentation

## 2017-07-19 DIAGNOSIS — K219 Gastro-esophageal reflux disease without esophagitis: Secondary | ICD-10-CM | POA: Insufficient documentation

## 2017-07-19 DIAGNOSIS — Z79899 Other long term (current) drug therapy: Secondary | ICD-10-CM | POA: Diagnosis not present

## 2017-07-19 DIAGNOSIS — Z91041 Radiographic dye allergy status: Secondary | ICD-10-CM | POA: Insufficient documentation

## 2017-07-19 DIAGNOSIS — X501XXA Overexertion from prolonged static or awkward postures, initial encounter: Secondary | ICD-10-CM | POA: Diagnosis not present

## 2017-07-19 DIAGNOSIS — F329 Major depressive disorder, single episode, unspecified: Secondary | ICD-10-CM | POA: Insufficient documentation

## 2017-07-19 DIAGNOSIS — S83242A Other tear of medial meniscus, current injury, left knee, initial encounter: Secondary | ICD-10-CM | POA: Diagnosis not present

## 2017-07-19 HISTORY — PX: KNEE ARTHROSCOPY WITH PATELLA RECONSTRUCTION: SHX5654

## 2017-07-19 HISTORY — DX: Migraine, unspecified, not intractable, without status migrainosus: G43.909

## 2017-07-19 HISTORY — DX: Other specified behavioral and emotional disorders with onset usually occurring in childhood and adolescence: F98.8

## 2017-07-19 SURGERY — ARTHROSCOPY, KNEE, WITH PATELLAR RECONSTRUCTION
Anesthesia: General | Laterality: Left | Wound class: Clean

## 2017-07-19 MED ORDER — IBUPROFEN 800 MG PO TABS: 800.0000 mg | ORAL_TABLET | Freq: Three times a day (TID) | ORAL | 0 refills | Status: AC

## 2017-07-19 MED ORDER — ACETAMINOPHEN 160 MG/5ML PO SOLN: 325.0000 mg | ORAL | Status: DC | PRN

## 2017-07-19 MED ORDER — FENTANYL CITRATE (PF) 100 MCG/2ML IJ SOLN: 25.0000 ug | INTRAMUSCULAR | Status: DC | PRN

## 2017-07-19 MED ORDER — ROPIVACAINE HCL 5 MG/ML IJ SOLN
INTRAMUSCULAR | Status: DC | PRN
  Administered 2017-07-19: 30 mL via PERINEURAL

## 2017-07-19 MED ORDER — ONDANSETRON 4 MG PO TBDP: 4.0000 mg | ORAL_TABLET | Freq: Three times a day (TID) | ORAL | 0 refills | Status: DC | PRN

## 2017-07-19 MED ORDER — OXYCODONE HCL 5 MG PO TABS
5.0000 mg | ORAL_TABLET | Freq: Once | ORAL | Status: AC | PRN
  Administered 2017-07-19: 5 mg via ORAL

## 2017-07-19 MED ORDER — ASPIRIN EC 325 MG PO TBEC: 325.0000 mg | DELAYED_RELEASE_TABLET | Freq: Every day | ORAL | 0 refills | Status: AC

## 2017-07-19 MED ORDER — BUPIVACAINE HCL (PF) 0.5 % IJ SOLN
INTRAMUSCULAR | Status: DC | PRN
  Administered 2017-07-19: 3.5 mL

## 2017-07-19 MED ORDER — DEXAMETHASONE SODIUM PHOSPHATE 4 MG/ML IJ SOLN
INTRAMUSCULAR | Status: DC | PRN
  Administered 2017-07-19: 4 mg via INTRAVENOUS

## 2017-07-19 MED ORDER — ACETAMINOPHEN 10 MG/ML IV SOLN
INTRAVENOUS | Status: DC | PRN
  Administered 2017-07-19: 1000 mg via INTRAVENOUS

## 2017-07-19 MED ORDER — LIDOCAINE-EPINEPHRINE 1 %-1:100000 IJ SOLN
INTRAMUSCULAR | Status: DC | PRN
  Administered 2017-07-19: 3.5 mL

## 2017-07-19 MED ORDER — OXYCODONE HCL 5 MG/5ML PO SOLN: 5.0000 mg | Freq: Once | ORAL | Status: AC | PRN

## 2017-07-19 MED ORDER — LACTATED RINGERS IV SOLN
10.0000 mL/h | INTRAVENOUS | Status: DC
  Administered 2017-07-19: 10 mL/h via INTRAVENOUS
  Administered 2017-07-19: 14:00:00 via INTRAVENOUS

## 2017-07-19 MED ORDER — MIDAZOLAM HCL 5 MG/5ML IJ SOLN
INTRAMUSCULAR | Status: DC | PRN
  Administered 2017-07-19: 2 mg via INTRAVENOUS

## 2017-07-19 MED ORDER — ACETAMINOPHEN 325 MG PO TABS: 325.0000 mg | ORAL_TABLET | ORAL | Status: DC | PRN

## 2017-07-19 MED ORDER — GLYCOPYRROLATE 0.2 MG/ML IJ SOLN
INTRAMUSCULAR | Status: DC | PRN
  Administered 2017-07-19: 0.2 mg via INTRAVENOUS

## 2017-07-19 MED ORDER — PROMETHAZINE HCL 25 MG/ML IJ SOLN: 6.2500 mg | INTRAMUSCULAR | Status: DC | PRN

## 2017-07-19 MED ORDER — ONDANSETRON HCL 4 MG/2ML IJ SOLN
INTRAMUSCULAR | Status: DC | PRN
  Administered 2017-07-19: 4 mg via INTRAVENOUS

## 2017-07-19 MED ORDER — ACETAMINOPHEN 500 MG PO TABS: 1000.0000 mg | ORAL_TABLET | Freq: Three times a day (TID) | ORAL | 2 refills | Status: DC

## 2017-07-19 MED ORDER — FENTANYL CITRATE (PF) 100 MCG/2ML IJ SOLN
INTRAMUSCULAR | Status: DC | PRN
  Administered 2017-07-19: 100 ug via INTRAVENOUS

## 2017-07-19 MED ORDER — LIDOCAINE HCL (CARDIAC) PF 100 MG/5ML IV SOSY
PREFILLED_SYRINGE | INTRAVENOUS | Status: DC | PRN
  Administered 2017-07-19: 40 mg via INTRATRACHEAL

## 2017-07-19 MED ORDER — CEFAZOLIN SODIUM-DEXTROSE 2-4 GM/100ML-% IV SOLN
2.0000 g | Freq: Once | INTRAVENOUS | Status: AC
  Administered 2017-07-19: 2 g via INTRAVENOUS

## 2017-07-19 MED ORDER — PROPOFOL 10 MG/ML IV BOLUS
INTRAVENOUS | Status: DC | PRN
  Administered 2017-07-19: 150 mg via INTRAVENOUS

## 2017-07-19 MED ORDER — HYDROCODONE-ACETAMINOPHEN 5-325 MG PO TABS: 1.0000 | ORAL_TABLET | ORAL | 0 refills | Status: DC | PRN

## 2017-07-19 SURGICAL SUPPLY — 42 items
ADAPTER IRRIG TUBE 2 SPIKE SOL (ADAPTER) ×4 IMPLANT
ADPR TBG 2 SPK PMP STRL ASCP (ADAPTER) ×2
BANDAGE ACE 6X5 VEL STRL LF (GAUZE/BANDAGES/DRESSINGS) ×3 IMPLANT
BNDG ESMARK 6X12 TAN STRL LF (GAUZE/BANDAGES/DRESSINGS) ×2 IMPLANT
BUR RADIUS 3.5 (BURR) ×2 IMPLANT
CHLORAPREP W/TINT 26ML (MISCELLANEOUS) ×3 IMPLANT
COOLER POLAR GLACIER W/PUMP (MISCELLANEOUS) ×2 IMPLANT
COVER MAYO STAND STRL (DRAPES) ×2 IMPLANT
CUFF TOURN 30 STER DUAL PORT (MISCELLANEOUS) ×1 IMPLANT
DRAPE IMP U-DRAPE 54X76 (DRAPES) ×3 IMPLANT
DRAPE SURG 17X11 SM STRL (DRAPES) ×4 IMPLANT
GAUZE SPONGE 4X4 12PLY STRL (GAUZE/BANDAGES/DRESSINGS) ×2 IMPLANT
GLOVE BIOGEL PI IND STRL 8 (GLOVE) ×2 IMPLANT
GLOVE BIOGEL PI INDICATOR 8 (GLOVE) ×2
GLOVE SURG ORTHO 8.0 STRL STRW (GLOVE) ×8 IMPLANT
GOWN STRL REUS W/ TWL LRG LVL3 (GOWN DISPOSABLE) ×2 IMPLANT
GOWN STRL REUS W/ TWL XL LVL3 (GOWN DISPOSABLE) ×1 IMPLANT
GOWN STRL REUS W/TWL LRG LVL3 (GOWN DISPOSABLE) ×2
GOWN STRL REUS W/TWL XL LVL3 (GOWN DISPOSABLE) ×2
IV LACTATED RINGER IRRG 3000ML (IV SOLUTION) ×8
IV LR IRRIG 3000ML ARTHROMATIC (IV SOLUTION) ×6 IMPLANT
KIT TURNOVER KIT A (KITS) ×2 IMPLANT
MANIFOLD NEPTUNE II (INSTRUMENTS) ×2 IMPLANT
MAT BLUE FLOOR 46X72 FLO (MISCELLANEOUS) ×2 IMPLANT
NDL HYPO 21X1.5 SAFETY (NEEDLE) ×1 IMPLANT
NDL SAFETY ECLIPSE 18X1.5 (NEEDLE) ×1 IMPLANT
NEEDLE HYPO 18GX1.5 SHARP (NEEDLE) ×2
NEEDLE HYPO 21X1.5 SAFETY (NEEDLE) ×2 IMPLANT
PACK ARTHROSCOPY KNEE (MISCELLANEOUS) ×2 IMPLANT
PAD ABD DERMACEA PRESS 5X9 (GAUZE/BANDAGES/DRESSINGS) ×3 IMPLANT
PAD WRAPON POLAR KNEE (MISCELLANEOUS) ×1 IMPLANT
PADDING CAST 6X4YD NS (MISCELLANEOUS) ×1
PADDING CAST COTTON 6X4 NS (MISCELLANEOUS) ×1 IMPLANT
SET TUBE SUCT SHAVER OUTFL 24K (TUBING) ×2 IMPLANT
SET TUBE TIP INTRA-ARTICULAR (MISCELLANEOUS) ×2 IMPLANT
SUT ETHILON 3-0 FS-10 30 BLK (SUTURE) ×2
SUTURE EHLN 3-0 FS-10 30 BLK (SUTURE) ×1 IMPLANT
SYR 50ML LL SCALE MARK (SYRINGE) ×2 IMPLANT
TOWEL OR 17X26 4PK STRL BLUE (TOWEL DISPOSABLE) ×4 IMPLANT
TUBING ARTHRO INFLOW-ONLY STRL (TUBING) ×2 IMPLANT
WAND HAND CNTRL MULTIVAC 90 (MISCELLANEOUS) ×2 IMPLANT
WRAPON POLAR PAD KNEE (MISCELLANEOUS) ×2

## 2017-07-19 NOTE — Anesthesia Procedure Notes (Deleted)
Anesthesia Procedure Note     

## 2017-07-19 NOTE — Discharge Instructions (Signed)
Arthroscopic Knee Surgery - Partial Meniscectomy °  °Post-Op Instructions °  °1. Bracing or crutches: Crutches will be provided at the time of discharge from the surgery center if you do not already have them. °  °2. Ice: You may be provided with a device (Polar Care) that allows you to ice the affected area effectively. Otherwise you can ice manually.  °  °3. Driving:  Plan on not driving for at least two weeks. Please note that you are advised NOT to drive while taking narcotic pain medications as you may be impaired and unsafe to drive. °  °4. Activity: Ankle pumps several times an hour while awake to prevent blood clots. Weight bearing: as tolerated. Use crutches for as needed (usually ~1 week or less) until pain allows you to ambulate without a limp. Bending and straightening the knee is unlimited. Elevate knee above heart level as much as possible for one week. Avoid standing more than 5 minutes (consecutively) for the first week.  Avoid long distance travel for 2 weeks. ° °5. Medications:  °- You have been provided a prescription for narcotic pain medicine. After surgery, take 1-2 narcotic tablets every 4 hours if needed for severe pain.  °- You may take up to 3000mg/day of tylenol (acetaminophen). You can take 1000mg 3x/day. Please check your narcotic. If you have acetaminophen in your narcotic (each tablet will be 325mg), be careful not to exceed a total of 3000mg/day of acetaminophen.  °- A prescription for anti-nausea medication will be provided in case the narcotic medicine or anesthesia causes nausea - take 1 tablet every 6 hours only if nauseated.  °- Take ibuprofen 800 mg every 8 hours WITH food to reduce post-operative knee swelling. DO NOT STOP IBUPROFEN POST-OP UNTIL INSTRUCTED TO DO SO at first post-op office visit (10-14 days after surgery). However, please discontinue if you have any abdominal discomfort after taking this.  °- Take enteric coated aspirin 325 mg once daily for 2 weeks to prevent  blood clots.  ° ° °6. Bandages: The physical therapist should change the bandages at the first post-op appointment. If needed, the dressing supplies have been provided to you. °  °7. Physical Therapy: 1-2 times per week for 6 weeks. Therapy typically starts on post operative Day 3 or 4. You have been provided an order for physical therapy. The therapist will provide home exercises. °  °8. Work: May return to full work usually around 2 weeks after 1st post-operative visit. May do light duty/desk job in approximately 1-2 weeks when off of narcotics, pain is well-controlled, and swelling has decreased. Labor intensive jobs may require 4-6 weeks to return.  °  °  °9. Post-Op Appointments: °Your first post-op appointment will be with Dr. Louise Rawson in approximately 2 weeks time.  °  ° °If you find that they have not been scheduled please call the Orthopaedic Appointment front desk at [phone]. ° ° ° °General Anesthesia, Adult, Care After °These instructions provide you with information about caring for yourself after your procedure. Your health care provider may also give you more specific instructions. Your treatment has been planned according to current medical practices, but problems sometimes occur. Call your health care provider if you have any problems or questions after your procedure. °What can I expect after the procedure? °After the procedure, it is common to have: °· Vomiting. °· A sore throat. °· Mental slowness. ° °It is common to feel: °· Nauseous. °· Cold or shivery. °· Sleepy. °· Tired. °·   Sore or achy, even in parts of your body where you did not have surgery. ° °Follow these instructions at home: °For at least 24 hours after the procedure: °· Do not: °? Participate in activities where you could fall or become injured. °? Drive. °? Use heavy machinery. °? Drink alcohol. °? Take sleeping pills or medicines that cause drowsiness. °? Make important decisions or sign legal documents. °? Take care of children  on your own. °· Rest. °Eating and drinking °· If you vomit, drink water, juice, or soup when you can drink without vomiting. °· Drink enough fluid to keep your urine clear or pale yellow. °· Make sure you have little or no nausea before eating solid foods. °· Follow the diet recommended by your health care provider. °General instructions °· Have a responsible adult stay with you until you are awake and alert. °· Return to your normal activities as told by your health care provider. Ask your health care provider what activities are safe for you. °· Take over-the-counter and prescription medicines only as told by your health care provider. °· If you smoke, do not smoke without supervision. °· Keep all follow-up visits as told by your health care provider. This is important. °Contact a health care provider if: °· You continue to have nausea or vomiting at home, and medicines are not helpful. °· You cannot drink fluids or start eating again. °· You cannot urinate after 8-12 hours. °· You develop a skin rash. °· You have fever. °· You have increasing redness at the site of your procedure. °Get help right away if: °· You have difficulty breathing. °· You have chest pain. °· You have unexpected bleeding. °· You feel that you are having a life-threatening or urgent problem. °This information is not intended to replace advice given to you by your health care provider. Make sure you discuss any questions you have with your health care provider. °Document Released: 03/29/2000 Document Revised: 05/26/2015 Document Reviewed: 12/05/2014 °Elsevier Interactive Patient Education © 2018 [employer] ° °

## 2017-07-19 NOTE — Progress Notes (Signed)
Assisted Elsje Harker, ANMD with left, ultrasound guided, popliteal block. Side rails up, monitors on throughout procedure. See vital signs in flow sheet. Tolerated Procedure well.  

## 2017-07-19 NOTE — Anesthesia Postprocedure Evaluation (Signed)
Anesthesia Post Note  Patient: Krystal Marks  Procedure(s) Performed: KNEE ARTHROSCOPY PARTIAL MEDIAL MENISECTOMY PATELLA CHONDROPLASTY (Left )  Patient location during evaluation: PACU Anesthesia Type: General Level of consciousness: awake Pain management: pain level controlled Vital Signs Assessment: post-procedure vital signs reviewed and stable Respiratory status: respiratory function stable Cardiovascular status: stable Postop Assessment: no signs of nausea or vomiting Anesthetic complications: no    Jola BabinskiElsje Jorge Retz

## 2017-07-19 NOTE — Op Note (Addendum)
Operative Note   SURGERY DATE: 07/19/2017  PRE-OP DIAGNOSIS:  1. Left medial meniscus tear 2. 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 Left medial meniscus tear 2. 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 Left knee arthroscopy, partial medial meniscectomy 2. Left Chondroplasty of patellofemoral compartment  SURGEON: Rosealee AlbeeSunny H. Mehkai Gallo, MD  ANESTHESIA: Gen  ESTIMATED BLOOD LOSS:minimal  TOTAL IV FLUIDS: per anesthesia  INDICATION(S):  Krystal Marks is a 42 y.o. female with signs and symptoms as well as MRI finding of medial meniscus tear and small chondral defect of patella. After discussion of risks, benefits, and alternatives to surgery, the patient elected to proceed.  OPERATIVE FINDINGS:   Examination under anesthesia:A careful examination under anesthesia was performed. Passive range of motion was: Hyperextension: 2. Extension: 0. Flexion: 140. Lachman: normal. Pivot Shift: normal. Posterior drawer: normal. Varus stability in full extension: normal. Varus stability in 30 degrees of flexion: normal. Valgus stability in full extension: normal. Valgus stability in 30 degrees of flexion: normal.  Intra-operative findings:A thorough arthroscopic examination of the knee was performed. The findings are: 1. Suprapatellar pouch: Normal 2. Undersurface of median ridge: Grade 4 focal chondral defect/fissure with grade 4 changes measuring ~5x535mm 3. Medial patellar facet: Grade 2-3 degenerative changes focally with more diffuse cartilage softening 4. Lateral patellar facet: Grade 1 degenerative change with mild cartilage softening 5. Trochlea: areas of Grade 1 degenerative changes 6. Lateral gutter/popliteus tendon: Normal 7. Hoffa's fat pad: Inflamed 8. Medial gutter/plica: Normal 9. ACL: Normal 10. PCL: Normal 11. Medial meniscus: Complex tear with both vertical and horizontal components of the posterior horn. Vertical  component of tear was in white-white zone affecting ~20% meniscus width. 12. Medial compartment cartilage: Grade 1 degenerative changes 13. Lateral meniscus: Normal 14. Lateral compartment cartilage: Normal  OPERATIVE REPORT:   I identified Krystal Shanklinin the pre-operative holding area. I marked theoperativeknee with my initials. I reviewed the risks and benefits of the proposed surgical intervention and the patient (and/or patient's guardian) wished to proceed. The patient was transferred to the operative suite and placed in the supine position with all bony prominences padded. Anesthesia was administered. Appropriate IV antibioticswere administered prior to incision. The extremity was then prepped and draped in standard fashion. A time out was performed confirming the correct extremity, correct patient, and correct procedure.  Arthroscopy portals were marked. Local anesthetic was injected to the planned portal sites. The anterolateral portalwasestablished with an 11blade.   The arthroscope was placed in the anterolateral portal and theninto the suprapatellar pouch. A diagnostic knee scope was completed with the above findings. The medial meniscus tear was identified.  Next the medial portal was established under needle localization. The MCL was pie-crusted to improve visualization of the posterior horn. The meniscal tear was debrided using an arthroscopic biter and an oscillating shaver until the meniscus had stable borders. Approximately 50% of the width of the posterior horn of the meniscus remained after complete debridement of the vertical and horizontal components. A chondroplasty of the  patellofemoral compartment was performed such that there were stable cartilage edges without any loose fragments of cartilage. Arthroscopic fluid was removed from the joint.  The portals were closed with 3-0 Nylon suture. Sterile dressings included Xeroform, 4x4s, Sof-Rol, and Bias  wrap. A Polarcare was placed.  The patient was then awakened and taken to the PACU hemodynamically stable without complication.   POSTOPERATIVE PLAN: The patient will be discharged home today once they meet PACU criteria. Aspirin 325 mg daily  was prescribed for 2 weeks for DVT prophylaxis. Physical therapy will start on POD#3-4.Weight-bearing as tolerated. Follow up in 2 weeks per protocol.

## 2017-07-19 NOTE — Transfer of Care (Signed)
Immediate Anesthesia Transfer of Care Note  Patient: Krystal Marks  Procedure(s) Performed: KNEE ARTHROSCOPY PARTIAL MEDIAL MENISECTOMY PATELLA CHONDROPLASTY (Left )  Patient Location: PACU  Anesthesia Type: General  Level of Consciousness: awake, alert  and patient cooperative  Airway and Oxygen Therapy: Patient Spontanous Breathing and Patient connected to supplemental oxygen  Post-op Assessment: Post-op Vital signs reviewed, Patient's Cardiovascular Status Stable, Respiratory Function Stable, Patent Airway and No signs of Nausea or vomiting  Post-op Vital Signs: Reviewed and stable  Complications: No apparent anesthesia complications

## 2017-07-19 NOTE — Anesthesia Procedure Notes (Signed)
Anesthesia Regional Block: Adductor canal block   Pre-Anesthetic Checklist: ,, timeout performed, Correct Patient, Correct Site, Correct Laterality, Correct Procedure, Correct Position, site marked, Risks and benefits discussed,  Surgical consent,  Pre-op evaluation,  At surgeon's request and post-op pain management  Laterality: Left  Prep: chloraprep       Needles:   Needle Type: Stimiplex     Needle Length: 9cm  Needle Gauge: 21     Additional Needles:   Procedures:,,,, ultrasound used (permanent image in chart),,,,  Narrative:  Injection made incrementally with aspirations every 5 mL.  Performed by: Personally  Anesthesiologist: Jola BabinskiHarker, Tyran Huser, MD

## 2017-07-19 NOTE — H&P (Signed)
Paper H&P to be scanned into permanent record. H&P reviewed. No significant changes noted.  

## 2017-07-19 NOTE — Anesthesia Preprocedure Evaluation (Addendum)
Anesthesia Evaluation  Patient identified by MRN, date of birth, ID band Patient awake    Reviewed: Allergy & Precautions, NPO status , Patient's Chart, lab work & pertinent test results  Airway Mallampati: II  TM Distance: >3 FB     Dental   Pulmonary former smoker,    breath sounds clear to auscultation       Cardiovascular Exercise Tolerance: Good negative cardio ROS   Rhythm:Regular Rate:Normal     Neuro/Psych  Headaches (migraine), Anxiety Depression ADD   Neuromuscular disease (Fibromyalgia)    GI/Hepatic GERD  ,  Endo/Other    Renal/GU      Musculoskeletal  (+) Fibromyalgia -Meniscus tear   Abdominal   Peds  Hematology   Anesthesia Other Findings   Reproductive/Obstetrics                             Anesthesia Physical Anesthesia Plan  ASA: II  Anesthesia Plan: General   Post-op Pain Management:  Regional for Post-op pain   Induction: Intravenous  PONV Risk Score and Plan:   Airway Management Planned: LMA  Additional Equipment:   Intra-op Plan:   Post-operative Plan:   Informed Consent: I have reviewed the patients History and Physical, chart, labs and discussed the procedure including the risks, benefits and alternatives for the proposed anesthesia with the patient or authorized representative who has indicated his/her understanding and acceptance.     Plan Discussed with: CRNA  Anesthesia Plan Comments:         Anesthesia Quick Evaluation

## 2017-07-19 NOTE — Anesthesia Procedure Notes (Signed)
Procedure Name: LMA Insertion Performed by: Arlice ColtBurnett, Marny Smethers, CRNA Pre-anesthesia Checklist: Patient identified, Emergency Drugs available, Suction available, Timeout performed and Patient being monitored Patient Re-evaluated:Patient Re-evaluated prior to induction Oxygen Delivery Method: Circle system utilized Preoxygenation: Pre-oxygenation with 100% oxygen Induction Type: IV induction Ventilation: Mask ventilation without difficulty LMA: LMA inserted LMA Size: 3.0 Number of attempts: 1 Placement Confirmation: positive ETCO2 and breath sounds checked- equal and bilateral Tube secured with: Tape

## 2017-07-20 ENCOUNTER — Encounter: Payer: Self-pay | Admitting: Orthopedic Surgery

## 2017-08-09 LAB — HM MAMMOGRAPHY

## 2017-08-15 ENCOUNTER — Encounter: Payer: Self-pay | Admitting: Family Medicine

## 2017-08-16 ENCOUNTER — Ambulatory Visit: Payer: Self-pay | Admitting: Family Medicine

## 2017-09-12 ENCOUNTER — Telehealth: Payer: Self-pay | Admitting: Family Medicine

## 2017-09-14 ENCOUNTER — Encounter: Payer: BLUE CROSS/BLUE SHIELD | Admitting: Family Medicine

## 2017-09-14 NOTE — Progress Notes (Deleted)
       Patient: Krystal Marks Female    DOB: 08-12-1975   42 y.o.   MRN: [id number] Visit Date: 09/14/2017  Today's Provider: Megan Mans, MD   Chief Complaint  Patient presents with  . ADHD   Subjective:    HPI     Allergies  Allergen Reactions  . Duloxetine Nausea And Vomiting    CAN NOT TAKE WITH IMITREX   . Iodinated Diagnostic Agents Itching    Omnpaque i 300, 10 min delayed reaction of large hives, itching & urticaria// will require 13hr prep for future iv contrast,//a.calhoun  . Meperidine     Other reaction(s): Respiratory Distress  . Dilaudid [Hydromorphone]     Hard times breathing     Current Outpatient Medications:  .  acetaminophen (TYLENOL) 500 MG tablet, Take 2 tablets (1,000 mg total) by mouth every 8 (eight) hours., Disp: 90 tablet, Rfl: 2 .  acyclovir (ZOVIRAX) 400 MG tablet, Take 1 tablet by mouth as needed., Disp: , Rfl: 1 .  amphetamine-dextroamphetamine (ADDERALL) 20 MG tablet, Take 1 tablet (20 mg total) by mouth 2 (two) times daily. (Patient taking differently: Take 20 mg by mouth 2 (two) times daily. Pt takes PRN), Disp: 60 tablet, Rfl: 0 .  APPLE CIDER VINEGAR PO, Take by mouth as needed., Disp: , Rfl:  .  clonazePAM (KLONOPIN) 0.5 MG tablet, Take 1 tablet (0.5 mg total) by mouth 2 (two) times daily as needed for anxiety. (Patient not taking: Reported on 07/18/2017), Disp: 60 tablet, Rfl: 2 .  HYDROcodone-acetaminophen (NORCO) 5-325 MG tablet, Take 1-2 tablets by mouth every 4 (four) hours as needed for moderate pain or severe pain., Disp: 10 tablet, Rfl: 0 .  Magnesium 300 MG CAPS, Take by mouth., Disp: , Rfl:  .  Omega-3 Fatty Acids (FISH OIL) 1000 MG CPDR, Take by mouth., Disp: , Rfl:  .  ondansetron (ZOFRAN ODT) 4 MG disintegrating tablet, Take 1 tablet (4 mg total) by mouth every 8 (eight) hours as needed for nausea or vomiting., Disp: 20 tablet, Rfl: 0 .  Probiotic Product (PROBIOTIC ADVANCED) CAPS, Take by mouth., Disp: , Rfl:  .   SUMAtriptan (IMITREX) 100 MG tablet, Take 1 tablet by mouth as needed., Disp: , Rfl: 11 .  UNABLE TO FIND, Place 1 application under the tongue at bedtime. Med Name: CBD oil with Frankincense , Disp: , Rfl:  .  vitamin B-12 (CYANOCOBALAMIN) 1000 MCG tablet, Take 1,000 mcg by mouth daily., Disp: , Rfl:   Review of Systems  Social History   Tobacco Use  . Smoking status: Former Smoker    Packs/day: 0.50    Years: 20.00    Pack years: 10.00    Types: Cigarettes    Last attempt to quit: 06/03/2017    Years since quitting: 0.2  . Smokeless tobacco: Never Used  . Tobacco comment:    Substance Use Topics  . Alcohol use: Yes    Alcohol/week: 4.0 standard drinks    Types: 4 Glasses of wine per week   Objective:   There were no vitals taken for this visit. There were no vitals filed for this visit.   Physical Exam      Assessment & Plan:           Megan Mans, MD  [facility]

## 2017-09-15 ENCOUNTER — Ambulatory Visit: Payer: BLUE CROSS/BLUE SHIELD | Admitting: Family Medicine

## 2017-09-20 ENCOUNTER — Telehealth: Payer: Self-pay | Admitting: Family Medicine

## 2017-09-20 ENCOUNTER — Ambulatory Visit (INDEPENDENT_AMBULATORY_CARE_PROVIDER_SITE_OTHER): Payer: BLUE CROSS/BLUE SHIELD | Admitting: Family Medicine

## 2017-09-20 ENCOUNTER — Encounter: Payer: Self-pay | Admitting: Family Medicine

## 2017-09-20 VITALS — BP 112/72 | Temp 98.0°F | Resp 16 | Ht 66.0 in | Wt 138.0 lb

## 2017-09-20 DIAGNOSIS — R5383 Other fatigue: Secondary | ICD-10-CM | POA: Diagnosis not present

## 2017-09-20 DIAGNOSIS — F329 Major depressive disorder, single episode, unspecified: Secondary | ICD-10-CM

## 2017-09-20 DIAGNOSIS — F908 Attention-deficit hyperactivity disorder, other type: Secondary | ICD-10-CM

## 2017-09-20 DIAGNOSIS — I498 Other specified cardiac arrhythmias: Secondary | ICD-10-CM

## 2017-09-20 DIAGNOSIS — F32A Depression, unspecified: Secondary | ICD-10-CM

## 2017-09-20 DIAGNOSIS — F411 Generalized anxiety disorder: Secondary | ICD-10-CM

## 2017-09-20 DIAGNOSIS — Z720 Tobacco use: Secondary | ICD-10-CM

## 2017-09-20 MED ORDER — AMPHETAMINE-DEXTROAMPHETAMINE 20 MG PO TABS: 20.0000 mg | ORAL_TABLET | Freq: Two times a day (BID) | ORAL | 0 refills | Status: DC

## 2017-09-20 MED ORDER — BUPROPION HCL ER (XL) 150 MG PO TB24: 150.0000 mg | ORAL_TABLET | Freq: Every day | ORAL | 5 refills | Status: DC

## 2017-09-20 MED ORDER — AMPHETAMINE-DEXTROAMPHETAMINE 20 MG PO TABS
20.0000 mg | ORAL_TABLET | Freq: Two times a day (BID) | ORAL | 0 refills | Status: DC
Start: 2017-09-20 — End: 2017-09-20

## 2017-09-20 NOTE — Telephone Encounter (Signed)
Printed

## 2017-09-20 NOTE — Telephone Encounter (Signed)
Pt called saying [facility] in [place] told her the Adderall is on back order.  She wants to know if Dr. Sullivan LoneGilbert will give her a hard copy of the rx so she can take it somewhere and get it filled  CB#  [phone]   thanks teri

## 2017-09-20 NOTE — Telephone Encounter (Signed)
Pt was in this morning and seen Dr. Sullivan LoneGilbert.  She went to the pharmacy to pick up her meds.  They had the Wellbutrin but did not get an order for the Adderal  She uses [facility] [place]  CB#  [phone]  Thanks Barth Kirksteri

## 2017-09-20 NOTE — Progress Notes (Signed)
Patient: Krystal Marks Female    DOB: [birth year]   42 y.o.   MRN: [id number] Visit Date: 09/20/2017  Today's Provider: Megan Mansichard Gilbert Jr, MD   Chief Complaint  Patient presents with  . ADHD   Subjective:    HPI The patient is a 42 year old female who presents for follow up of her ADHD and get refill on her Adderall. Patient is currently taking Adderall 20mg  as needed.   Patient also mentions that she has chronic fatigue , and she feels that her symptoms are getting worse. She is not sure if her symptoms are secondary to her allergies. She reports that she is now getting allergy shots weekly.  She reports that she has had occasional "heart flutters" intermittently. She reports that she is now experiencing heart flutters at least 11-12 a day. She denies shortness of breath, nausea, or dizziness.      Allergies  Allergen Reactions  . Duloxetine Nausea And Vomiting    CAN NOT TAKE WITH IMITREX   . Iodinated Diagnostic Agents Itching    Omnpaque i 300, 10 min delayed reaction of large hives, itching & urticaria// will require 13hr prep for future iv contrast,//a.calhoun  . Meperidine     Other reaction(s): Respiratory Distress  . Dilaudid [Hydromorphone]     Hard times breathing     Current Outpatient Medications:  .  acetaminophen (TYLENOL) 500 MG tablet, Take 2 tablets (1,000 mg total) by mouth every 8 (eight) hours., Disp: 90 tablet, Rfl: 2 .  acyclovir (ZOVIRAX) 400 MG tablet, Take 1 tablet by mouth as needed., Disp: , Rfl: 1 .  amphetamine-dextroamphetamine (ADDERALL) 20 MG tablet, Take 1 tablet (20 mg total) by mouth 2 (two) times daily. (Patient taking differently: Take 20 mg by mouth 2 (two) times daily. Pt takes PRN), Disp: 60 tablet, Rfl: 0 .  APPLE CIDER VINEGAR PO, Take by mouth as needed., Disp: , Rfl:  .  HYDROcodone-acetaminophen (NORCO) 5-325 MG tablet, Take 1-2 tablets by mouth every 4 (four) hours as needed for moderate pain or severe pain., Disp: 10  tablet, Rfl: 0 .  ketotifen (ZADITOR) 0.025 % ophthalmic solution, Place 1 drop into both eyes 2 (two) times daily., Disp: , Rfl:  .  levocetirizine (XYZAL) 5 MG tablet, Take 5 mg by mouth every evening., Disp: , Rfl:  .  Magnesium 300 MG CAPS, Take by mouth., Disp: , Rfl:  .  meloxicam (MOBIC) 15 MG tablet, Take 15 mg by mouth daily., Disp: , Rfl:  .  Omega-3 Fatty Acids (FISH OIL) 1000 MG CPDR, Take by mouth., Disp: , Rfl:  .  omeprazole (PRILOSEC) 20 MG capsule, Take 20 mg by mouth daily., Disp: , Rfl:  .  ondansetron (ZOFRAN ODT) 4 MG disintegrating tablet, Take 1 tablet (4 mg total) by mouth every 8 (eight) hours as needed for nausea or vomiting., Disp: 20 tablet, Rfl: 0 .  Probiotic Product (PROBIOTIC ADVANCED) CAPS, Take by mouth., Disp: , Rfl:  .  SUMAtriptan (IMITREX) 100 MG tablet, Take 1 tablet by mouth as needed., Disp: , Rfl: 11 .  UNABLE TO FIND, Place 1 application under the tongue at bedtime. Med Name: CBD oil with Frankincense , Disp: , Rfl:  .  vitamin B-12 (CYANOCOBALAMIN) 1000 MCG tablet, Take 1,000 mcg by mouth daily., Disp: , Rfl:  .  clonazePAM (KLONOPIN) 0.5 MG tablet, Take 1 tablet (0.5 mg total) by mouth 2 (two) times daily as needed for anxiety. (Patient not  taking: Reported on 07/18/2017), Disp: 60 tablet, Rfl: 2  Review of Systems  Constitutional: Positive for activity change and fatigue.  Respiratory: Negative for cough and shortness of breath.   Cardiovascular: Positive for palpitations and leg swelling. Negative for chest pain.  Musculoskeletal: Negative for arthralgias.  Neurological: Negative for dizziness.  Psychiatric/Behavioral: Negative.     Social History   Tobacco Use  . Smoking status: Former Smoker    Packs/day: 0.50    Years: 20.00    Pack years: 10.00    Types: Cigarettes    Last attempt to quit: 06/03/2017    Years since quitting: 0.2  . Smokeless tobacco: Never Used  . Tobacco comment:    Substance Use Topics  . Alcohol use: Yes     Alcohol/week: 4.0 standard drinks    Types: 4 Glasses of wine per week   Objective:   BP 112/72 (BP Location: Right Arm, Patient Position: Sitting, Cuff Size: Normal)   Temp 98 F (36.7 C)   Resp 16   Ht 5\' 6"  (1.676 m)   Wt 138 lb (62.6 kg)   BMI 22.27 kg/m  Vitals:   09/20/17 1126  BP: 112/72  Resp: 16  Temp: 98 F (36.7 C)  Weight: 138 lb (62.6 kg)  Height: 5\' 6"  (1.676 m)     Physical Exam  Constitutional: She is oriented to person, place, and time. She appears well-developed and well-nourished.  HENT:  Head: Normocephalic and atraumatic.  Eyes: Conjunctivae are normal. No scleral icterus.  Neck: No thyromegaly present.  Cardiovascular: Normal rate, regular rhythm and normal heart sounds.  Pulmonary/Chest: Effort normal.  Abdominal: Soft.  Musculoskeletal: She exhibits no edema.  Lymphadenopathy:    She has no cervical adenopathy.  Neurological: She is alert and oriented to person, place, and time.  Skin: Skin is warm and dry.  Psychiatric: She has a normal mood and affect. Her behavior is normal. Judgment and thought content normal.        Assessment & Plan:     1. Periodic heart flutter  - EKG 12-Lead - Ambulatory referral to Cardiology  2. Attention deficit hyperactivity disorder (ADHD), other type   3. Fatigue, unspecified type  - Ambulatory referral to Cardiology  4. Anxiety, generalized Chronic issue. - buPROPion (WELLBUTRIN XL) 150 MG 24 hr tablet; Take 1 tablet (150 mg total) by mouth daily.  Dispense: 30 tablet; Refill: 5  5. Depression, unspecified depression type Pt now getting counseling. - buPROPion (WELLBUTRIN XL) 150 MG 24 hr tablet; Take 1 tablet (150 mg total) by mouth daily.  Dispense: 30 tablet; Refill: 5  6. Tobacco use  - buPROPion (WELLBUTRIN XL) 150 MG 24 hr tablet; Take 1 tablet (150 mg total) by mouth daily.  Dispense: 30 tablet; Refill: 5     I have done the exam and reviewed the chart and it is accurate to the best of  my knowledge. Dentist has been used and  any errors in dictation or transcription are unintentional. Julieanne Manson M.D. [facility]   Megan Mans, MD  [facility]

## 2017-09-21 ENCOUNTER — Ambulatory Visit (INDEPENDENT_AMBULATORY_CARE_PROVIDER_SITE_OTHER): Payer: BLUE CROSS/BLUE SHIELD

## 2017-09-21 DIAGNOSIS — Z23 Encounter for immunization: Secondary | ICD-10-CM | POA: Diagnosis not present

## 2017-10-12 ENCOUNTER — Other Ambulatory Visit: Payer: Self-pay | Admitting: Family Medicine

## 2017-10-12 DIAGNOSIS — F329 Major depressive disorder, single episode, unspecified: Secondary | ICD-10-CM

## 2017-10-12 DIAGNOSIS — F32A Depression, unspecified: Secondary | ICD-10-CM

## 2017-10-12 DIAGNOSIS — F411 Generalized anxiety disorder: Secondary | ICD-10-CM

## 2017-10-12 DIAGNOSIS — Z720 Tobacco use: Secondary | ICD-10-CM

## 2017-10-18 NOTE — Telephone Encounter (Signed)
Please review. Thanks!  

## 2017-11-21 ENCOUNTER — Ambulatory Visit (INDEPENDENT_AMBULATORY_CARE_PROVIDER_SITE_OTHER): Payer: BLUE CROSS/BLUE SHIELD | Admitting: Family Medicine

## 2017-11-21 VITALS — BP 122/60 | HR 72 | Temp 98.1°F | Resp 16 | Wt 144.0 lb

## 2017-11-21 DIAGNOSIS — G43919 Migraine, unspecified, intractable, without status migrainosus: Secondary | ICD-10-CM

## 2017-11-21 DIAGNOSIS — F902 Attention-deficit hyperactivity disorder, combined type: Secondary | ICD-10-CM

## 2017-11-21 DIAGNOSIS — F321 Major depressive disorder, single episode, moderate: Secondary | ICD-10-CM | POA: Diagnosis not present

## 2017-11-21 DIAGNOSIS — K279 Peptic ulcer, site unspecified, unspecified as acute or chronic, without hemorrhage or perforation: Secondary | ICD-10-CM

## 2017-11-21 MED ORDER — SUMATRIPTAN SUCCINATE 100 MG PO TABS: 100.0000 mg | ORAL_TABLET | ORAL | 11 refills | Status: DC | PRN

## 2017-11-21 NOTE — Progress Notes (Signed)
Krystal Marks  MRN: [id number] DOB: December 17, 1975  Subjective:  HPI   Patient is a 42 year old female who presents for follow up.  She was last seen on 09/20/17.  On that day she was in for her routine follow up of ADHD.  However, she had complaint of fatigue and palpitations.  Her EKG was normal and she was referred to Cardiology for follow up.   She states she is feeling well and the cardiologist did not need for her to follow up with him. 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  Patient Active Problem List   Diagnosis Date Noted  . Benzodiazepine abuse (HCC) 05/25/2017  . Alcohol abuse 05/25/2017  . Benzodiazepine overdose 05/25/2017  . 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  . 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 Fatigue 01/20/2017  . Fibromyalgia 01/12/2016  . ADD (attention deficit disorder) 08/16/2014  . Cigarette smoker 08/16/2014  . Depression, major, single episode, moderate (HCC) 08/16/2014  . Anxiety, generalized 08/16/2014  . Acid reflux 08/16/2014  . Irregular bleeding 08/16/2014  . Cannot sleep 08/16/2014  . Migraine 08/16/2014  . Gastroduodenal ulcer 08/16/2014  . Anxiety 06/18/2014  . NONSPECIFIC ABNORM RESULTS OTH SPEC FUNCT STUDY 09/28/2007    Past Medical History:  Diagnosis Date  . ADD (attention deficit disorder)   . Anxiety   . Depression   . Fibromyalgia   . GERD (gastroesophageal reflux disease)   . Migraine headache    monthly    Social History   Socioeconomic History  . Marital status: Divorced    Spouse name: Not on file  . Number of children: Not on file  . Years of education: Not on file  . Highest education level: Not on file  Occupational History  . Not on file  Social Needs  . Financial resource strain: Not on file  . Food insecurity:    Worry: Not on file    Inability: Not on file  . Transportation needs:    Medical: Not on file   Non-medical: Not on file  Tobacco Use  . Smoking status: Former Smoker    Packs/day: 0.50    Years: 20.00    Pack years: 10.00    Types: Cigarettes    Last attempt to quit: 06/03/2017    Years since quitting: 0.4  . Smokeless tobacco: Never Used  . Tobacco comment:    Substance and Sexual Activity  . Alcohol use: Yes    Alcohol/week: 4.0 standard drinks    Types: 4 Glasses of wine per week  . Drug use: No  . Sexual activity: Not on file  Lifestyle  . Physical activity:    Days per week: Not on file    Minutes per session: Not on file  . Stress: Not on file  Relationships  . Social connections:    Talks on phone: Not on file    Gets together: Not on file    Attends religious service: Not on file    Active member of club or organization: Not on file    Attends meetings of clubs or organizations: Not on file    Relationship status: Not on file  . Intimate partner violence:    Fear of current or ex partner: Not on file    Emotionally abused: Not on file    Physically abused: Not on file    Forced sexual activity: Not on file  Other Topics Concern  .  Not on file  Social History Narrative  . Not on file    Outpatient Encounter Medications as of 11/21/2017  Medication Sig  . amphetamine-dextroamphetamine (ADDERALL) 20 MG tablet Take 1 tablet (20 mg total) by mouth 2 (two) times daily.  Marland Kitchen. amphetamine-dextroamphetamine (ADDERALL) 20 MG tablet Take 1 tablet (20 mg total) by mouth 2 (two) times daily.  Marland Kitchen. amphetamine-dextroamphetamine (ADDERALL) 20 MG tablet Take 1 tablet (20 mg total) by mouth 2 (two) times daily.  Marland Kitchen. buPROPion (WELLBUTRIN XL) 150 MG 24 hr tablet TAKE 1 TABLET BY MOUTH EVERY DAY  . ketotifen (ZADITOR) 0.025 % ophthalmic solution Place 1 drop into both eyes 2 (two) times daily.  . meloxicam (MOBIC) 15 MG tablet Take 15 mg by mouth daily.  Marland Kitchen. omeprazole (PRILOSEC) 20 MG capsule Take 20 mg by mouth daily.  . SUMAtriptan (IMITREX) 100 MG tablet Take 1 tablet by mouth  as needed.  Marland Kitchen. UNABLE TO FIND Place 1 application under the tongue at bedtime. Med Name: CBD oil with Frankincense   . acyclovir (ZOVIRAX) 400 MG tablet Take 1 tablet by mouth as needed.  . Omega-3 Fatty Acids (FISH OIL) 1000 MG CPDR Take by mouth.  . vitamin B-12 (CYANOCOBALAMIN) 1000 MCG tablet Take 1,000 mcg by mouth daily.  . [DISCONTINUED] acetaminophen (TYLENOL) 500 MG tablet Take 2 tablets (1,000 mg total) by mouth every 8 (eight) hours.  . [DISCONTINUED] APPLE CIDER VINEGAR PO Take by mouth as needed.  . [DISCONTINUED] clonazePAM (KLONOPIN) 0.5 MG tablet Take 1 tablet (0.5 mg total) by mouth 2 (two) times daily as needed for anxiety. (Patient not taking: Reported on 07/18/2017)  . [DISCONTINUED] HYDROcodone-acetaminophen (NORCO) 5-325 MG tablet Take 1-2 tablets by mouth every 4 (four) hours as needed for moderate pain or severe pain.  . [DISCONTINUED] levocetirizine (XYZAL) 5 MG tablet Take 5 mg by mouth every evening.  . [DISCONTINUED] Magnesium 300 MG CAPS Take by mouth.  . [DISCONTINUED] ondansetron (ZOFRAN ODT) 4 MG disintegrating tablet Take 1 tablet (4 mg total) by mouth every 8 (eight) hours as needed for nausea or vomiting.  . [DISCONTINUED] Probiotic Product (PROBIOTIC ADVANCED) CAPS Take by mouth.   No facility-administered encounter medications on file as of 11/21/2017.     Allergies  Allergen Reactions  . Duloxetine Nausea And Vomiting    CAN NOT TAKE WITH IMITREX   . Iodinated Diagnostic Agents Itching    Omnpaque i 300, 10 min delayed reaction of large hives, itching & urticaria// will require 13hr prep for future iv contrast,//a.calhoun  . Meperidine     Other reaction(s): Respiratory Distress  . Dilaudid [Hydromorphone]     Hard times breathing    Review of Systems  Constitutional: Negative for fever and malaise/fatigue.  Eyes: Negative.   Respiratory: Negative for cough and shortness of breath.   Cardiovascular: Negative for chest pain, palpitations, orthopnea,  claudication and leg swelling.  Gastrointestinal: Negative.   Skin: Negative.   Endo/Heme/Allergies: Negative.   Psychiatric/Behavioral: Negative.     Objective:  BP 122/60 (BP Location: Right Arm, Patient Position: Sitting, Cuff Size: Normal)   Pulse 72   Temp 98.1 F (36.7 C) (Oral)   Resp 16   Wt 144 lb (65.3 kg)   SpO2 99%   BMI 23.24 kg/m   Physical Exam  Constitutional: She is oriented to person, place, and time and well-developed, well-nourished, and in no distress.  HENT:  Head: Normocephalic and atraumatic.  Eyes: Conjunctivae are normal.  Neck: No thyromegaly present.  Cardiovascular:  Normal rate, regular rhythm and normal heart sounds.  Pulmonary/Chest: Effort normal and breath sounds normal.  Abdominal: Soft.  Neurological: She is alert and oriented to person, place, and time. Gait normal. GCS score is 15.  Skin: Skin is warm and dry.  Psychiatric: Mood, memory, affect and judgment normal.    Assessment and Plan :   1. Intractable migraine without status migrainosus, unspecified migraine type Refilled Imitrex for 1 year.  2. Gastroduodenal ulcer   3. Depression, major, single episode, moderate (HCC) In remission.  4. Attention deficit hyperactivity disorder (ADHD), combined type Presently stable.  HPI, Exam and A&P Transcribed under the direction and in the presence of Julieanne Manson, Montez Hageman., MD. Electronically Signed: Janey Greaser, RMA I have done the exam and reviewed the chart and it is accurate to the best of my knowledge. Dentist has been used and  any errors in dictation or transcription are unintentional. Julieanne Manson M.D. [facility]

## 2017-12-19 ENCOUNTER — Ambulatory Visit: Payer: Self-pay | Admitting: Family Medicine

## 2018-01-19 ENCOUNTER — Ambulatory Visit: Payer: Self-pay | Admitting: Family Medicine

## 2018-02-01 ENCOUNTER — Ambulatory Visit (INDEPENDENT_AMBULATORY_CARE_PROVIDER_SITE_OTHER): Payer: BLUE CROSS/BLUE SHIELD | Admitting: Family Medicine

## 2018-02-01 ENCOUNTER — Encounter: Payer: Self-pay | Admitting: Family Medicine

## 2018-02-01 VITALS — BP 122/60 | HR 68 | Temp 98.9°F | Resp 16 | Ht 66.0 in | Wt 144.0 lb

## 2018-02-01 DIAGNOSIS — M25531 Pain in right wrist: Secondary | ICD-10-CM | POA: Diagnosis not present

## 2018-02-01 DIAGNOSIS — F908 Attention-deficit hyperactivity disorder, other type: Secondary | ICD-10-CM | POA: Diagnosis not present

## 2018-02-01 DIAGNOSIS — K279 Peptic ulcer, site unspecified, unspecified as acute or chronic, without hemorrhage or perforation: Secondary | ICD-10-CM | POA: Diagnosis not present

## 2018-02-01 DIAGNOSIS — F411 Generalized anxiety disorder: Secondary | ICD-10-CM

## 2018-02-01 MED ORDER — PREDNISONE 10 MG (21) PO TBPK
ORAL_TABLET | ORAL | 0 refills | Status: DC
Start: 2018-02-01 — End: 2019-07-12

## 2018-02-01 NOTE — Progress Notes (Signed)
Patient: Krystal Marks Female    DOB: 1975-07-06   42 y.o.   MRN: [id number] Visit Date: 02/01/2018  Today's Provider: Megan Mans, MD   Chief Complaint  Patient presents with  . ADHD  . Wrist Pain    possible carpal tunnel syndome    Subjective:     HPI  Patient comes in today for a follow up. She is due for refills on Adderall. She reports good compliance, and good symptom control.  She states she only uses Adderall to work.  We have had this discussion before.  She is also pain in her right wrist that has progressed over the last month (before christmas). She reports that the pain radiates up her right elbow and the pain is constant. She reports that her grip has also decreased in her right hand.  She is worried about this as she had a maternal grandmother with rheumatoid arthritis.  Allergies  Allergen Reactions  . Duloxetine Nausea And Vomiting    CAN NOT TAKE WITH IMITREX   . Iodinated Diagnostic Agents Itching    Omnpaque i 300, 10 min delayed reaction of large hives, itching & urticaria// will require 13hr prep for future iv contrast,//a.calhoun  . Meperidine     Other reaction(s): Respiratory Distress  . Dilaudid [Hydromorphone]     Hard times breathing     Current Outpatient Medications:  .  amphetamine-dextroamphetamine (ADDERALL) 20 MG tablet, Take 1 tablet (20 mg total) by mouth 2 (two) times daily., Disp: 60 tablet, Rfl: 0 .  amphetamine-dextroamphetamine (ADDERALL) 20 MG tablet, Take 1 tablet (20 mg total) by mouth 2 (two) times daily., Disp: 60 tablet, Rfl: 0 .  amphetamine-dextroamphetamine (ADDERALL) 20 MG tablet, Take 1 tablet (20 mg total) by mouth 2 (two) times daily., Disp: 60 tablet, Rfl: 0 .  buPROPion (WELLBUTRIN XL) 150 MG 24 hr tablet, TAKE 1 TABLET BY MOUTH EVERY DAY, Disp: 90 tablet, Rfl: 2 .  ketotifen (ZADITOR) 0.025 % ophthalmic solution, Place 1 drop into both eyes 2 (two) times daily., Disp: , Rfl:  .  meloxicam (MOBIC) 15 MG  tablet, Take 15 mg by mouth daily., Disp: , Rfl:  .  Omega-3 Fatty Acids (FISH OIL) 1000 MG CPDR, Take by mouth., Disp: , Rfl:  .  omeprazole (PRILOSEC) 20 MG capsule, Take 20 mg by mouth daily., Disp: , Rfl:  .  SUMAtriptan (IMITREX) 100 MG tablet, Take 1 tablet (100 mg total) by mouth as needed., Disp: 10 tablet, Rfl: 11 .  UNABLE TO FIND, Place 1 application under the tongue at bedtime. Med Name: CBD oil with Frankincense , Disp: , Rfl:  .  vitamin B-12 (CYANOCOBALAMIN) 1000 MCG tablet, Take 1,000 mcg by mouth daily., Disp: , Rfl:  .  acyclovir (ZOVIRAX) 400 MG tablet, Take 1 tablet by mouth as needed., Disp: , Rfl: 1  Review of Systems  Constitutional: Negative for activity change.  HENT: Negative.   Eyes: Negative.   Respiratory: Negative for cough and shortness of breath.   Cardiovascular: Negative for chest pain, palpitations and leg swelling.  Gastrointestinal: Negative.   Endocrine: Negative.   Musculoskeletal: Positive for arthralgias.  Skin: Negative.   Allergic/Immunologic: Negative.   Psychiatric/Behavioral: Negative for agitation, decreased concentration, self-injury, sleep disturbance and suicidal ideas. The patient is not nervous/anxious.     Social History   Tobacco Use  . Smoking status: Former Smoker    Packs/day: 0.50    Years: 20.00    Pack  years: 10.00    Types: Cigarettes    Last attempt to quit: 06/03/2017    Years since quitting: 0.6  . Smokeless tobacco: Never Used  . Tobacco comment:    Substance Use Topics  . Alcohol use: Yes    Alcohol/week: 4.0 standard drinks    Types: 4 Glasses of wine per week      Objective:   BP 122/60 (BP Location: Left Arm, Patient Position: Sitting, Cuff Size: Normal)   Pulse 68   Temp 98.9 F (37.2 C)   Resp 16   Ht 5\' 6"  (1.676 m)   Wt 144 lb (65.3 kg)   BMI 23.24 kg/m  Vitals:   02/01/18 1128  BP: 122/60  Pulse: 68  Resp: 16  Temp: 98.9 F (37.2 C)  Weight: 144 lb (65.3 kg)  Height: 5\' 6"  (1.676 m)      Physical Exam Constitutional:      Appearance: She is well-developed.  HENT:     Head: Normocephalic and atraumatic.  Eyes:     General: No scleral icterus.    Conjunctiva/sclera: Conjunctivae normal.  Neck:     Thyroid: No thyromegaly.  Cardiovascular:     Rate and Rhythm: Normal rate and regular rhythm.     Heart sounds: Normal heart sounds.  Pulmonary:     Effort: Pulmonary effort is normal.  Abdominal:     Palpations: Abdomen is soft.  Musculoskeletal:        General: No swelling, tenderness, deformity or signs of injury.     Comments: Negative Tinel sign.  Lymphadenopathy:     Cervical: No cervical adenopathy.  Skin:    General: Skin is warm and dry.  Neurological:     General: No focal deficit present.     Mental Status: She is alert and oriented to person, place, and time. Mental status is at baseline.  Psychiatric:        Behavior: Behavior normal.        Thought Content: Thought content normal.        Judgment: Judgment normal.         Assessment & Plan    1. Right wrist pain Patient thinks she has carpal tunnel syndrome or rheumatoid arthritis.  I do not think at this time she has either.  However I do think a cock-up wrist splint will work to wear most of the time.  Do not taper and will refer to hand surgeon if she does not improve. - predniSONE (STERAPRED UNI-PAK 21 TAB) 10 MG (21) TBPK tablet; Taper as directed.  Dispense: 21 tablet; Refill: 0  2. Attention deficit hyperactivity disorder (ADHD), other type Patient to only use Adderall for working. - amphetamine-dextroamphetamine (ADDERALL) 20 MG tablet; Take 1 tablet (20 mg total) by mouth 2 (two) times daily.  Dispense: 60 tablet; Refill: 0  3. Gastroduodenal ulcer Chronic symptomatic problem.  Follow clinically.  May need lifelong PPI  4. Anxiety, generalized Ongoing issue.  presently stable.    I have done the exam and reviewed the above chart and it is accurate to the best of my  knowledge. Dentist has been used in this note in any air is in the dictation or transcription are unintentional.  Megan Mans, MD  [facility]

## 2018-02-06 MED ORDER — AMPHETAMINE-DEXTROAMPHETAMINE 20 MG PO TABS: 20.0000 mg | ORAL_TABLET | Freq: Two times a day (BID) | ORAL | 0 refills | Status: DC

## 2018-02-07 ENCOUNTER — Other Ambulatory Visit: Payer: Self-pay | Admitting: Family Medicine

## 2018-02-07 DIAGNOSIS — F32A Depression, unspecified: Secondary | ICD-10-CM

## 2018-02-07 DIAGNOSIS — Z720 Tobacco use: Secondary | ICD-10-CM

## 2018-02-07 DIAGNOSIS — F411 Generalized anxiety disorder: Secondary | ICD-10-CM

## 2018-02-07 DIAGNOSIS — F329 Major depressive disorder, single episode, unspecified: Secondary | ICD-10-CM

## 2018-04-26 ENCOUNTER — Telehealth: Payer: Self-pay

## 2018-04-26 DIAGNOSIS — F908 Attention-deficit hyperactivity disorder, other type: Secondary | ICD-10-CM

## 2018-04-26 NOTE — Telephone Encounter (Signed)
Patient called requesting a refill on Adderall 20 mg

## 2018-05-02 ENCOUNTER — Other Ambulatory Visit: Payer: Self-pay

## 2018-05-02 DIAGNOSIS — F908 Attention-deficit hyperactivity disorder, other type: Secondary | ICD-10-CM

## 2018-05-02 NOTE — Telephone Encounter (Signed)
Patient is requesting refill for Adderall

## 2018-05-03 MED ORDER — AMPHETAMINE-DEXTROAMPHETAMINE 20 MG PO TABS: 20.0000 mg | ORAL_TABLET | Freq: Two times a day (BID) | ORAL | 0 refills | Status: DC

## 2018-05-10 ENCOUNTER — Telehealth: Payer: Self-pay | Admitting: Family Medicine

## 2018-05-10 NOTE — Telephone Encounter (Signed)
Pt called wanting to know if you think she needs to do a lyme dx test.  She has had fibromyalgia for years and she has read that the dx's are a lot alike.  CB#  [phone]  Thanks Barth Kirks

## 2018-06-01 ENCOUNTER — Other Ambulatory Visit: Payer: Self-pay | Admitting: Family Medicine

## 2018-06-01 DIAGNOSIS — F908 Attention-deficit hyperactivity disorder, other type: Secondary | ICD-10-CM

## 2018-06-01 MED ORDER — AMPHETAMINE-DEXTROAMPHETAMINE 20 MG PO TABS: 20.0000 mg | ORAL_TABLET | Freq: Two times a day (BID) | ORAL | 0 refills | Status: DC

## 2018-06-01 NOTE — Telephone Encounter (Signed)
Please review. She is due for a follow up. Sullivan Lone patient. Thanks!

## 2018-06-01 NOTE — Telephone Encounter (Signed)
Pt needs a refill on adderall 20   [facility] [address]  CB# [phone]  Thanks Barth Kirks

## 2018-06-01 NOTE — Telephone Encounter (Signed)
We will refill a 1 month supply Advised patient that she needs to schedule a follow-up visit before next refill will be given

## 2018-06-30 NOTE — Telephone Encounter (Signed)
Myalgia as diagnosis--I doubt it is covered. Happy to order lymes IGG but it is not Lymes.

## 2018-07-03 ENCOUNTER — Other Ambulatory Visit: Payer: Self-pay | Admitting: Family Medicine

## 2018-07-03 MED ORDER — AMPHETAMINE-DEXTROAMPHETAMINE 20 MG PO TABS: 20.0000 mg | ORAL_TABLET | Freq: Two times a day (BID) | ORAL | 0 refills | Status: DC

## 2018-07-03 NOTE — Telephone Encounter (Signed)
Please review. Thanks!  

## 2018-07-03 NOTE — Telephone Encounter (Signed)
Pt needs refill on her Adderall 20 mg  [facility] [address]  CB#  [phone]  Thanks Con Memos

## 2018-07-04 ENCOUNTER — Telehealth: Payer: Self-pay | Admitting: *Deleted

## 2018-07-04 NOTE — Telephone Encounter (Signed)
Patient called office stating due to insurance change her Adderall rx will require a PA. [facility] stated [facility] is [facility] but she wanted to give [place] a heads up.

## 2018-07-10 NOTE — Telephone Encounter (Signed)
PA was processed. Still waiting on approval.

## 2018-07-10 NOTE — Telephone Encounter (Signed)
Pt called back saying she has not heard anything regarding the Pa.  She is completely out of the adderall   CB#  [phone]  Thanks Con Memos

## 2018-07-11 NOTE — Telephone Encounter (Signed)
PA approved.

## 2018-08-10 ENCOUNTER — Telehealth: Payer: Self-pay | Admitting: Family Medicine

## 2018-08-10 NOTE — Telephone Encounter (Signed)
Pt contacted office for refill request on the following medications:  amphetamine-dextroamphetamine (ADDERALL) 20 MG tablet  [facility] [address]  LOV: 02/01/2018 Please advise. Thanks TNP

## 2018-08-14 ENCOUNTER — Other Ambulatory Visit: Payer: Self-pay

## 2018-08-14 MED ORDER — AMPHETAMINE-DEXTROAMPHETAMINE 20 MG PO TABS: 20.0000 mg | ORAL_TABLET | Freq: Two times a day (BID) | ORAL | 0 refills | Status: DC

## 2018-08-14 NOTE — Telephone Encounter (Signed)
Patient requesting refill. Patient reports that she called for refill on Wednesday and still has not been able to get her medications. Patient requested refill sent to Dr. Brita Romp.

## 2018-08-14 NOTE — Telephone Encounter (Signed)
My eprescribing is not working for this for some reason.  Can you see if you are able to fill it.  It is an appropriate refill.

## 2018-09-12 ENCOUNTER — Other Ambulatory Visit: Payer: Self-pay | Admitting: Family Medicine

## 2018-09-12 NOTE — Telephone Encounter (Signed)
Please review. Thanks!  

## 2018-09-12 NOTE — Telephone Encounter (Signed)
Pt needs refill on generic Adderall 20 mg  [facility] [address]  [place]

## 2018-09-13 MED ORDER — AMPHETAMINE-DEXTROAMPHETAMINE 20 MG PO TABS: 20.0000 mg | ORAL_TABLET | Freq: Two times a day (BID) | ORAL | 0 refills | Status: DC | PRN

## 2018-09-20 ENCOUNTER — Encounter: Payer: Self-pay | Admitting: Emergency Medicine

## 2018-09-20 ENCOUNTER — Other Ambulatory Visit: Payer: Self-pay

## 2018-09-20 ENCOUNTER — Emergency Department
Admission: EM | Admit: 2018-09-20 | Discharge: 2018-09-20 | Disposition: A | Discharge status: Home / Self Care | Payer: 59 | Attending: Emergency Medicine | Admitting: Emergency Medicine

## 2018-09-20 DIAGNOSIS — K625 Hemorrhage of anus and rectum: Secondary | ICD-10-CM | POA: Insufficient documentation

## 2018-09-20 DIAGNOSIS — K602 Anal fissure, unspecified: Secondary | ICD-10-CM | POA: Diagnosis not present

## 2018-09-20 DIAGNOSIS — Z87891 Personal history of nicotine dependence: Secondary | ICD-10-CM | POA: Insufficient documentation

## 2018-09-20 DIAGNOSIS — Z79899 Other long term (current) drug therapy: Secondary | ICD-10-CM | POA: Insufficient documentation

## 2018-09-20 HISTORY — DX: Irritable bowel syndrome, unspecified: K58.9

## 2018-09-20 LAB — COMPREHENSIVE METABOLIC PANEL
ALT: 15 U/L (ref 0–44)
AST: 18 U/L (ref 15–41)
Albumin: 4.4 g/dL (ref 3.5–5.0)
Alkaline Phosphatase: 53 U/L (ref 38–126)
Anion gap: 10 (ref 5–15)
BUN: 10 mg/dL (ref 6–20)
CO2: 22 mmol/L (ref 22–32)
Calcium: 9.1 mg/dL (ref 8.9–10.3)
Chloride: 103 mmol/L (ref 98–111)
Creatinine, Ser: 0.78 mg/dL (ref 0.44–1.00)
GFR calc Af Amer: >60 mL/min (ref >60)
GFR calc non Af Amer: >60 mL/min (ref >60)
Glucose, Bld: 105 mg/dL — ABNORMAL HIGH (ref 70–99)
Potassium: 3.7 mmol/L (ref 3.5–5.1)
Sodium: 135 mmol/L (ref 135–145)
Total Bilirubin: 1.5 mg/dL — ABNORMAL HIGH (ref 0.3–1.2)
Total Protein: 7.6 g/dL (ref 6.5–8.1)

## 2018-09-20 LAB — CBC
HCT: 41.5 % (ref 36.0–46.0)
Hemoglobin: 14.1 g/dL (ref 12.0–15.0)
MCH: 31.7 pg (ref 26.0–34.0)
MCHC: 34 g/dL (ref 30.0–36.0)
MCV: 93.3 fL (ref 80.0–100.0)
Platelets: 271 10*3/uL (ref 150–400)
RBC: 4.45 MIL/uL (ref 3.87–5.11)
RDW: 12.3 % (ref 11.5–15.5)
WBC: 7.5 10*3/uL (ref 4.0–10.5)
nRBC: 0 % (ref 0.0–0.2)

## 2018-09-20 LAB — TYPE AND SCREEN
ABO/RH(D): O NEG
Antibody Screen: NEGATIVE

## 2018-09-20 MED ORDER — DOCUSATE SODIUM 100 MG PO CAPS: 100.0000 mg | ORAL_CAPSULE | Freq: Two times a day (BID) | ORAL | 1 refills | Status: AC

## 2018-09-20 MED ORDER — HYDROCORTISONE ACETATE 25 MG RE SUPP: 25.0000 mg | Freq: Two times a day (BID) | RECTAL | 1 refills | Status: AC

## 2018-09-20 NOTE — ED Provider Notes (Signed)
[facility] Emergency Department Provider Note   ____________________________________________   First MD Initiated Contact with Patient 09/20/18 1851     (approximate)  I have reviewed the triage vital signs and the nursing notes.   HISTORY  Chief Complaint Rectal Bleeding   HPI Krystal Marks is a 43 y.o. female with past medical history of IBS presents to the ED complaining of rectal bleeding.  Patient reports that she has been dealing with intermittent rectal bleeding over the past couple of months, was told by her OB/GYN that she had anal fissures.  She states she goes back and forth between diarrhea and constipation due to her IBS, but more recently has been having lots of hard stools.  She was told by her PCP to start taking fiber and has started doing of this over the past couple of days.  Earlier today, she noted a fair amount of blood in the bowl when she went to have a bowel movement.  She felt a little lightheaded and fatigued afterwards, called her PCP, who told her to go to the ED for evaluation.  She currently denies any lightheadedness, weakness, chest pain, or shortness of breath.        Past Medical History:  Diagnosis Date  . ADD (attention deficit disorder)   . Anxiety   . Depression   . Fibromyalgia   . GERD (gastroesophageal reflux disease)   . IBS (irritable bowel syndrome)   . Migraine headache    monthly    Patient Active Problem List   Diagnosis Date Noted  . Benzodiazepine abuse ([place]) 05/25/2017  . Alcohol abuse 05/25/2017  . Benzodiazepine overdose 05/25/2017  . 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  . 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 Fatigue 01/20/2017  . Fibromyalgia 01/12/2016  . ADD (attention deficit disorder) 08/16/2014  . Cigarette smoker 08/16/2014  . Depression, major, single episode, moderate ([place]) 08/16/2014  . Anxiety, generalized 08/16/2014  . Acid  reflux 08/16/2014  . Irregular bleeding 08/16/2014  . Cannot sleep 08/16/2014  . Migraine 08/16/2014  . Gastroduodenal ulcer 08/16/2014  . Anxiety 06/18/2014  . NONSPECIFIC ABNORM RESULTS OTH SPEC FUNCT STUDY 09/28/2007    Past Surgical History:  Procedure Laterality Date  . BREAST ENHANCEMENT SURGERY  2007  . COLONOSCOPY    . ESOPHAGOGASTRODUODENOSCOPY     x4  . KNEE ARTHROSCOPY WITH PATELLA RECONSTRUCTION Left 07/19/2017   Procedure: KNEE ARTHROSCOPY PARTIAL MEDIAL MENISECTOMY PATELLA CHONDROPLASTY;  Surgeon: Leim Fabry, MD;  Location: [place];  Service: Orthopedics;  Laterality: Left;  . SHOULDER ARTHROSCOPY Right     Prior to Admission medications   Medication Sig Start Date End Date Taking? Authorizing Provider  acyclovir (ZOVIRAX) 400 MG tablet Take 1 tablet by mouth as needed. 03/17/16   [provider]  amphetamine-dextroamphetamine (ADDERALL) 20 MG tablet Take 1 tablet (20 mg total) by mouth 2 (two) times daily as needed. 09/13/18   Jerrol Banana., MD  buPROPion (WELLBUTRIN XL) 150 MG 24 hr tablet TAKE 1 TABLET BY MOUTH EVERY DAY 02/09/18   Jerrol Banana., MD  ketotifen (ZADITOR) 0.025 % ophthalmic solution Place 1 drop into both eyes 2 (two) times daily.    [provider]  meloxicam (MOBIC) 15 MG tablet Take 15 mg by mouth daily.    [provider]  Omega-3 Fatty Acids (FISH OIL) 1000 MG CPDR Take by mouth.    [provider]  omeprazole (PRILOSEC) 20 MG capsule  Take 20 mg by mouth daily.    [provider]  predniSONE (STERAPRED UNI-PAK 21 TAB) 10 MG (21) TBPK tablet Taper as directed. 02/01/18   Maple HudsonGilbert, Richard L Jr., MD  SUMAtriptan (IMITREX) 100 MG tablet Take 1 tablet (100 mg total) by mouth as needed. 11/21/17   Maple HudsonGilbert, Richard L Jr., MD  UNABLE TO FIND Place 1 application under the tongue at bedtime. Med Name: CBD oil with Frankincense     [provider]  vitamin B-12 (CYANOCOBALAMIN)  1000 MCG tablet Take 1,000 mcg by mouth daily.    [provider]    Allergies Duloxetine, Iodinated diagnostic agents, Meperidine, and Dilaudid [hydromorphone]  Family History  Problem Relation Age of Onset  . Osteoporosis Maternal Grandmother   . Esophageal cancer Maternal Grandfather     Social History Social History   Tobacco Use  . Smoking status: Former Smoker    Packs/day: 0.50    Years: 20.00    Pack years: 10.00    Types: Cigarettes    Quit date: 06/03/2017    Years since quitting: 1.2  . Smokeless tobacco: Never Used  . Tobacco comment:    Substance Use Topics  . Alcohol use: Yes    Alcohol/week: 4.0 standard drinks    Types: 4 Glasses of wine per week  . Drug use: No    Review of Systems  Constitutional: No fever/chills Eyes: No visual changes. ENT: No sore throat. Cardiovascular: Denies chest pain. Respiratory: Denies shortness of breath. Gastrointestinal: No abdominal pain.  No nausea, no vomiting.  No diarrhea.  Positive for constipation.  Positive for rectal bleeding. Genitourinary: Negative for dysuria. Musculoskeletal: Negative for back pain. Skin: Negative for rash. Neurological: Negative for headaches, focal weakness or numbness.  ____________________________________________   PHYSICAL EXAM:  VITAL SIGNS: ED Triage Vitals  Enc Vitals Group     BP 09/20/18 1736 126/60     Pulse --      Resp 09/20/18 1736 16     Temp 09/20/18 1736 98.7 F (37.1 C)     Temp src --      SpO2 09/20/18 1736 100 %     Weight 09/20/18 1739 143 lb 15.4 oz (65.3 kg)     Height --      Head Circumference --      Peak Flow --      Pain Score 09/20/18 1738 0     Pain Loc --      Pain Edu? --      Excl. in GC? --     Constitutional: Alert and oriented. Eyes: Conjunctivae are normal. Head: Atraumatic. Nose: No congestion/rhinnorhea. Mouth/Throat: Mucous membranes are moist. Neck: Normal ROM Cardiovascular: Normal rate, regular rhythm. Grossly  normal heart sounds. Respiratory: Normal respiratory effort.  No retractions. Lungs CTAB. Gastrointestinal: Soft and nontender. No distention.  Small posterior anal fissure with small amount of bleeding. Genitourinary: deferred Musculoskeletal: No lower extremity tenderness nor edema. Neurologic:  Normal speech and language. No gross focal neurologic deficits are appreciated. Skin:  Skin is warm, dry and intact. No rash noted. Psychiatric: Mood and affect are normal. Speech and behavior are normal.  ____________________________________________   LABS (all labs ordered are listed, but only abnormal results are displayed)  Labs Reviewed  COMPREHENSIVE METABOLIC PANEL - Abnormal; Notable for the following components:      Result Value   Glucose, Bld 105 (*)    Total Bilirubin 1.5 (*)    All other components within normal limits  CBC  POC OCCULT BLOOD, ED  POC URINE PREG, ED  TYPE AND SCREEN     PROCEDURES  Procedure(s) performed (including Critical Care):  Procedures   ____________________________________________   INITIAL IMPRESSION / ASSESSMENT AND PLAN / ED COURSE       43 year old female presents to the ED with intermittent rectal bleeding over the past couple of months, worse earlier today.  She has not had any bleeding following the episode earlier today and does not appear to have significant blood loss as she has no symptoms of anemia currently.  Her H&H is stable.  Noted to have minimal bleeding from fissures on rectal exam, will prescribe stool softener and Anusol suppositories.  Counseled patient to follow-up with PCP and return to the ED for new or worsening symptoms, patient agrees with plan.      ____________________________________________   FINAL CLINICAL IMPRESSION(S) / ED DIAGNOSES  Final diagnoses:  Rectal bleeding  Anal fissure     ED Discharge Orders    None       Note:  This document was prepared using Dragon voice recognition software  and may include unintentional dictation errors.   Chesley Noon, MD 09/20/18 [phone]

## 2018-09-20 NOTE — Progress Notes (Deleted)
       Patient: Krystal Marks Female    DOB: [birth year]   43 y.o.   MRN: [id number] Visit Date: 09/20/2018  Today's Provider: Mar Daring, PA-C   No chief complaint on file.  Subjective:     HPI  Blood in Stool: Patient presents for presents evaluation of blood in stool/ rectal bleeding. Patient has associated symptoms of {blood in stool sx:13468}. The patient denies {blood in stool sx:13468}.  The patient has a known history of: {rectal bleed risks:13469}. The patient has had {numbers:14653} episodes of rectal bleeding.  There {is/ isnot:9024} a history of rectal injury. Patient {has/ has not:15037} similar episodes of rectal bleeding in the past.   Allergies  Allergen Reactions  . Duloxetine Nausea And Vomiting    CAN NOT TAKE WITH IMITREX   . Iodinated Diagnostic Agents Itching    Omnpaque i 300, 10 min delayed reaction of large hives, itching & urticaria// will require 13hr prep for future iv contrast,//a.calhoun  . Meperidine     Other reaction(s): Respiratory Distress  . Dilaudid [Hydromorphone]     Hard times breathing     Current Outpatient Medications:  .  acyclovir (ZOVIRAX) 400 MG tablet, Take 1 tablet by mouth as needed., Disp: , Rfl: 1 .  amphetamine-dextroamphetamine (ADDERALL) 20 MG tablet, Take 1 tablet (20 mg total) by mouth 2 (two) times daily as needed., Disp: 60 tablet, Rfl: 0 .  buPROPion (WELLBUTRIN XL) 150 MG 24 hr tablet, TAKE 1 TABLET BY MOUTH EVERY DAY, Disp: 90 tablet, Rfl: 3 .  ketotifen (ZADITOR) 0.025 % ophthalmic solution, Place 1 drop into both eyes 2 (two) times daily., Disp: , Rfl:  .  meloxicam (MOBIC) 15 MG tablet, Take 15 mg by mouth daily., Disp: , Rfl:  .  Omega-3 Fatty Acids (FISH OIL) 1000 MG CPDR, Take by mouth., Disp: , Rfl:  .  omeprazole (PRILOSEC) 20 MG capsule, Take 20 mg by mouth daily., Disp: , Rfl:  .  predniSONE (STERAPRED UNI-PAK 21 TAB) 10 MG (21) TBPK tablet, Taper as directed., Disp: 21 tablet, Rfl: 0 .   SUMAtriptan (IMITREX) 100 MG tablet, Take 1 tablet (100 mg total) by mouth as needed., Disp: 10 tablet, Rfl: 11 .  UNABLE TO FIND, Place 1 application under the tongue at bedtime. Med Name: CBD oil with Frankincense , Disp: , Rfl:  .  vitamin B-12 (CYANOCOBALAMIN) 1000 MCG tablet, Take 1,000 mcg by mouth daily., Disp: , Rfl:   Review of Systems  Social History   Tobacco Use  . Smoking status: Former Smoker    Packs/day: 0.50    Years: 20.00    Pack years: 10.00    Types: Cigarettes    Quit date: 06/03/2017    Years since quitting: 1.2  . Smokeless tobacco: Never Used  . Tobacco comment:    Substance Use Topics  . Alcohol use: Yes    Alcohol/week: 4.0 standard drinks    Types: 4 Glasses of wine per week      Objective:   There were no vitals taken for this visit. There were no vitals filed for this visit.There is no height or weight on file to calculate BMI.   Physical Exam   No results found for any visits on 09/21/18.     Assessment & [place], PA-C  [facility]

## 2018-09-20 NOTE — ED Triage Notes (Signed)
States had a bowel movement 3 hours ago and "lost a lot of blood".  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  Had increased fiber in diet.  STates today after BM, felt fatigued.  Referred for ED evaluation by PCP.

## 2018-09-20 NOTE — ED Notes (Signed)
RN at bedside with MD for rectal assessment. Small amount of bleeding noted and MD verbalized visualization of an anal fissure.

## 2018-09-21 ENCOUNTER — Ambulatory Visit: Payer: BLUE CROSS/BLUE SHIELD | Admitting: Physician Assistant

## 2018-10-13 ENCOUNTER — Other Ambulatory Visit: Payer: Self-pay | Admitting: Family Medicine

## 2018-10-13 NOTE — Telephone Encounter (Signed)
Pt needing a refill on:  amphetamine-dextroamphetamine (ADDERALL) 20 MG tablet  Please fill at: [id number] - [place],  - [address] AT [address] [place] [phone] (Phone) [phone] (Fax)   Thanks, [facility]

## 2018-10-16 ENCOUNTER — Other Ambulatory Visit: Payer: Self-pay | Admitting: *Deleted

## 2018-10-16 MED ORDER — AMPHETAMINE-DEXTROAMPHETAMINE 20 MG PO TABS: 20.0000 mg | ORAL_TABLET | Freq: Two times a day (BID) | ORAL | 0 refills | Status: DC | PRN

## 2018-11-13 ENCOUNTER — Other Ambulatory Visit: Payer: Self-pay | Admitting: Family Medicine

## 2018-11-13 NOTE — Telephone Encounter (Signed)
Pt needing a refill on: ° °amphetamine-dextroamphetamine (ADDERALL) 20 MG tablet ° °Please fill at: °[id number] - [place], [place] - [address] [phone] (Phone) °[phone] (Fax)  ° °Thanks, °[facility] °

## 2018-11-16 ENCOUNTER — Other Ambulatory Visit: Payer: Self-pay | Admitting: Family Medicine

## 2018-11-16 NOTE — Telephone Encounter (Signed)
Please advise 

## 2018-11-16 NOTE — Telephone Encounter (Signed)
Pt called to check on the refill for amphetamine-dextroamphetamine (ADDERALL) 20 MG tablet that she had requested on 11/13/2018. Pt was advised that she needed an OV. Pt currently doesn't have insurance b/c she lost her job due to covid. Pt made an appt for 11/21/2018 but is requesting if the Rx for amphetamine-dextroamphetamine (ADDERALL) 20 MG tablet could be sent to [facility]. Please advise. Thanks TNP

## 2018-11-17 MED ORDER — AMPHETAMINE-DEXTROAMPHETAMINE 20 MG PO TABS: 20.0000 mg | ORAL_TABLET | Freq: Two times a day (BID) | ORAL | 0 refills | Status: DC | PRN

## 2018-11-17 NOTE — Telephone Encounter (Signed)
Pt calling back to check if her adderall was filled by Dr. Rosanna Randy.  She expresses she does have an appt with him on Nov.17th.  She is studing for an exam and needing this to help.  She also has a dizziness that causes a nausea if she doesn't take the adderall.  She is out.  Asking if it can be called in today.  Thanks, [employer]

## 2018-11-17 NOTE — Progress Notes (Deleted)
{Method of visit:23308}  Patient: Krystal Marks Female    DOB: 05-18-1975   43 y.o.   MRN: [id number] Visit Date: 11/17/2018  Today's Provider: Wilhemena Durie, MD   No chief complaint on file.  Subjective:     HPI   Attention deficit hyperactivity disorder (ADHD), other type From 02/01/2018-Patient to only use Adderall for working.  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  Follow clinically.  May need lifelong PPI  Anxiety, generalized From 02/01/2018-Ongoing issue.  presently stable.  Allergies  Allergen Reactions  . Duloxetine Nausea And Vomiting    CAN NOT TAKE WITH IMITREX   . Iodinated Diagnostic Agents Itching    Omnpaque i 300, 10 min delayed reaction of large hives, itching & urticaria// will require 13hr prep for future iv contrast,//a.calhoun  . Meperidine     Other reaction(s): Respiratory Distress  . Dilaudid [Hydromorphone]     Hard times breathing     Current Outpatient Medications:  .  acyclovir (ZOVIRAX) 400 MG tablet, Take 1 tablet by mouth as needed., Disp: , Rfl: 1 .  amphetamine-dextroamphetamine (ADDERALL) 20 MG tablet, Take 1 tablet (20 mg total) by mouth 2 (two) times daily as needed., Disp: 60 tablet, Rfl: 0 .  buPROPion (WELLBUTRIN XL) 150 MG 24 hr tablet, TAKE 1 TABLET BY MOUTH EVERY DAY, Disp: 90 tablet, Rfl: 3 .  docusate sodium (COLACE) 100 MG capsule, Take 1 capsule (100 mg total) by mouth 2 (two) times daily., Disp: 60 capsule, Rfl: 1 .  ketotifen (ZADITOR) 0.025 % ophthalmic solution, Place 1 drop into both eyes 2 (two) times daily., Disp: , Rfl:  .  meloxicam (MOBIC) 15 MG tablet, Take 15 mg by mouth daily., Disp: , Rfl:  .  Omega-3 Fatty Acids (FISH OIL) 1000 MG CPDR, Take by mouth., Disp: , Rfl:  .  omeprazole (PRILOSEC) 20 MG capsule, Take 20 mg by mouth daily., Disp: , Rfl:  .  predniSONE (STERAPRED UNI-PAK 21 TAB) 10 MG (21) TBPK tablet, Taper as directed., Disp: 21 tablet, Rfl: 0 .  SUMAtriptan  (IMITREX) 100 MG tablet, Take 1 tablet (100 mg total) by mouth as needed., Disp: 10 tablet, Rfl: 11 .  UNABLE TO FIND, Place 1 application under the tongue at bedtime. Med Name: CBD oil with Frankincense , Disp: , Rfl:  .  vitamin B-12 (CYANOCOBALAMIN) 1000 MCG tablet, Take 1,000 mcg by mouth daily., Disp: , Rfl:   Review of Systems  Constitutional: Negative for appetite change, chills, fatigue and fever.  Respiratory: Negative for chest tightness and shortness of breath.   Cardiovascular: Negative for chest pain and palpitations.  Gastrointestinal: Negative for abdominal pain, nausea and vomiting.  Neurological: Negative for dizziness and weakness.    Social History   Tobacco Use  . Smoking status: Former Smoker    Packs/day: 0.50    Years: 20.00    Pack years: 10.00    Types: Cigarettes    Quit date: 06/03/2017    Years since quitting: 1.4  . Smokeless tobacco: Never Used  . Tobacco comment:    Substance Use Topics  . Alcohol use: Yes    Alcohol/week: 4.0 standard drinks    Types: 4 Glasses of wine per week      Objective:   There were no vitals taken for this visit. There were no vitals filed for this visit.There is no height or weight on file to calculate BMI.   Physical Exam   No results found for any visits  on 11/21/18.     Assessment & Plan        Megan Mans, MD  [facility]

## 2018-11-21 ENCOUNTER — Ambulatory Visit: Payer: Self-pay | Admitting: Family Medicine

## 2018-12-11 ENCOUNTER — Telehealth: Payer: Self-pay

## 2018-12-11 NOTE — Telephone Encounter (Signed)
Copied from [place] [id number]. Topic: Appointment Scheduling - Scheduling Inquiry for Clinic >> Dec 11, 2018  1:55 PM Burchel, Abbi R wrote: Reason for CRM: Pt called to resch her appt from 11/17. Pt now has some COVID sx.  I recommended tha pt be tested for COVID, and change her appt to virtual visit. Please call pt to schedule virtual visit with Dr Rosanna Randy as soon as possible.

## 2018-12-12 NOTE — Telephone Encounter (Signed)
Appointment scheduled.

## 2018-12-15 NOTE — Progress Notes (Signed)
Patient: Krystal Marks Female    DOB: 1975/07/12   43 y.o.   MRN: [id number] Visit Date: 12/15/2018  Today's Provider: Megan Mans, MD   No chief complaint on file.  Subjective:    Virtual Visit via Video Note  I connected with Ozella Rocks on 12/18/18 at  3:20 PM EST by a video enabled telemedicine application and verified that I am speaking with the correct person using two identifiers.  Location: Patient: Home Provider: Office   I discussed the limitations of evaluation and management by telemedicine and the availability of in person appointments. The patient expressed understanding and agreed to proceed.   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  She took her last Adderall yesterday.  She needs refills.  He has been taking them on weekends recently because of studying for the exam. Also of note recently is that she has had initial insomnia.  She does sometimes take her second Adderall about 12 noon or 1 PM I told her this might be causing it.  She states that she also has some fibromyalgia on the right side of her body.  No injury, no other symptoms. Allergies  Allergen Reactions  . Duloxetine Nausea And Vomiting    CAN NOT TAKE WITH IMITREX   . Iodinated Diagnostic Agents Itching    Omnpaque i 300, 10 min delayed reaction of large hives, itching & urticaria// will require 13hr prep for future iv contrast,//a.calhoun  . Meperidine     Other reaction(s): Respiratory Distress  . Dilaudid [Hydromorphone]     Hard times breathing     Current Outpatient Medications:  .  acyclovir (ZOVIRAX) 400 MG tablet, Take 1 tablet by mouth as needed., Disp: , Rfl: 1 .  amphetamine-dextroamphetamine (ADDERALL) 20 MG tablet, Take 1 tablet (20 mg total) by mouth 2 (two) times daily as needed., Disp: 60 tablet, Rfl: 0 .  buPROPion (WELLBUTRIN XL) 150 MG 24 hr tablet, TAKE 1 TABLET BY  MOUTH EVERY DAY, Disp: 90 tablet, Rfl: 3 .  ketotifen (ZADITOR) 0.025 % ophthalmic solution, Place 1 drop into both eyes 2 (two) times daily., Disp: , Rfl:  .  meloxicam (MOBIC) 15 MG tablet, Take 15 mg by mouth daily., Disp: , Rfl:  .  Omega-3 Fatty Acids (FISH OIL) 1000 MG CPDR, Take by mouth., Disp: , Rfl:  .  omeprazole (PRILOSEC) 20 MG capsule, Take 20 mg by mouth daily., Disp: , Rfl:  .  predniSONE (STERAPRED UNI-PAK 21 TAB) 10 MG (21) TBPK tablet, Taper as directed., Disp: 21 tablet, Rfl: 0 .  SUMAtriptan (IMITREX) 100 MG tablet, Take 1 tablet (100 mg total) by mouth as needed., Disp: 10 tablet, Rfl: 11 .  UNABLE TO FIND, Place 1 application under the tongue at bedtime. Med Name: CBD oil with Frankincense , Disp: , Rfl:  .  vitamin B-12 (CYANOCOBALAMIN) 1000 MCG tablet, Take 1,000 mcg by mouth daily., Disp: , Rfl:   Review of Systems  Constitutional: Negative for appetite change, chills, fatigue and fever.  Respiratory: Negative for chest tightness and shortness of breath.   Cardiovascular: Negative for chest pain and palpitations.  Gastrointestinal: Negative for abdominal pain, nausea and vomiting.  Neurological: Negative for dizziness and weakness.    Social History   Tobacco Use  . Smoking status: Former Smoker    Packs/day: 0.50    Years: 20.00  Pack years: 10.00    Types: Cigarettes    Quit date: 06/03/2017    Years since quitting: 1.5  . Smokeless tobacco: Never Used  . Tobacco comment:    Substance Use Topics  . Alcohol use: Yes    Alcohol/week: 4.0 standard drinks    Types: 4 Glasses of wine per week      Objective:   There were no vitals taken for this visit. There were no vitals filed for this visit.There is no height or weight on file to calculate BMI.   Physical Exam   No results found for any visits on 12/18/18.     Assessment & Plan     1. Attention deficit hyperactivity disorder (ADHD), combined type Refill Adderall x3.  I will see her back in  the spring  2. Other insomnia With the fibromyalgia we will also try nortriptyline 25 mg at bedtime.  Again see her back in the spring.  3. Cigarette smoker Advised to quit smoking especially in view of her insomnia.  The nicotine could be habit and affect in this 43 year old.  4. Fibromyalgia Nortriptyline as above.  I discussed the assessment and treatment plan with the patient. The patient was provided an opportunity to ask questions and all were answered. The patient agreed with the plan and demonstrated an understanding of the instructions.   The patient was advised to call back or seek an in-person evaluation if the symptoms worsen or if the condition fails to improve as anticipated.  I provided 15 minutes of non-face-to-face time during this encounter.    Richard Cranford Mon, MD  [facility]

## 2018-12-18 ENCOUNTER — Other Ambulatory Visit: Payer: Self-pay

## 2018-12-18 ENCOUNTER — Ambulatory Visit (INDEPENDENT_AMBULATORY_CARE_PROVIDER_SITE_OTHER): Payer: Self-pay | Admitting: Family Medicine

## 2018-12-18 DIAGNOSIS — F902 Attention-deficit hyperactivity disorder, combined type: Secondary | ICD-10-CM

## 2018-12-18 DIAGNOSIS — F17210 Nicotine dependence, cigarettes, uncomplicated: Secondary | ICD-10-CM

## 2018-12-18 DIAGNOSIS — Z20822 Contact with and (suspected) exposure to covid-19: Secondary | ICD-10-CM

## 2018-12-18 DIAGNOSIS — G4709 Other insomnia: Secondary | ICD-10-CM

## 2018-12-18 DIAGNOSIS — M797 Fibromyalgia: Secondary | ICD-10-CM

## 2018-12-18 MED ORDER — AMPHETAMINE-DEXTROAMPHETAMINE 20 MG PO TABS: 20.0000 mg | ORAL_TABLET | Freq: Two times a day (BID) | ORAL | 0 refills | Status: DC

## 2018-12-18 MED ORDER — NORTRIPTYLINE HCL 25 MG PO CAPS: 25.0000 mg | ORAL_CAPSULE | Freq: Every day | ORAL | 5 refills | Status: DC

## 2018-12-18 MED ORDER — AMPHETAMINE-DEXTROAMPHETAMINE 20 MG PO TABS: 20.0000 mg | ORAL_TABLET | Freq: Two times a day (BID) | ORAL | 0 refills | Status: DC | PRN

## 2018-12-20 LAB — NOVEL CORONAVIRUS, NAA: SARS-CoV-2, NAA: NOT DETECTED

## 2019-01-09 ENCOUNTER — Other Ambulatory Visit: Payer: Self-pay | Admitting: Family Medicine

## 2019-01-11 ENCOUNTER — Other Ambulatory Visit: Payer: Self-pay | Admitting: Family Medicine

## 2019-01-11 NOTE — Telephone Encounter (Signed)
Pt stated she is on nortriptyline (PAMELOR) 25 MG capsule at bedtime,and things started out fine but now it is taking her forever to fall asleep. She wonders if Dr. Sullivan Lone would increase the dosage.  Pt has 7 days left of medication.  Also needs refill for SUMAtriptan (IMITREX) 100 MG tablet  [id number] [place], [place] - [address] Phone:  [phone]  Fax:  [phone]

## 2019-01-12 MED ORDER — SUMATRIPTAN SUCCINATE 100 MG PO TABS: 100.0000 mg | ORAL_TABLET | ORAL | 11 refills | Status: DC | PRN

## 2019-01-12 MED ORDER — NORTRIPTYLINE HCL 25 MG PO CAPS: ORAL_CAPSULE | ORAL | 1 refills | Status: DC

## 2019-02-07 ENCOUNTER — Encounter: Payer: Self-pay | Admitting: Family Medicine

## 2019-02-27 ENCOUNTER — Telehealth: Payer: Self-pay

## 2019-02-27 NOTE — Telephone Encounter (Signed)
NOTES ON FILE FROM [facility] [phone] REFERRL SENT TO SCHEDULING

## 2019-03-27 ENCOUNTER — Encounter: Payer: Self-pay | Admitting: Internal Medicine

## 2019-03-27 ENCOUNTER — Other Ambulatory Visit: Payer: Self-pay

## 2019-03-27 ENCOUNTER — Ambulatory Visit (INDEPENDENT_AMBULATORY_CARE_PROVIDER_SITE_OTHER): Payer: Self-pay | Admitting: Internal Medicine

## 2019-03-27 VITALS — BP 105/64 | HR 62 | Resp 15 | Ht 65.75 in | Wt 153.8 lb

## 2019-03-27 DIAGNOSIS — G90A Postural orthostatic tachycardia syndrome (POTS): Secondary | ICD-10-CM

## 2019-03-27 DIAGNOSIS — I498 Other specified cardiac arrhythmias: Secondary | ICD-10-CM

## 2019-03-27 NOTE — Patient Instructions (Addendum)
Medication Instructions:  Your physician recommends that you continue on your current medications as directed. Please refer to the Current Medication list given to you today.  *If you need a refill on your cardiac medications before your next appointment, please call your pharmacy*   Lab Work: None ordered.  If you have labs (blood work) drawn today and your tests are completely normal, you will receive your results only by: Marland Kitchen MyChart Message (if you have MyChart) OR . A paper copy in the mail If you have any lab test that is abnormal or we need to change your treatment, we will call you to review the results.   Testing/Procedures: Your physician has recommended that you wear an event monitor. Event monitors are medical devices that record the heart's electrical activity. Doctors most often [place] these monitors to diagnose arrhythmias. Arrhythmias are problems with the speed or rhythm of the heartbeat. The monitor is a small, portable device. You can wear one while you do your normal daily activities. This is usually used to diagnose what is causing palpitations/syncope (passing out).    Follow-Up: At [facility], you and your health needs are our priority.  As part of our continuing mission to provide you with exceptional heart care, we have created designated Provider Care Teams.  These Care Teams include your primary Cardiologist (physician) and Advanced Practice Providers (APPs -  Physician Assistants and Nurse Practitioners) who all work together to provide you with the care you need, when you need it.  We recommend signing up for the patient portal called "MyChart".  Sign up information is provided on this After Visit Summary.  MyChart is used to connect with patients for Virtual Visits (Telemedicine).  Patients are able to view lab/test results, encounter notes, upcoming appointments, etc.  Non-urgent messages can be sent to your provider as well.   To learn more about what you can do  with MyChart, go to [email or web address].    Your next appointment:  Thursday 05/17/2019 at 345pm   The format for your next appointment:  Virtual   Provider:  Dr Graciela Husbands       ---

## 2019-03-27 NOTE — Progress Notes (Signed)
ELECTROPHYSIOLOGY OFFICE NOTE  Patient ID: Krystal Marks, MRN: [id number], DOB/AGE: [birth year] 44 y.o. Admit date: (Not on file) Date of Consult: 03/27/2019  Primary Physician: Maple Hudson., MD Primary Cardiologist:         HPI Krystal Marks is a 44 y.o. female seen at her own request because she does not know where else to go and she read something about pots and wonders whether she has it.  She notes migraines dating back about 20 years.  This was an ongoing issue.  She was also struggling with anxiety for these years and was on clonazepam from 2002-2018.  2017 she was diagnosed with fibromyalgia.  In 2019 she underwent knee surgery which she states thinks was associated with a triggering of profound fatigue and dizziness.  She describes this is debilitating.  Notably, it comes typically not the beginning of the day but midmorning.  Some days are great and she feels like she is on the top of the world and other days she is very limited.  She has wondered whether she sometimes is manic.  She describes being swimmy headed and struggling with cognitive dysfunction with word finding difficulties.  This also dates to 6/19. She ihasLongstanding sleep disturbance.  Insomnia.  Not sleeping for days.  This prompted a excess intake of clonazepam and alcohol that resulted in an emergency room visit.  Anxiety and stress is repeatedly described in her story.  She describes adrenaline excess giving rise to "flight or fight "  She also described palpitations where she feels like her "heart rolls over ".  Does not have an issue with shower temperature but showers are associated with significant fatigue.  Washing her hair is also very tiring    She took clonazepam for about 18 years.  She has weaned herself off of it.  Her alcohol intake is noteworthy up to a bottle of wine a night (which she acknowledges) her past medical history notes alcohol abuse.  She had a Holter  monitor at her normal clinic 2018.  The report was reviewed and noted PACs and PVCs.     Past Medical History:  Diagnosis Date  . Acid reflux 08/16/2014  . ADD (attention deficit disorder)   . Alcohol abuse 05/25/2017  . Anxiety   . Anxiety, generalized 08/16/2014  . 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 Benzodiazepine abuse (HCC) 05/25/2017  . Benzodiazepine overdose 05/25/2017  . Cigarette smoker 08/16/2014  . Depression   . Depression, major, single episode, moderate (HCC) 08/16/2014  . Fatigue 01/20/2017  . Fibromyalgia   . Gastroduodenal ulcer 08/16/2014  . GERD (gastroesophageal reflux disease)   . IBS (irritable bowel syndrome)   . Irregular bleeding 08/16/2014  . Migraine 08/16/2014  . Migraine headache    monthly  . 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      Surgical History:  Past Surgical History:  Procedure Laterality Date  . BREAST ENHANCEMENT SURGERY  2007  . COLONOSCOPY    . ESOPHAGOGASTRODUODENOSCOPY     x4  . KNEE ARTHROSCOPY WITH PATELLA RECONSTRUCTION Left 07/19/2017   Procedure: KNEE ARTHROSCOPY PARTIAL MEDIAL MENISECTOMY PATELLA CHONDROPLASTY;  Surgeon: Signa Kell, MD;  Location: [facility];  Service: Orthopedics;  Laterality: Left;  . SHOULDER ARTHROSCOPY Right      Home Meds: Current Meds  Medication Sig  . amphetamine-dextroamphetamine (ADDERALL) 20 MG tablet Take 1 tablet (20 mg total) by mouth 2 (two) times daily as needed.  Marland Kitchen buPROPion (  WELLBUTRIN XL) 150 MG 24 hr tablet TAKE 1 TABLET BY MOUTH EVERY DAY  . ketotifen (ZADITOR) 0.025 % ophthalmic solution Place 1 drop into both eyes 2 (two) times daily.  . meloxicam (MOBIC) 15 MG tablet Take 15 mg by mouth daily.  . nortriptyline (PAMELOR) 25 MG capsule TAKE 1 CAPSULE BY MOUTH EVERY DAY AT BEDTIME  . Omega-3 Fatty Acids (FISH OIL) 1000 MG CPDR Take by mouth.  Marland Kitchen omeprazole (PRILOSEC) 20 MG capsule Take 20 mg by mouth daily.  . predniSONE (STERAPRED  UNI-PAK 21 TAB) 10 MG (21) TBPK tablet Taper as directed.  . SUMAtriptan (IMITREX) 100 MG tablet Take 1 tablet (100 mg total) by mouth as needed.  Marland Kitchen UNABLE TO FIND Place 1 application under the tongue at bedtime. Med Name: CBD oil with Frankincense   . vitamin B-12 (CYANOCOBALAMIN) 1000 MCG tablet Take 1,000 mcg by mouth daily.    Allergies:  Allergies  Allergen Reactions  . Duloxetine Nausea And Vomiting    CAN NOT TAKE WITH IMITREX   . Iodinated Diagnostic Agents Itching    Omnpaque i 300, 10 min delayed reaction of large hives, itching & urticaria// will require 13hr prep for future iv contrast,//a.calhoun  . Meperidine     Other reaction(s): Respiratory Distress  . Dilaudid [Hydromorphone]     Hard times breathing    Social History   Socioeconomic History  . Marital status: Divorced    Spouse name: Not on file  . Number of children: Not on file  . Years of education: Not on file  . Highest education level: Not on file  Occupational History  . Not on file  Tobacco Use  . Smoking status: Former Smoker    Packs/day: 0.50    Years: 20.00    Pack years: 10.00    Types: Cigarettes    Quit date: 06/03/2017    Years since quitting: 1.8  . Smokeless tobacco: Never Used  . Tobacco comment:    Substance and Sexual Activity  . Alcohol use: Yes    Alcohol/week: 4.0 standard drinks    Types: 4 Glasses of wine per week  . Drug use: No  . Sexual activity: Not on file  Other Topics Concern  . Not on file  Social History Narrative  . Not on file   Social Determinants of Health   Financial Resource Strain:   . Difficulty of Paying Living Expenses:   Food Insecurity:   . Worried About Programme researcher, broadcasting/film/video in the Last Year:   . Barista in the Last Year:   Transportation Needs:   . Freight forwarder (Medical):   Marland Kitchen Lack of Transportation (Non-Medical):   Physical Activity:   . Days of Exercise per Week:   . Minutes of Exercise per Session:   Stress:   . Feeling  of Stress :   Social Connections:   . Frequency of Communication with Friends and Family:   . Frequency of Social Gatherings with Friends and Family:   . Attends Religious Services:   . Active Member of Clubs or Organizations:   . Attends Banker Meetings:   Marland Kitchen Marital Status:   Intimate Partner Violence:   . Fear of Current or Ex-Partner:   . Emotionally Abused:   Marland Kitchen Physically Abused:   . Sexually Abused:      Family History  Problem Relation Age of Onset  . Osteoporosis Maternal Grandmother   . Esophageal cancer Maternal Grandfather  ROS:  Please see the history of present illness.     All other systems reviewed and negative.    Physical Exam: Blood pressure 105/64, pulse 62, resp. rate 15, height 5' 5.75" (1.67 m), weight 153 lb 12.8 oz (69.8 kg), SpO2 99 %. General: Well developed, well nourished female in no acute distress. Head: Normocephalic, atraumatic, sclera non-icteric, no xanthomas, nares are without discharge. EENT: normal  Lymph Nodes:  none Neck: Negative for carotid bruits. JVD not elevated. Back:without scoliosis kyphosis Lungs: Clear bilaterally to auscultation without wheezes, rales, or rhonchi. Breathing is unlabored. Heart: RRR with S1 S2. No   murmur . No rubs, or gallops appreciated. Abdomen: Soft, non-tender, non-distended with normoactive bowel sounds. No hepatomegaly. No rebound/guarding. No obvious abdominal masses. Msk:  Strength and tone appear normal for age. Extremities: No clubbing or cyanosis. No  edema.  Distal pedal pulses are 2+ and equal bilaterally. Skin: Warm and Dry Neuro: Alert and oriented X 3. CN III-XII intact Grossly normal sensory and motor function . Psych:  Responds to questions appropriately with a normal affect.      Labs: Cardiac Enzymes No results for input(s): CKTOTAL, CKMB, TROPONINI in the last 72 hours. CBC Lab Results  Component Value Date   WBC 7.5 09/20/2018   HGB 14.1 09/20/2018   HCT 41.5  09/20/2018   MCV 93.3 09/20/2018   PLT 271 09/20/2018   PROTIME: No results for input(s): LABPROT, INR in the last 72 hours. Chemistry No results for input(s): NA, K, CL, CO2, BUN, CREATININE, CALCIUM, PROT, BILITOT, ALKPHOS, ALT, AST, GLUCOSE in the last 168 hours.  Invalid input(s): LABALBU Lipids Lab Results  Component Value Date   CHOL 141 01/20/2017   HDL 66 01/20/2017   LDLCALC 67 01/20/2017   TRIG 42 01/20/2017   BNP No results found for: PROBNP Thyroid Function Tests: No results for input(s): TSH, T4TOTAL, T3FREE, THYROIDAB in the last 72 hours.  Invalid input(s): FREET3 Miscellaneous No results found for: DDIMER  Radiology/Studies:  No results found.  EKG:     Assessment and Plan:  Palpitations  Fatigue  Cognitive dysfunction   Anxiety/dperession   Alcohol use    The patient has a longstanding history of anxiety/stress but has been accompanied by sleep disturbances a diagnosis of fibromyalgia, longstanding migraines.  The last 2 years have been associated with significantly worsened fatigue following a operative procedure on her knee.  Interestingly, there are days that are really quite good interspersed with days that are bad.  The changing of a day from a good day to a bad day occurs midmorning; this may be related to either meals or showers.  She will work on trying to elucidate this.  The sporadic nature of her symptoms, with very disparate conditions with really good days distinct from really bad days is not consistent with my experience with dysautonomia.  Moreover, she does not describe heat intolerance.    On the other hand, menses intolerance would support a diagnosis of dysautonomia.  Exercise intolerance also seems quite variable.  Anxiety/stress has also been a huge part of her story.  She has wondered whether she has mania.  I have suggested that she pursue this inclination and have given her the name of a couple of psychiatrist with whom she  might be able to discuss her story and thoughts as to whether such a diagnosis would be appropriate or not.  Her diet is salt and water replete.  We will use a Zio patch to  try to see if there are objective measurements on heart rate between good days and bad days.  The vital signs today showed some increase in heart rate but this was accompanied by an increase in blood pressure.  This suggests possibly a exuberant sympathetic response.  She has never been tried on a beta-blocker.  Her sleep disturbance has been much improved since addition of nortriptyline.   Virl Axe   Urine metanephrines 8/18 scanned normal  +

## 2019-03-28 ENCOUNTER — Telehealth: Payer: Self-pay | Admitting: Radiology

## 2019-03-28 ENCOUNTER — Encounter: Payer: 59 | Admitting: Family Medicine

## 2019-03-28 NOTE — Progress Notes (Signed)
       Patient: Krystal Marks Female    DOB: November 18, 1975   44 y.o.   MRN: [id number] Visit Date: 03/28/2019  Today's Provider: Megan Mans, MD   Chief Complaint  Patient presents with  . Medication Refill   Subjective:     HPI   Patient presents today for medication refill.   Allergies  Allergen Reactions  . Duloxetine Nausea And Vomiting    CAN NOT TAKE WITH IMITREX   . Iodinated Diagnostic Agents Itching    Omnpaque i 300, 10 min delayed reaction of large hives, itching & urticaria// will require 13hr prep for future iv contrast,//a.calhoun  . Meperidine     Other reaction(s): Respiratory Distress  . Dilaudid [Hydromorphone]     Hard times breathing     Current Outpatient Medications:  .  amphetamine-dextroamphetamine (ADDERALL) 20 MG tablet, Take 1 tablet (20 mg total) by mouth 2 (two) times daily as needed., Disp: 60 tablet, Rfl: 0 .  buPROPion (WELLBUTRIN XL) 150 MG 24 hr tablet, TAKE 1 TABLET BY MOUTH EVERY DAY, Disp: 90 tablet, Rfl: 3 .  ketotifen (ZADITOR) 0.025 % ophthalmic solution, Place 1 drop into both eyes 2 (two) times daily., Disp: , Rfl:  .  meloxicam (MOBIC) 15 MG tablet, Take 15 mg by mouth daily., Disp: , Rfl:  .  nortriptyline (PAMELOR) 25 MG capsule, TAKE 1 CAPSULE BY MOUTH EVERY DAY AT BEDTIME, Disp: 90 capsule, Rfl: 1 .  Omega-3 Fatty Acids (FISH OIL) 1000 MG CPDR, Take by mouth., Disp: , Rfl:  .  omeprazole (PRILOSEC) 20 MG capsule, Take 20 mg by mouth daily., Disp: , Rfl:  .  predniSONE (STERAPRED UNI-PAK 21 TAB) 10 MG (21) TBPK tablet, Taper as directed., Disp: 21 tablet, Rfl: 0 .  SUMAtriptan (IMITREX) 100 MG tablet, Take 1 tablet (100 mg total) by mouth as needed., Disp: 10 tablet, Rfl: 11 .  UNABLE TO FIND, Place 1 application under the tongue at bedtime. Med Name: CBD oil with Frankincense , Disp: , Rfl:  .  vitamin B-12 (CYANOCOBALAMIN) 1000 MCG tablet, Take 1,000 mcg by mouth daily., Disp: , Rfl:   Review of Systems  Social  History   Tobacco Use  . Smoking status: Former Smoker    Packs/day: 0.50    Years: 20.00    Pack years: 10.00    Types: Cigarettes    Quit date: 06/03/2017    Years since quitting: 1.8  . Smokeless tobacco: Never Used  . Tobacco comment:    Substance Use Topics  . Alcohol use: Yes    Alcohol/week: 4.0 standard drinks    Types: 4 Glasses of wine per week      Objective:   There were no vitals taken for this visit. There were no vitals filed for this visit.There is no height or weight on file to calculate BMI.   Physical Exam   No results found for any visits on 03/28/19.     Assessment & Plan        Megan Mans, MD  [facility]

## 2019-03-28 NOTE — Telephone Encounter (Signed)
Enrolled patient for a 14 day Zio monitor to be mailed to patients home.  

## 2019-03-29 ENCOUNTER — Telehealth (INDEPENDENT_AMBULATORY_CARE_PROVIDER_SITE_OTHER): Payer: 59 | Admitting: Family Medicine

## 2019-03-29 ENCOUNTER — Encounter: Payer: Self-pay | Admitting: Family Medicine

## 2019-03-29 DIAGNOSIS — F902 Attention-deficit hyperactivity disorder, combined type: Secondary | ICD-10-CM | POA: Diagnosis not present

## 2019-03-29 DIAGNOSIS — M797 Fibromyalgia: Secondary | ICD-10-CM

## 2019-03-29 DIAGNOSIS — G43919 Migraine, unspecified, intractable, without status migrainosus: Secondary | ICD-10-CM

## 2019-03-29 DIAGNOSIS — R5383 Other fatigue: Secondary | ICD-10-CM | POA: Diagnosis not present

## 2019-03-29 MED ORDER — AMPHETAMINE-DEXTROAMPHETAMINE 20 MG PO TABS: 20.0000 mg | ORAL_TABLET | Freq: Two times a day (BID) | ORAL | 0 refills | Status: DC | PRN

## 2019-03-29 NOTE — Progress Notes (Signed)
Patient: Krystal Marks Female    DOB: 09-13-1975   44 y.o.   MRN: [id number] Visit Date: 03/29/2019  Today's Provider: Wilhemena Durie, MD   Chief Complaint  Patient presents with  . Follow-up   Subjective:    Virtual Visit via Video Note  I connected with Krystal Marks on 03/29/19 at  3:20 PM EDT by a video enabled telemedicine application and verified that I am speaking with the correct person using two identifiers.  Location: Patient: Home Provider: Office   I discussed the limitations of evaluation and management by telemedicine and the availability of in person appointments. The patient expressed understanding and agreed to proceed.   HPI   Patient would like to know if Nortriptyline causes weight gain.  She has gained about 12 pounds during the Covid pandemic.  This is approximate is I do not have a weight. Patient on 25 mg nightly for sleep and it helps her sleep well. She complains of infrequent but occasional midmorning fatigue which is at times disabling. She is seeing Dr. Caryl Comes from cardiology in [place] for an EP exam for possible POTS. He stated that to complete her work-up he would like to get a cortisol level.  Follow up for ADHD:  The patient was last seen for this 3 months ago. Changes made at last visit include none; Adderall was refilled.  She reports good compliance with treatment. She feels that condition is Improved. She is not having side effects.   ------------------------------------------------------------------------------------   Follow up for Insomnia:  The patient was last seen for this 3 months ago. Changes made at last visit include started trial of Nortriptyline 25mg  at bedtime.  She reports good compliance with treatment. She feels that condition is Improved. She is not having side effects.   ------------------------------------------------------------------------------------   Follow up for  Fibromyalgia:  The patient was last seen for this 3 months ago. Changes made at last visit include started trial of Nortryptyline.  She reports good compliance with treatment. She feels that condition is Improved. She is not having side effects.   ------------------------------------------------------------------------------------   Allergies  Allergen Reactions  . Duloxetine Nausea And Vomiting    CAN NOT TAKE WITH IMITREX   . Iodinated Diagnostic Agents Itching    Omnpaque i 300, 10 min delayed reaction of large hives, itching & urticaria// will require 13hr prep for future iv contrast,//a.calhoun  . Meperidine     Other reaction(s): Respiratory Distress  . Dilaudid [Hydromorphone]     Hard times breathing     Current Outpatient Medications:  .  amphetamine-dextroamphetamine (ADDERALL) 20 MG tablet, Take 1 tablet (20 mg total) by mouth 2 (two) times daily as needed., Disp: 60 tablet, Rfl: 0 .  buPROPion (WELLBUTRIN XL) 150 MG 24 hr tablet, TAKE 1 TABLET BY MOUTH EVERY DAY, Disp: 90 tablet, Rfl: 3 .  ketotifen (ZADITOR) 0.025 % ophthalmic solution, Place 1 drop into both eyes 2 (two) times daily., Disp: , Rfl:  .  meloxicam (MOBIC) 15 MG tablet, Take 15 mg by mouth daily., Disp: , Rfl:  .  nortriptyline (PAMELOR) 25 MG capsule, TAKE 1 CAPSULE BY MOUTH EVERY DAY AT BEDTIME, Disp: 90 capsule, Rfl: 1 .  Omega-3 Fatty Acids (FISH OIL) 1000 MG CPDR, Take by mouth., Disp: , Rfl:  .  omeprazole (PRILOSEC) 20 MG capsule, Take 20 mg by mouth daily., Disp: , Rfl:  .  predniSONE (STERAPRED UNI-PAK 21 TAB) 10 MG (21) TBPK tablet, Taper as  directed. (Patient not taking: Reported on 03/29/2019), Disp: 21 tablet, Rfl: 0 .  SUMAtriptan (IMITREX) 100 MG tablet, Take 1 tablet (100 mg total) by mouth as needed., Disp: 10 tablet, Rfl: 11 .  UNABLE TO FIND, Place 1 application under the tongue at bedtime. Med Name: CBD oil with Frankincense , Disp: , Rfl:  .  vitamin B-12 (CYANOCOBALAMIN) 1000 MCG  tablet, Take 1,000 mcg by mouth daily., Disp: , Rfl:   Review of Systems  Constitutional: Negative for appetite change, chills, fatigue and fever.  Respiratory: Negative for chest tightness and shortness of breath.   Cardiovascular: Negative for chest pain and palpitations.  Gastrointestinal: Negative for abdominal pain, nausea and vomiting.  Neurological: Negative for dizziness and weakness.    Social History   Tobacco Use  . Smoking status: Former Smoker    Packs/day: 0.50    Years: 20.00    Pack years: 10.00    Types: Cigarettes    Quit date: 06/03/2017    Years since quitting: 1.8  . Smokeless tobacco: Never Used  . Tobacco comment:    Substance Use Topics  . Alcohol use: Yes    Alcohol/week: 4.0 standard drinks    Types: 4 Glasses of wine per week      Objective:   There were no vitals taken for this visit. There were no vitals filed for this visit.There is no height or weight on file to calculate BMI.   Physical Exam   No results found for any visits on 03/29/19.     Assessment & Plan     1. Attention deficit hyperactivity disorder (ADHD), combined type Refill Adderall x2.  I would like to see her back in late May early June. - amphetamine-dextroamphetamine (ADDERALL) 20 MG tablet; Take 1 tablet (20 mg total) by mouth 2 (two) times daily as needed.  Dispense: 60 tablet; Refill: 0  2. Fatigue, unspecified type A.m. cortisol was ordered. - Cortisol-am, blood  3. Intractable migraine without status migrainosus, unspecified migraine type It could be that the fatigue spell she is having are a migraine without the headache.  4. Fibromyalgia Doing well on Pamelor 25 mg nightly.  Gust gabapentin but she would rather get a good night sleep.  I discussed the assessment and treatment plan with the patient. The patient was provided an opportunity to ask questions and all were answered. The patient agreed with the plan and demonstrated an understanding of the  instructions.   The patient was advised to call back or seek an in-person evaluation if the symptoms worsen or if the condition fails to improve as anticipated.  I provided 16 minutes of non-face-to-face time during this encounter.    Krystal Marks Wendelyn Breslow, MD  [facility]

## 2019-03-30 ENCOUNTER — Other Ambulatory Visit (INDEPENDENT_AMBULATORY_CARE_PROVIDER_SITE_OTHER): Payer: 59

## 2019-03-30 DIAGNOSIS — I498 Other specified cardiac arrhythmias: Secondary | ICD-10-CM

## 2019-03-30 DIAGNOSIS — R002 Palpitations: Secondary | ICD-10-CM

## 2019-03-30 DIAGNOSIS — G90A Postural orthostatic tachycardia syndrome (POTS): Secondary | ICD-10-CM

## 2019-04-25 ENCOUNTER — Other Ambulatory Visit: Payer: Self-pay | Admitting: Internal Medicine

## 2019-04-25 DIAGNOSIS — R002 Palpitations: Secondary | ICD-10-CM

## 2019-04-25 DIAGNOSIS — G90A Postural orthostatic tachycardia syndrome (POTS): Secondary | ICD-10-CM

## 2019-04-25 DIAGNOSIS — I498 Other specified cardiac arrhythmias: Secondary | ICD-10-CM

## 2019-05-16 NOTE — Progress Notes (Signed)
Electrophysiology TeleHealth Note   Due to national recommendations of social distancing due to COVID 19, an audio/video telehealth visit is felt to be most appropriate for this patient at this time.  See MyChart message from today for the patient's consent to telehealth for [facility].   Date:  05/17/2019   ID:  Loflin Dakin, DOB [birth year], MRN [id number]  Location: patient's home  Provider location: [address], [place]  Evaluation Performed: Follow-up visit  PCP:  Jerrol Banana., MD  Cardiologist:     Electrophysiologist:  SK   Chief Complaint:  Dizziness   History of Present Illness:    Maida Widger is a 44 y.o. female who presents via audio/video conferencing for a telehealth visit today.  Since last being seen in our clinic for dizziness and palpitations  the patient reports doing better-- less anxiety.  Less LH -- fatigue better   Event recorder >>HR  avg 76 Min 42-Max 156 There are days when the average HR is > 100 BPM  symtpoms of flutters mostly but not exclusively were assoc with PVCs Symptoms of LH mostly assoc with sinus rhythm w variable rates    The patient denies symptoms of fevers, chills, cough, or new SOB worrisome for COVID 19   Past Medical History:  Diagnosis Date  . Acid reflux 08/16/2014  . ADD (attention deficit disorder)   . Alcohol abuse 05/25/2017  . Anxiety   . Anxiety, generalized 08/16/2014  . 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 Benzodiazepine abuse ([place]) 05/25/2017  . Benzodiazepine overdose 05/25/2017  . Cigarette smoker 08/16/2014  . Depression   . Depression, major, single episode, moderate ([place]) 08/16/2014  . Fatigue 01/20/2017  . Fibromyalgia   . Gastroduodenal ulcer 08/16/2014  . GERD (gastroesophageal reflux disease)   . IBS (irritable bowel syndrome)   . Irregular bleeding 08/16/2014  . Migraine 08/16/2014  . Migraine headache    monthly  .  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    Past Surgical History:  Procedure Laterality Date  . BREAST ENHANCEMENT SURGERY  2007  . COLONOSCOPY    . ESOPHAGOGASTRODUODENOSCOPY     x4  . KNEE ARTHROSCOPY WITH PATELLA RECONSTRUCTION Left 07/19/2017   Procedure: KNEE ARTHROSCOPY PARTIAL MEDIAL MENISECTOMY PATELLA CHONDROPLASTY;  Surgeon: Leim Fabry, MD;  Location: [facility];  Service: Orthopedics;  Laterality: Left;  . SHOULDER ARTHROSCOPY Right     Current Outpatient Medications  Medication Sig Dispense Refill  . amphetamine-dextroamphetamine (ADDERALL) 20 MG tablet Take 1 tablet (20 mg total) by mouth 2 (two) times daily as needed. 60 tablet 0  . nortriptyline (PAMELOR) 25 MG capsule TAKE 1 CAPSULE BY MOUTH EVERY DAY AT BEDTIME 90 capsule 1  . SUMAtriptan (IMITREX) 100 MG tablet Take 1 tablet (100 mg total) by mouth as needed. 10 tablet 11  . buPROPion (WELLBUTRIN XL) 150 MG 24 hr tablet TAKE 1 TABLET BY MOUTH EVERY DAY (Patient not taking: Reported on 03/29/2019) 90 tablet 3  . ketotifen (ZADITOR) 0.025 % ophthalmic solution Place 1 drop into both eyes 2 (two) times daily.    . meloxicam (MOBIC) 15 MG tablet Take 15 mg by mouth daily.    . Omega-3 Fatty Acids (FISH OIL) 1000 MG CPDR Take by mouth.    Marland Kitchen omeprazole (PRILOSEC) 20 MG capsule Take 20 mg by mouth daily.    . predniSONE (STERAPRED UNI-PAK 21 TAB) 10 MG (21) TBPK tablet Taper as directed. (Patient not taking: Reported  on 03/29/2019) 21 tablet 0  . UNABLE TO FIND Place 1 application under the tongue at bedtime. Med Name: CBD oil with Frankincense     . vitamin B-12 (CYANOCOBALAMIN) 1000 MCG tablet Take 1,000 mcg by mouth daily.     No current facility-administered medications for this visit.    Allergies:   Duloxetine, Iodinated diagnostic agents, Meperidine, and Dilaudid [hydromorphone]   Social History:  The patient  reports that she quit smoking about 1 years ago. Her smoking use included cigarettes. She has a 10.00  pack-year smoking history. She has never used smokeless tobacco. She reports current alcohol use of about 4.0 standard drinks of alcohol per week. She reports that she does not use drugs.   Family History:  The patient's   family history includes Esophageal cancer in her maternal grandfather; Osteoporosis in her maternal grandmother.   ROS:  Please see the history of present illness.   All other systems are personally reviewed and negative.    Exam:    Vital Signs:  Ht 5\' 6"  (1.676 m)   Wt 153 lb (69.4 kg)   BMI 24.69 kg/m     Well appearing, alert and conversant, regular work of breathing,  good skin color Eyes- anicteric, neuro- grossly intact, skin- no apparent rash or lesions or cyanosis, mouth- oral mucosa is pink   Labs/Other Tests and Data Reviewed:    Recent Labs: 09/20/2018: ALT 15; BUN 10; Creatinine, Ser 0.78; Hemoglobin 14.1; Platelets 271; Potassium 3.7; Sodium 135   Wt Readings from Last 3 Encounters:  05/17/19 153 lb (69.4 kg)  03/27/19 153 lb 12.8 oz (69.8 kg)  09/20/18 143 lb 15.4 oz (65.3 kg)     Other studies personally reviewed:   Event recorder reviewed  >>  Symptomatic PVCs//sinus variability  ASSESSMENT & PLAN:    Palpitations  Fatigue  Cognitive dysfunction   Anxiety/depression   Alcohol use   Doing better -- less palpitations  Anxiety improved   Less fatigue  Drinking less  variablitiy is also less-- not sure I understand exactly why, but am glad for her sake none theless  COVID 19 screen The patient denies symptoms of COVID 19 at this time.  The importance of social distancing was discussed today.  Follow-up:  72m    Current medicines are reviewed at length with the patient today.   The patient does not have concerns regarding her medicines.  The following changes were made today:  none  Labs/ tests ordered today include:   No orders of the defined types were placed in this encounter.   Future tests ( post COVID )      Patient Risk:  after full review of this patients clinical status, I feel that they are at moderate  risk at this time.  Today, I have spent   minutes with the patient with telehealth technology discussing the above.  Signed, 11m, MD  05/17/2019 4:02 PM     [facility] [address] [place] [phone] (office) [phone] (fax)

## 2019-05-17 ENCOUNTER — Telehealth: Payer: Self-pay

## 2019-05-17 ENCOUNTER — Other Ambulatory Visit: Payer: Self-pay

## 2019-05-17 ENCOUNTER — Telehealth (INDEPENDENT_AMBULATORY_CARE_PROVIDER_SITE_OTHER): Payer: 59 | Admitting: Internal Medicine

## 2019-05-17 VITALS — Ht 66.0 in | Wt 153.0 lb

## 2019-05-17 DIAGNOSIS — R002 Palpitations: Secondary | ICD-10-CM

## 2019-05-17 NOTE — Telephone Encounter (Signed)
  Patient Consent for Virtual Visit         Krystal Marks has provided verbal consent on 05/17/2019 for a virtual visit (video or telephone).   CONSENT FOR VIRTUAL VISIT FOR:  Krystal Marks  By participating in this virtual visit I agree to the following:  I hereby voluntarily request, consent and authorize [facility] and its employed or contracted physicians, physician assistants, nurse practitioners or other licensed health care professionals (the Practitioner), to provide me with telemedicine health care services (the "Services") as deemed necessary by the treating Practitioner. I acknowledge and consent to receive the Services by the Practitioner via telemedicine. I understand that the telemedicine visit will involve communicating with the Practitioner through live audiovisual communication technology and the disclosure of certain medical information by electronic transmission. I acknowledge that I have been given the opportunity to request an in-person assessment or other available alternative prior to the telemedicine visit and am voluntarily participating in the telemedicine visit.  I understand that I have the right to withhold or withdraw my consent to the use of telemedicine in the course of my care at any time, without affecting my right to future care or treatment, and that the Practitioner or I may terminate the telemedicine visit at any time. I understand that I have the right to inspect all information obtained and/or recorded in the course of the telemedicine visit and may receive copies of available information for a reasonable fee.  I understand that some of the potential risks of receiving the Services via telemedicine include:  Marland Kitchen Delay or interruption in medical evaluation due to technological equipment failure or disruption; . Information transmitted may not be sufficient (e.g. poor resolution of images) to allow for appropriate medical decision making by the  Practitioner; and/or  . In rare instances, security protocols could fail, causing a breach of personal health information.  Furthermore, I acknowledge that it is my responsibility to provide information about my medical history, conditions and care that is complete and accurate to the best of my ability. I acknowledge that Practitioner's advice, recommendations, and/or decision may be based on factors not within their control, such as incomplete or inaccurate data provided by me or distortions of diagnostic images or specimens that may result from electronic transmissions. I understand that the practice of medicine is not an exact science and that Practitioner makes no warranties or guarantees regarding treatment outcomes. I acknowledge that a copy of this consent can be made available to me via my patient portal [facility] MyChart), or I can request a printed copy by calling the office of [facility].    I understand that my insurance will be billed for this visit.   I have read or had this consent read to me. . I understand the contents of this consent, which adequately explains the benefits and risks of the Services being provided via telemedicine.  . I have been provided ample opportunity to ask questions regarding this consent and the Services and have had my questions answered to my satisfaction. . I give my informed consent for the services to be provided through the use of telemedicine in my medical care

## 2019-05-17 NOTE — Patient Instructions (Signed)

## 2019-06-08 ENCOUNTER — Other Ambulatory Visit: Payer: Self-pay | Admitting: Family Medicine

## 2019-06-08 DIAGNOSIS — F902 Attention-deficit hyperactivity disorder, combined type: Secondary | ICD-10-CM

## 2019-06-08 NOTE — Telephone Encounter (Signed)
Copied from CRM [id number]. Topic: Quick Communication - Rx Refill/Question >> Jun 08, 2019  1:39 PM Dalphine Handing A wrote: Medication: amphetamine-dextroamphetamine (ADDERALL) 20 MG tablet   Has the patient contacted their pharmacy? {yes (Agent: If no, request that the patient contact the pharmacy for the refill.) (Agent: If yes, when and what did the pharmacy advise?)contact pcp  Preferred Pharmacy (with phone number or street name): [id number] [address], [place]  Phone:  [phone] Fax:  [phone]     Agent: Please be advised that RX refills may take up to 3 business days. We ask that you follow-up with your pharmacy.

## 2019-06-08 NOTE — Telephone Encounter (Signed)
Requested medication (s) are due for refill today: yes  Requested medication (s) are on the active medication list:  yes  Last refill:  03/29/2019  Future visit scheduled: no  Notes to clinic: this refill cannot be delegated    Requested Prescriptions  Pending Prescriptions Disp Refills   amphetamine-dextroamphetamine (ADDERALL) 20 MG tablet 60 tablet 0    Sig: Take 1 tablet (20 mg total) by mouth 2 (two) times daily as needed.      Not Delegated - Psychiatry:  Stimulants/ADHD Failed - 06/08/2019  1:43 PM      Failed - This refill cannot be delegated      Failed - Urine Drug Screen completed in last 360 days.      Passed - Valid encounter within last 3 months    Recent Outpatient Visits           2 months ago Fatigue, unspecified type   [facility] Maple Hudson., MD   2 months ago    [facility] Maple Hudson., MD   5 months ago Attention deficit hyperactivity disorder (ADHD), combined type   [facility] Maple Hudson., MD   1 year ago Right wrist pain   [facility] Maple Hudson., MD   1 year ago Intractable migraine without status migrainosus, unspecified migraine type   [facility] Maple Hudson., MD

## 2019-06-11 ENCOUNTER — Other Ambulatory Visit: Payer: Self-pay | Admitting: Family Medicine

## 2019-06-11 DIAGNOSIS — F902 Attention-deficit hyperactivity disorder, combined type: Secondary | ICD-10-CM

## 2019-06-11 MED ORDER — AMPHETAMINE-DEXTROAMPHETAMINE 20 MG PO TABS: 20.0000 mg | ORAL_TABLET | Freq: Two times a day (BID) | ORAL | 0 refills | Status: DC | PRN

## 2019-06-11 NOTE — Telephone Encounter (Signed)
Please advise 

## 2019-07-11 IMAGING — CR DG ABDOMEN 2V
1 series · 3 of 3 positions shown · non-contrast
Comparison: CT 12/20/2014

ADDENDUM:
Speech recognition error in the body of the report. Third sentence
should read " no abnormal calcifications or bony findings. "
CLINICAL DATA: Recent onset of constipation and abdominal bloating.

EXAM:
ABDOMEN - 2 VIEW

[Series 1: dg abd 2 views · 0.14mm/px · 3 of 3 slices shown]
[im 1/3]
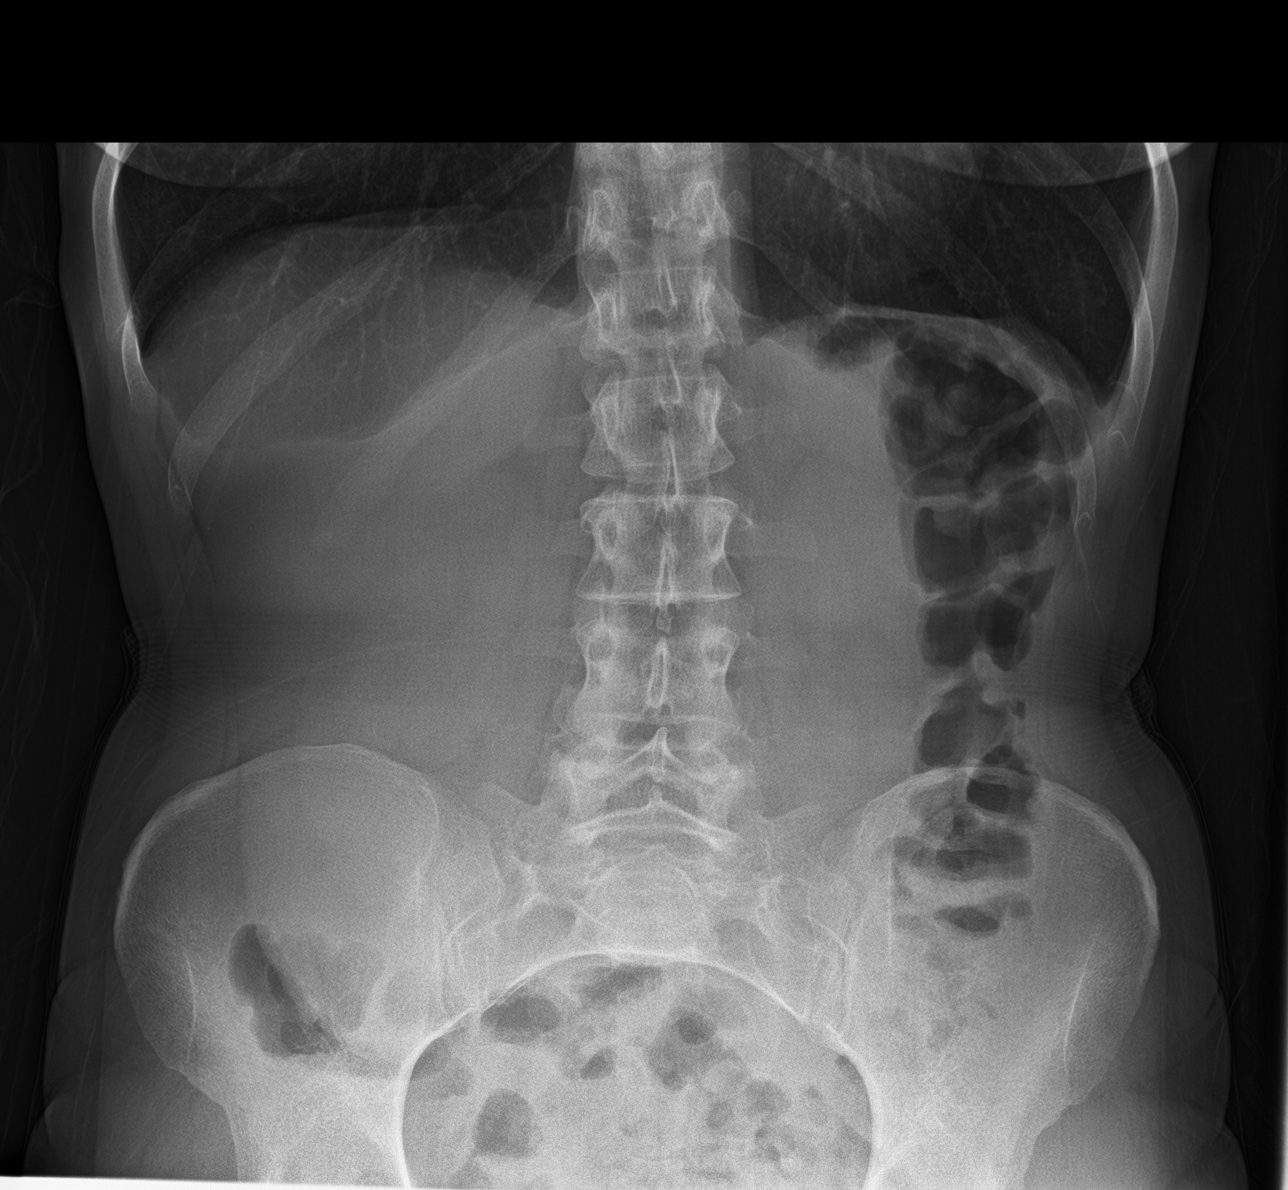
[im 2/3]
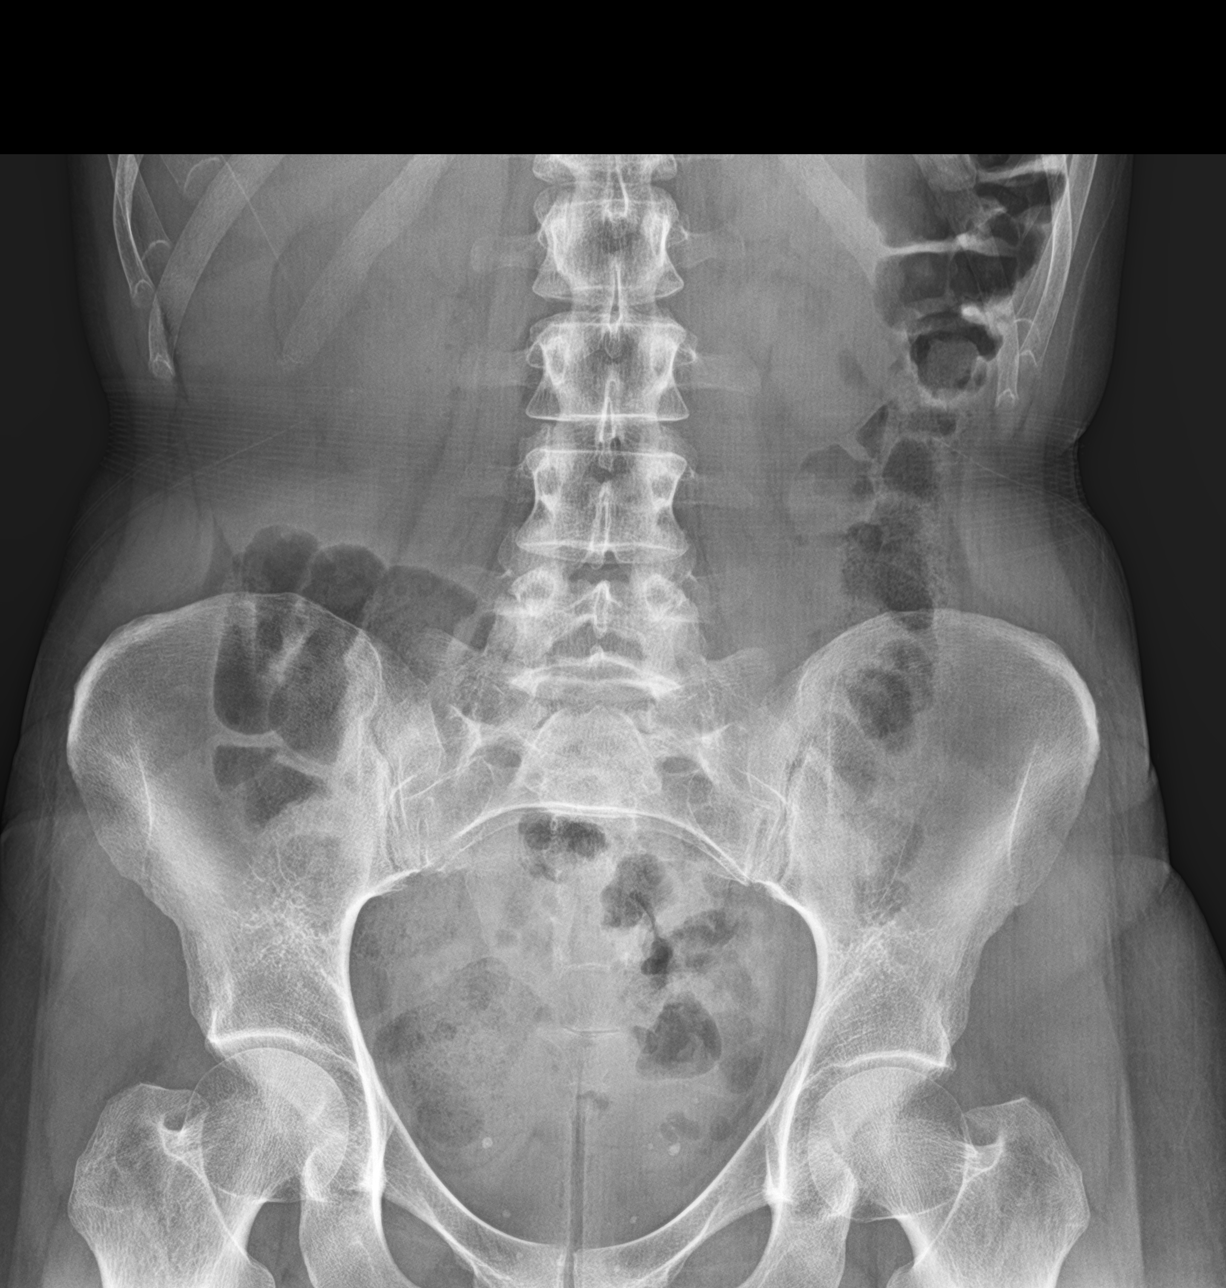
[im 3/3]
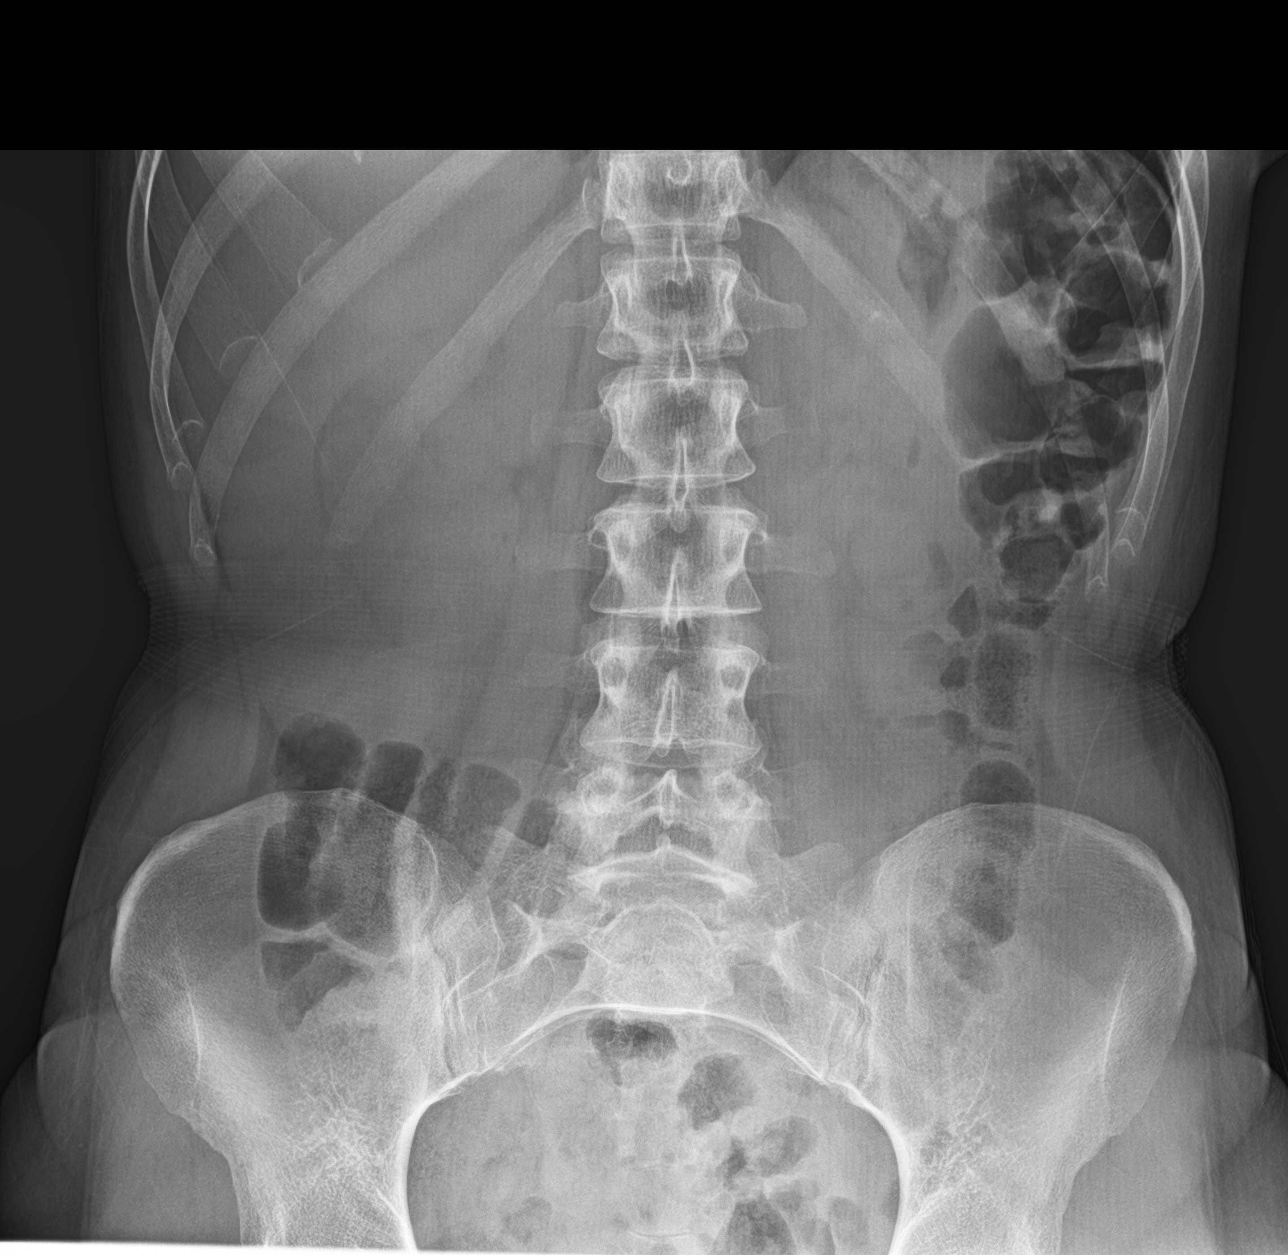

[3 of 3 positions shown; findings below may reference images not displayed]

FINDINGS: Bowel gas pattern is normal without evidence of ileus, obstruction
or free air. Amount of fecal matter appears within normal limits.
Abnormal calcifications or bony findings. Insignificant phleboliths
pelvis.
IMPRESSION: Abdominal radiographs within normal limits. Bowel gas pattern
appears normal. There does not appear to be a grossly increased
amount of fecal matter.

## 2019-07-11 NOTE — Progress Notes (Signed)
Established patient visit   Patient: Krystal Marks   DOB: 1975-08-23   44 y.o. Female  MRN: [id number] Visit Date: 07/12/2019  I,[name] [name],acting as a scribe for Megan Mans, MD.,have documented all relevant documentation on the behalf of Megan Mans, MD,as directed by  Megan Mans, MD while in the presence of Megan Mans, MD.  Today's healthcare provider: Megan Mans, MD   Chief Complaint  Patient presents with  . ADHD  . 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 She is now taking taking dextromethorphan for her fibromyalgia and has  found this is helped Follow up for ADHD  The patient was last seen for this 3 months ago. Changes made at last visit include no changes.  She reports excellent compliance with treatment. She feels that condition is Improved. She is not having side effects.   ----------------------------------------------------------------------------------------- Patient reports starting Mucinex DM 1200 mg BID for fibromyalgia.    Patient Active Problem List   Diagnosis Date Noted  . Benzodiazepine abuse ([facility]) 05/25/2017  . Alcohol abuse 05/25/2017  . Benzodiazepine overdose 05/25/2017  . 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  . 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 Fatigue 01/20/2017  . Fibromyalgia 01/12/2016  . ADD (attention deficit disorder) 08/16/2014  . Cigarette smoker 08/16/2014  . Depression, major, single episode, moderate ([facility]) 08/16/2014  . Anxiety, generalized 08/16/2014  . Acid reflux 08/16/2014  . Irregular bleeding 08/16/2014  . Cannot sleep 08/16/2014  . Migraine 08/16/2014  . Gastroduodenal ulcer 08/16/2014  . Anxiety 06/18/2014  . NONSPECIFIC ABNORM RESULTS OTH SPEC FUNCT STUDY 09/28/2007   Social History   Tobacco Use  . Smoking status: Current Every Day Smoker    Packs/day: 0.50     Years: 20.00    Pack years: 10.00    Types: Cigarettes    Last attempt to quit: 06/03/2017    Years since quitting: 2.1  . Smokeless tobacco: Never Used  . Tobacco comment:    Vaping Use  . Vaping Use: Never used  Substance Use Topics  . Alcohol use: Yes    Alcohol/week: 4.0 standard drinks    Types: 4 Glasses of wine per week  . Drug use: No       Medications: Outpatient Medications Prior to Visit  Medication Sig  . amphetamine-dextroamphetamine (ADDERALL) 20 MG tablet Take 1 tablet (20 mg total) by mouth 2 (two) times daily as needed.  Marland Kitchen ELDERBERRY PO Take by mouth.  . nortriptyline (PAMELOR) 25 MG capsule TAKE 1 CAPSULE BY MOUTH EVERY DAY AT BEDTIME  . Pseudoephedrine-guaiFENesin (MUCINEX D MAX STRENGTH PO) Take 1,200 mg by mouth in the morning and at bedtime.  . SUMAtriptan (IMITREX) 100 MG tablet Take 1 tablet (100 mg total) by mouth as needed.  . vitamin B-12 (CYANOCOBALAMIN) 1000 MCG tablet Take 1,000 mcg by mouth daily.  . [DISCONTINUED] buPROPion (WELLBUTRIN XL) 150 MG 24 hr tablet TAKE 1 TABLET BY MOUTH EVERY DAY (Patient not taking: Reported on 03/29/2019)  . [DISCONTINUED] ketotifen (ZADITOR) 0.025 % ophthalmic solution Place 1 drop into both eyes 2 (two) times daily.  . [DISCONTINUED] meloxicam (MOBIC) 15 MG tablet Take 15 mg by mouth daily.  . [DISCONTINUED] Omega-3 Fatty Acids (FISH OIL) 1000 MG CPDR Take by mouth.  . [DISCONTINUED] omeprazole (PRILOSEC) 20 MG capsule Take 20 mg by mouth daily.  . [DISCONTINUED] predniSONE (STERAPRED UNI-PAK 21 TAB) 10  MG (21) TBPK tablet Taper as directed. (Patient not taking: Reported on 03/29/2019)  . [DISCONTINUED] UNABLE TO FIND Place 1 application under the tongue at bedtime. Med Name: CBD oil with Frankincense    No facility-administered medications prior to visit.    Review of Systems  Constitutional: Negative.   Respiratory: Negative.   Cardiovascular: Negative.   Gastrointestinal: Negative.   Genitourinary: Negative.     Musculoskeletal: Negative.   Allergic/Immunologic: Negative.   Neurological: Negative.   Psychiatric/Behavioral: Negative.     Last metabolic panel Lab Results  Component Value Date   GLUCOSE 105 (H) 09/20/2018   NA 135 09/20/2018   K 3.7 09/20/2018   CL 103 09/20/2018   CO2 22 09/20/2018   BUN 10 09/20/2018   CREATININE 0.78 09/20/2018   GFRNONAA >60 09/20/2018   GFRAA >60 09/20/2018   CALCIUM 9.1 09/20/2018   PROT 7.6 09/20/2018   ALBUMIN 4.4 09/20/2018   LABGLOB 2.7 01/12/2017   AGRATIO 1.6 01/12/2017   BILITOT 1.5 (H) 09/20/2018   ALKPHOS 53 09/20/2018   AST 18 09/20/2018   ALT 15 09/20/2018   ANIONGAP 10 09/20/2018   Last thyroid functions Lab Results  Component Value Date   TSH 1.170 01/12/2017      Objective    BP 99/64 (BP Location: Right Arm, Patient Position: Sitting, Cuff Size: Normal)   Pulse 71   Temp (!) 97.5 F (36.4 C) (Temporal)   Resp 16   Wt 160 lb (72.6 kg)   BMI 25.82 kg/m  BP Readings from Last 3 Encounters:  07/12/19 99/64  03/27/19 105/64  09/20/18 111/66   Wt Readings from Last 3 Encounters:  07/12/19 160 lb (72.6 kg)  05/17/19 153 lb (69.4 kg)  03/27/19 153 lb 12.8 oz (69.8 kg)      Physical Exam Vitals reviewed.  Constitutional:      General: She is not in acute distress.    Appearance: Normal appearance. She is well-developed. She is not diaphoretic.  HENT:     Head: Normocephalic and atraumatic.  Eyes:     General: No scleral icterus.    Conjunctiva/sclera: Conjunctivae normal.  Neck:     Thyroid: No thyromegaly.  Cardiovascular:     Rate and Rhythm: Normal rate and regular rhythm.     Pulses: Normal pulses.     Heart sounds: Normal heart sounds. No murmur heard.   Pulmonary:     Effort: Pulmonary effort is normal. No respiratory distress.     Breath sounds: Normal breath sounds. No wheezing, rhonchi or rales.  Abdominal:     General: Abdomen is flat.     Palpations: Abdomen is soft.  Musculoskeletal:      Cervical back: Neck supple.     Right lower leg: No edema.     Left lower leg: No edema.  Lymphadenopathy:     Cervical: No cervical adenopathy.  Skin:    General: Skin is warm and dry.     Findings: No rash.  Neurological:     Mental Status: She is alert and oriented to person, place, and time. Mental status is at baseline.  Psychiatric:        Mood and Affect: Mood normal.        Behavior: Behavior normal.      No results found for any visits on 07/12/19.  Assessment & Plan     Problem List Items Addressed This Visit      Other   ADD (attention deficit disorder) -  Primary    Stable Continue current medication  Follow up in December for CPE        Problem List Items Addressed This Visit      Digestive   Acid reflux     Other   ADD (attention deficit disorder) - Primary    Stable Continue current medication  Follow up in December for CPE      Anxiety, generalized   Fibromyalgia    Now controlled with dextromethorphan twice a day.       Other Visit Diagnoses    History of IBS            Return in about 6 months (around 01/12/2020) for CPE.      I, Megan Mans, MD, have reviewed all documentation for this visit. The documentation on 07/14/19 for the exam, diagnosis, procedures, and orders are all accurate and complete.    Richard Wendelyn Breslow, MD  [facility] [phone] (phone) [phone] (fax)  [facility]

## 2019-07-12 ENCOUNTER — Encounter: Payer: Self-pay | Admitting: Family Medicine

## 2019-07-12 ENCOUNTER — Other Ambulatory Visit: Payer: Self-pay

## 2019-07-12 ENCOUNTER — Ambulatory Visit (INDEPENDENT_AMBULATORY_CARE_PROVIDER_SITE_OTHER): Payer: 59 | Admitting: Family Medicine

## 2019-07-12 VITALS — BP 99/64 | HR 71 | Temp 97.5°F | Resp 16 | Wt 160.0 lb

## 2019-07-12 DIAGNOSIS — F902 Attention-deficit hyperactivity disorder, combined type: Secondary | ICD-10-CM

## 2019-07-12 DIAGNOSIS — F411 Generalized anxiety disorder: Secondary | ICD-10-CM

## 2019-07-12 DIAGNOSIS — M797 Fibromyalgia: Secondary | ICD-10-CM

## 2019-07-12 DIAGNOSIS — K219 Gastro-esophageal reflux disease without esophagitis: Secondary | ICD-10-CM

## 2019-07-12 DIAGNOSIS — Z8719 Personal history of other diseases of the digestive system: Secondary | ICD-10-CM

## 2019-07-12 NOTE — Assessment & Plan Note (Signed)
Stable Continue current medication  Follow up in December for CPE

## 2019-07-13 ENCOUNTER — Other Ambulatory Visit: Payer: Self-pay

## 2019-07-13 DIAGNOSIS — F902 Attention-deficit hyperactivity disorder, combined type: Secondary | ICD-10-CM

## 2019-07-13 MED ORDER — AMPHETAMINE-DEXTROAMPHETAMINE 20 MG PO TABS: 20.0000 mg | ORAL_TABLET | Freq: Two times a day (BID) | ORAL | 0 refills | Status: DC | PRN

## 2019-07-13 NOTE — Telephone Encounter (Signed)
Please advise? Dr. Sullivan Lone is out of office and was supposed to sent this in for patient after her appointment yesterday.

## 2019-07-13 NOTE — Telephone Encounter (Signed)
Thank Atiya !  I will sign and send script

## 2019-07-14 MED ORDER — AMPHETAMINE-DEXTROAMPHETAMINE 20 MG PO TABS: 20.0000 mg | ORAL_TABLET | Freq: Two times a day (BID) | ORAL | 0 refills | Status: DC

## 2019-07-14 NOTE — Assessment & Plan Note (Signed)
Now controlled with dextromethorphan twice a day.

## 2019-09-18 ENCOUNTER — Other Ambulatory Visit: Payer: Self-pay | Admitting: Family Medicine

## 2019-09-18 NOTE — Telephone Encounter (Signed)
Requested medication (s) are due for refill today:yes  Requested medication (s) are on the active medication list:yes  Last refill: 07/14/19  #60 0  refills  Future visit scheduled  yes 10/02/19  Notes to clinic:not delegated  Requested Prescriptions  Pending Prescriptions Disp Refills   amphetamine-dextroamphetamine (ADDERALL) 20 MG tablet 60 tablet 0    Sig: Take 1 tablet (20 mg total) by mouth 2 (two) times daily.      Not Delegated - Psychiatry:  Stimulants/ADHD Failed - 09/18/2019 10:45 AM      Failed - This refill cannot be delegated      Failed - Urine Drug Screen completed in last 360 days.      Passed - Valid encounter within last 3 months    Recent Outpatient Visits           2 months ago Attention deficit hyperactivity disorder (ADHD), combined type   [facility] Maple Hudson., MD   5 months ago Fatigue, unspecified type   [facility] Maple Hudson., MD   5 months ago    [facility] Sullivan Lone, Leonette Monarch., MD   9 months ago Attention deficit hyperactivity disorder (ADHD), combined type   [facility] Maple Hudson., MD   1 year ago Right wrist pain   [facility] Maple Hudson., MD       Future Appointments             In 2 weeks Maple Hudson., MD [facility]   In 2 months Maple Hudson., MD [facility]

## 2019-09-19 NOTE — Telephone Encounter (Signed)
Please advise 

## 2019-09-21 MED ORDER — AMPHETAMINE-DEXTROAMPHETAMINE 20 MG PO TABS: 20.0000 mg | ORAL_TABLET | Freq: Two times a day (BID) | ORAL | 0 refills | Status: DC

## 2019-09-26 ENCOUNTER — Encounter: Payer: Self-pay | Admitting: Physician Assistant

## 2019-09-26 ENCOUNTER — Ambulatory Visit: Payer: 59 | Admitting: Physician Assistant

## 2019-09-26 ENCOUNTER — Other Ambulatory Visit: Payer: Self-pay

## 2019-09-26 VITALS — BP 98/61 | HR 73 | Temp 98.6°F | Resp 16 | Wt 158.6 lb

## 2019-09-26 DIAGNOSIS — N926 Irregular menstruation, unspecified: Secondary | ICD-10-CM | POA: Diagnosis not present

## 2019-09-26 DIAGNOSIS — R140 Abdominal distension (gaseous): Secondary | ICD-10-CM

## 2019-09-26 DIAGNOSIS — N644 Mastodynia: Secondary | ICD-10-CM

## 2019-09-26 DIAGNOSIS — R110 Nausea: Secondary | ICD-10-CM | POA: Diagnosis not present

## 2019-09-26 NOTE — Progress Notes (Signed)
Established patient visit   Patient: Krystal Marks   DOB: 10/16/1975   44 y.o. Female  MRN: [id number] Visit Date: 09/26/2019  Today's healthcare provider: Margaretann Loveless, PA-C   Chief Complaint  Patient presents with  . Possible Pregnancy   Subjective    HPI  Patient here today with c/o possible pregnancy.Last LMP 08/13/19. She has taken several test and have been negative. Reports that her breast are sore, she feels fatigue. She has light cramping and she started spotting today. Reports that she had a miscarriage 2002. Reports that she has been having pregnancy symptoms, like constant bloating.  Patient Active Problem List   Diagnosis Date Noted  . Benzodiazepine abuse (HCC) 05/25/2017  . Alcohol abuse 05/25/2017  . Benzodiazepine overdose 05/25/2017  . 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  . 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 Fatigue 01/20/2017  . Fibromyalgia 01/12/2016  . ADD (attention deficit disorder) 08/16/2014  . Cigarette smoker 08/16/2014  . Depression, major, single episode, moderate (HCC) 08/16/2014  . Anxiety, generalized 08/16/2014  . Acid reflux 08/16/2014  . Irregular bleeding 08/16/2014  . Cannot sleep 08/16/2014  . Migraine 08/16/2014  . Gastroduodenal ulcer 08/16/2014  . Anxiety 06/18/2014  . NONSPECIFIC ABNORM RESULTS OTH SPEC FUNCT STUDY 09/28/2007   Past Medical History:  Diagnosis Date  . Acid reflux 08/16/2014  . ADD (attention deficit disorder)   . Alcohol abuse 05/25/2017  . Anxiety   . Anxiety, generalized 08/16/2014  . 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 Benzodiazepine abuse (HCC) 05/25/2017  . Benzodiazepine overdose 05/25/2017  . Cigarette smoker 08/16/2014  . Depression   . Depression, major, single episode, moderate (HCC) 08/16/2014  . Fatigue 01/20/2017  . Fibromyalgia   . Gastroduodenal ulcer 08/16/2014  . GERD  (gastroesophageal reflux disease)   . IBS (irritable bowel syndrome)   . Irregular bleeding 08/16/2014  . Migraine 08/16/2014  . Migraine headache    monthly  . 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       Medications: Outpatient Medications Prior to Visit  Medication Sig  . amphetamine-dextroamphetamine (ADDERALL) 20 MG tablet Take 1 tablet (20 mg total) by mouth 2 (two) times daily.  Marland Kitchen amphetamine-dextroamphetamine (ADDERALL) 20 MG tablet Take 1 tablet (20 mg total) by mouth 2 (two) times daily as needed.  Marland Kitchen amphetamine-dextroamphetamine (ADDERALL) 20 MG tablet Take 1 tablet (20 mg total) by mouth 2 (two) times daily.  . Cholecalciferol (VITAMIN D3 PO) Take by mouth in the morning and at bedtime.  Marland Kitchen ELDERBERRY PO Take by mouth.  . folic acid (FOLVITE) 400 MCG tablet Take 400 mcg by mouth daily.  . Levocetirizine Dihydrochloride (XYZAL PO) Take by mouth daily.  . nortriptyline (PAMELOR) 25 MG capsule TAKE 1 CAPSULE BY MOUTH EVERY DAY AT BEDTIME  . OVER THE COUNTER MEDICATION Slee Aid gummy-OLLY (magnesium,zinc,Melatonin)  . Pseudoephedrine-guaiFENesin (MUCINEX D MAX STRENGTH PO) Take 1,200 mg by mouth in the morning and at bedtime.  . SUMAtriptan (IMITREX) 100 MG tablet Take 1 tablet (100 mg total) by mouth as needed.  . vitamin B-12 (CYANOCOBALAMIN) 1000 MCG tablet Take 1,000 mcg by mouth daily. OLLY-brand   No facility-administered medications prior to visit.    Review of Systems  Constitutional: Positive for fatigue.  Respiratory: Negative.   Cardiovascular: Negative.   Gastrointestinal: Positive for abdominal distention and nausea.  Genitourinary: Positive for menstrual problem and pelvic pain.    Last CBC Lab Results  Component Value Date  WBC 10.1 09/26/2019   HGB 13.0 09/26/2019   HCT 37.3 09/26/2019   MCV 94 09/26/2019   MCH 32.6 09/26/2019   RDW 11.4 (L) 09/26/2019   PLT 308 09/26/2019   Last metabolic panel Lab Results  Component Value Date   GLUCOSE 83  09/26/2019   NA 138 09/26/2019   K 4.6 09/26/2019   CL 101 09/26/2019   CO2 22 09/26/2019   BUN 11 09/26/2019   CREATININE 0.67 09/26/2019   GFRNONAA 107 09/26/2019   GFRAA 124 09/26/2019   CALCIUM 8.7 09/26/2019   PROT 7.6 09/20/2018   ALBUMIN 4.4 09/20/2018   LABGLOB 2.7 01/12/2017   AGRATIO 1.6 01/12/2017   BILITOT 1.5 (H) 09/20/2018   ALKPHOS 53 09/20/2018   AST 18 09/20/2018   ALT 15 09/20/2018   ANIONGAP 10 09/20/2018      Objective    BP 98/61 (BP Location: Left Arm, Patient Position: Sitting, Cuff Size: Large)   Pulse 73   Temp 98.6 F (37 C) (Oral)   Resp 16   Wt 158 lb 9.6 oz (71.9 kg)   LMP 08/13/2019   BMI 25.60 kg/m  BP Readings from Last 3 Encounters:  09/26/19 98/61  07/12/19 99/64  03/27/19 105/64   Wt Readings from Last 3 Encounters:  09/26/19 158 lb 9.6 oz (71.9 kg)  07/12/19 160 lb (72.6 kg)  05/17/19 153 lb (69.4 kg)      Physical Exam Vitals reviewed.  Constitutional:      General: She is not in acute distress.    Appearance: Normal appearance. She is well-developed, well-groomed and normal weight. She is not ill-appearing or diaphoretic.  Cardiovascular:     Rate and Rhythm: Normal rate and regular rhythm.     Heart sounds: Normal heart sounds. No murmur heard.  No friction rub. No gallop.   Pulmonary:     Effort: Pulmonary effort is normal. No respiratory distress.     Breath sounds: Normal breath sounds. No wheezing or rales.  Abdominal:     General: Abdomen is flat. Bowel sounds are normal. There is no distension.     Palpations: Abdomen is soft. There is no mass.     Tenderness: There is generalized abdominal tenderness. There is no guarding or rebound.  Skin:    General: Skin is warm and dry.  Neurological:     Mental Status: She is alert and oriented to person, place, and time.  Psychiatric:        Behavior: Behavior is cooperative.       No results found for any visits on 09/26/19.  Assessment & Plan     1. Missed  menses Will check labs as below and f/u pending results. If labs normal, suspect changes in menstrual cycles that are consistent with being in a perimenopausal state, where she may be having menopausal symptoms with still having menstrual cycles.  - Beta HCG, Quant - CBC w/Diff/Platelet - Basic Metabolic Panel (BMET)  2. Nausea Will check labs as below and f/u pending results. - Beta HCG, Quant - CBC w/Diff/Platelet - Basic Metabolic Panel (BMET) - Lipase  3. Bloating Will check labs as below and f/u pending results. - Beta HCG, Quant - CBC w/Diff/Platelet - Basic Metabolic Panel (BMET) - Lipase  4. Breast tenderness Will check labs as below and f/u pending results. - Beta HCG, Quant - CBC w/Diff/Platelet - Basic Metabolic Panel (BMET)   No follow-ups on file.      I, Margaretann Loveless,  PA-C, have reviewed all documentation for this visit. The documentation on 09/27/19 for the exam, diagnosis, procedures, and orders are all accurate and complete.   Reine Just  [facility] [phone] (phone) [phone] (fax)  [facility]

## 2019-09-27 ENCOUNTER — Encounter: Payer: Self-pay | Admitting: Physician Assistant

## 2019-09-27 ENCOUNTER — Telehealth: Payer: Self-pay

## 2019-09-27 LAB — CBC WITH DIFFERENTIAL/PLATELET
Basophils Absolute: 0.1 10*3/uL (ref 0.0–0.2)
Basos: 1 %
EOS (ABSOLUTE): 0 10*3/uL (ref 0.0–0.4)
Eos: 0 %
Hematocrit: 37.3 % (ref 34.0–46.6)
Hemoglobin: 13 g/dL (ref 11.1–15.9)
Immature Grans (Abs): 0 10*3/uL (ref 0.0–0.1)
Immature Granulocytes: 0 %
Lymphocytes Absolute: 2.1 10*3/uL (ref 0.7–3.1)
Lymphs: 21 %
MCH: 32.6 pg (ref 26.6–33.0)
MCHC: 34.9 g/dL (ref 31.5–35.7)
MCV: 94 fL (ref 79–97)
Monocytes Absolute: 0.7 10*3/uL (ref 0.1–0.9)
Monocytes: 7 %
Neutrophils Absolute: 7.1 10*3/uL — ABNORMAL HIGH (ref 1.4–7.0)
Neutrophils: 71 %
Platelets: 308 10*3/uL (ref 150–450)
RBC: 3.99 x10E6/uL (ref 3.77–5.28)
RDW: 11.4 % — ABNORMAL LOW (ref 11.7–15.4)
WBC: 10.1 10*3/uL (ref 3.4–10.8)

## 2019-09-27 LAB — BASIC METABOLIC PANEL
BUN/Creatinine Ratio: 16 (ref 9–23)
BUN: 11 mg/dL (ref 6–24)
CO2: 22 mmol/L (ref 20–29)
Calcium: 8.7 mg/dL (ref 8.7–10.2)
Chloride: 101 mmol/L (ref 96–106)
Creatinine, Ser: 0.67 mg/dL (ref 0.57–1.00)
GFR calc Af Amer: 124 mL/min/{1.73_m2} (ref >59)
GFR calc non Af Amer: 107 mL/min/{1.73_m2} (ref >59)
Glucose: 83 mg/dL (ref 65–99)
Potassium: 4.6 mmol/L (ref 3.5–5.2)
Sodium: 138 mmol/L (ref 134–144)

## 2019-09-27 LAB — LIPASE: Lipase: 19 U/L (ref 14–72)

## 2019-09-27 LAB — BETA HCG QUANT (REF LAB): hCG Quant: <1 m[IU]/mL

## 2019-09-27 NOTE — Telephone Encounter (Signed)
Status: Final result       Visible to patient: Yes (seen)

## 2019-09-27 NOTE — Telephone Encounter (Signed)
-----   Message from Margaretann Loveless, [place] sent at 09/27/2019 12:21 PM EDT ----- Beta HCG is negative so no pregnancy or miscarriage. Blood count is normal. Kidney function is normal. Sugar is normal. Sodium, potassium, and calcium are normal. Lipase (pancreatic enzyme) is normal.

## 2019-10-02 ENCOUNTER — Ambulatory Visit: Payer: 59 | Admitting: Family Medicine

## 2019-10-02 NOTE — Progress Notes (Deleted)
     Established patient visit   Patient: Krystal Marks   DOB: November 19, 1975   44 y.o. Female  MRN: [id number] Visit Date: 10/02/2019  Today's healthcare provider: Megan Mans, MD   No chief complaint on file.  Subjective    HPI  ADD From 07/12/2019-on Adderall 20 mg qd.  {Show patient history (optional):23778::" "}   Medications: Outpatient Medications Prior to Visit  Medication Sig  . amphetamine-dextroamphetamine (ADDERALL) 20 MG tablet Take 1 tablet (20 mg total) by mouth 2 (two) times daily.  Marland Kitchen amphetamine-dextroamphetamine (ADDERALL) 20 MG tablet Take 1 tablet (20 mg total) by mouth 2 (two) times daily as needed.  Marland Kitchen amphetamine-dextroamphetamine (ADDERALL) 20 MG tablet Take 1 tablet (20 mg total) by mouth 2 (two) times daily.  . Cholecalciferol (VITAMIN D3 PO) Take by mouth in the morning and at bedtime.  Marland Kitchen ELDERBERRY PO Take by mouth.  . folic acid (FOLVITE) 400 MCG tablet Take 400 mcg by mouth daily.  . Levocetirizine Dihydrochloride (XYZAL PO) Take by mouth daily.  . nortriptyline (PAMELOR) 25 MG capsule TAKE 1 CAPSULE BY MOUTH EVERY DAY AT BEDTIME  . OVER THE COUNTER MEDICATION Slee Aid gummy-OLLY (magnesium,zinc,Melatonin)  . Pseudoephedrine-guaiFENesin (MUCINEX D MAX STRENGTH PO) Take 1,200 mg by mouth in the morning and at bedtime.  . SUMAtriptan (IMITREX) 100 MG tablet Take 1 tablet (100 mg total) by mouth as needed.  . vitamin B-12 (CYANOCOBALAMIN) 1000 MCG tablet Take 1,000 mcg by mouth daily. OLLY-brand   No facility-administered medications prior to visit.    Review of Systems  Constitutional: Negative for appetite change, chills, fatigue and fever.  Respiratory: Negative for chest tightness and shortness of breath.   Cardiovascular: Negative for chest pain and palpitations.  Gastrointestinal: Negative for abdominal pain, nausea and vomiting.  Neurological: Negative for dizziness and weakness.    {Heme  Chem  Endocrine  Serology  Results  Review (optional):23779::" "}  Objective    There were no vitals taken for this visit. {Show previous vital signs (optional):23777::" "}  Physical Exam  ***  No results found for any visits on 10/02/19.  Assessment & Plan     ***  No follow-ups on file.      {provider attestation***:1}   Megan Mans, MD  [facility] [phone] (phone) [phone] (fax)  [facility]

## 2019-10-10 ENCOUNTER — Ambulatory Visit: Payer: 59 | Admitting: Family Medicine

## 2019-10-10 NOTE — Progress Notes (Deleted)
     Established patient visit   Patient: Krystal Marks   DOB: 05-Mar-1975   44 y.o. Female  MRN: [id number] Visit Date: 10/10/2019  Today's healthcare provider: Megan Mans, MD   No chief complaint on file.  Subjective    HPI  ADD From 07/12/2019-Stable. Continue current medication. Follow up in December for CPE.  Anxiety, Follow-up  She was last seen for anxiety 3 months ago. Changes made at last visit include; Now controlled with dextromethorphan twice.   She reports {excellent/good/fair/poor:19665} compliance with treatment. She reports {good/fair/poor:18685} tolerance of treatment. She {is/is not:21021397} having side effects. {document side effects if present:1}  She feels her anxiety is {Desc; severity:60313} and {improved/worse/unchanged:3041574} since last visit.  Symptoms: {Yes/No:20286} chest pain {Yes/No:20286} difficulty concentrating  {Yes/No:20286} dizziness {Yes/No:20286} fatigue  {Yes/No:20286} feelings of losing control {Yes/No:20286} insomnia  {Yes/No:20286} irritable {Yes/No:20286} palpitations  {Yes/No:20286} panic attacks {Yes/No:20286} racing thoughts  {Yes/No:20286} shortness of breath {Yes/No:20286} sweating  {Yes/No:20286} tremors/shakes    GAD-7 Results No flowsheet data found.  PHQ-9 Scores PHQ9 SCORE ONLY 07/12/2019 01/20/2017 01/12/2017  PHQ-9 Total Score 0 5 2    ---------------------------------------------------------------------------------------------------   {Show patient history (optional):23778::" "}   Medications: Outpatient Medications Prior to Visit  Medication Sig  . amphetamine-dextroamphetamine (ADDERALL) 20 MG tablet Take 1 tablet (20 mg total) by mouth 2 (two) times daily.  Marland Kitchen amphetamine-dextroamphetamine (ADDERALL) 20 MG tablet Take 1 tablet (20 mg total) by mouth 2 (two) times daily as needed.  Marland Kitchen amphetamine-dextroamphetamine (ADDERALL) 20 MG tablet Take 1 tablet (20 mg total) by mouth 2 (two) times daily.  .  Cholecalciferol (VITAMIN D3 PO) Take by mouth in the morning and at bedtime.  Marland Kitchen ELDERBERRY PO Take by mouth.  . folic acid (FOLVITE) 400 MCG tablet Take 400 mcg by mouth daily.  . Levocetirizine Dihydrochloride (XYZAL PO) Take by mouth daily.  . nortriptyline (PAMELOR) 25 MG capsule TAKE 1 CAPSULE BY MOUTH EVERY DAY AT BEDTIME  . OVER THE COUNTER MEDICATION Slee Aid gummy-OLLY (magnesium,zinc,Melatonin)  . Pseudoephedrine-guaiFENesin (MUCINEX D MAX STRENGTH PO) Take 1,200 mg by mouth in the morning and at bedtime.  . SUMAtriptan (IMITREX) 100 MG tablet Take 1 tablet (100 mg total) by mouth as needed.  . vitamin B-12 (CYANOCOBALAMIN) 1000 MCG tablet Take 1,000 mcg by mouth daily. OLLY-brand   No facility-administered medications prior to visit.    Review of Systems  Constitutional: Negative for appetite change, chills, fatigue and fever.  Respiratory: Negative for chest tightness and shortness of breath.   Cardiovascular: Negative for chest pain and palpitations.  Gastrointestinal: Negative for abdominal pain, nausea and vomiting.  Neurological: Negative for dizziness and weakness.    {Heme  Chem  Endocrine  Serology  Results Review (optional):23779::" "}  Objective    There were no vitals taken for this visit. {Show previous vital signs (optional):23777::" "}  Physical Exam  ***  No results found for any visits on 10/10/19.  Assessment & Plan     ***  No follow-ups on file.      {provider attestation***:1}   Megan Mans, MD  [facility] [phone] (phone) [phone] (fax)  [facility]

## 2019-10-24 ENCOUNTER — Other Ambulatory Visit: Payer: Self-pay

## 2019-10-24 ENCOUNTER — Encounter: Payer: Self-pay | Admitting: Family Medicine

## 2019-10-24 ENCOUNTER — Ambulatory Visit: Payer: 59 | Admitting: Family Medicine

## 2019-10-24 VITALS — BP 105/54 | HR 73 | Temp 98.6°F | Resp 16 | Ht 66.0 in | Wt 161.0 lb

## 2019-10-24 DIAGNOSIS — M797 Fibromyalgia: Secondary | ICD-10-CM | POA: Diagnosis not present

## 2019-10-24 DIAGNOSIS — F902 Attention-deficit hyperactivity disorder, combined type: Secondary | ICD-10-CM

## 2019-10-24 DIAGNOSIS — F411 Generalized anxiety disorder: Secondary | ICD-10-CM

## 2019-10-24 DIAGNOSIS — K219 Gastro-esophageal reflux disease without esophagitis: Secondary | ICD-10-CM | POA: Diagnosis not present

## 2019-10-24 MED ORDER — AMPHETAMINE-DEXTROAMPHETAMINE 20 MG PO TABS: 20.0000 mg | ORAL_TABLET | Freq: Two times a day (BID) | ORAL | 0 refills | Status: DC | PRN

## 2019-10-24 MED ORDER — AMPHETAMINE-DEXTROAMPHETAMINE 20 MG PO TABS: 20.0000 mg | ORAL_TABLET | Freq: Two times a day (BID) | ORAL | 0 refills | Status: DC

## 2019-10-24 NOTE — Progress Notes (Signed)
I,[name],acting as a scribe for Megan Mans, MD.,have documented all relevant documentation on the behalf of Megan Mans, MD,as directed by  Megan Mans, MD while in the presence of Megan Mans, MD.  Established patient visit   Patient: Krystal Marks   DOB: [birth year]   44 y.o. Female  MRN: [id number] Visit Date: 10/24/2019  Today's healthcare provider: Megan Mans, MD   No chief complaint on file.  Subjective    HPI  Patient feels well and has no complaints.  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 ADD (attention deficit disorder)  From 07/12/2019-Stable.      Medications: Outpatient Medications Prior to Visit  Medication Sig  . amphetamine-dextroamphetamine (ADDERALL) 20 MG tablet Take 1 tablet (20 mg total) by mouth 2 (two) times daily.  Marland Kitchen amphetamine-dextroamphetamine (ADDERALL) 20 MG tablet Take 1 tablet (20 mg total) by mouth 2 (two) times daily as needed.  Marland Kitchen amphetamine-dextroamphetamine (ADDERALL) 20 MG tablet Take 1 tablet (20 mg total) by mouth 2 (two) times daily.  . Cholecalciferol (VITAMIN D3 PO) Take by mouth in the morning and at bedtime.  Marland Kitchen ELDERBERRY PO Take by mouth.  . folic acid (FOLVITE) 400 MCG tablet Take 400 mcg by mouth daily.  . Levocetirizine Dihydrochloride (XYZAL PO) Take by mouth daily.  . nortriptyline (PAMELOR) 25 MG capsule TAKE 1 CAPSULE BY MOUTH EVERY DAY AT BEDTIME  . OVER THE COUNTER MEDICATION Slee Aid gummy-OLLY (magnesium,zinc,Melatonin)  . Pseudoephedrine-guaiFENesin (MUCINEX D MAX STRENGTH PO) Take 1,200 mg by mouth in the morning and at bedtime.  . SUMAtriptan (IMITREX) 100 MG tablet Take 1 tablet (100 mg total) by mouth as needed.  . vitamin B-12 (CYANOCOBALAMIN) 1000 MCG tablet Take 1,000 mcg by mouth daily. OLLY-brand   No facility-administered medications prior to visit.    Review of Systems  Constitutional: Negative for appetite change, chills, fatigue and fever.   Respiratory: Negative for chest tightness and shortness of breath.   Cardiovascular: Negative for chest pain and palpitations.  Gastrointestinal: Negative for abdominal pain, nausea and vomiting.  Neurological: Negative for dizziness and weakness.       Objective    BP (!) 105/54 (BP Location: Right Arm, Patient Position: Sitting, Cuff Size: Large)   Pulse 73   Temp 98.6 F (37 C) (Oral)   Resp 16   Ht 5\' 6"  (1.676 m)   Wt 161 lb (73 kg)   SpO2 99%   BMI 25.99 kg/m   Physical Exam Vitals reviewed.  Constitutional:      General: She is not in acute distress.    Appearance: Normal appearance. She is well-developed. She is not diaphoretic.  HENT:     Head: Normocephalic and atraumatic.  Eyes:     General: No scleral icterus.    Conjunctiva/sclera: Conjunctivae normal.  Neck:     Thyroid: No thyromegaly.  Cardiovascular:     Rate and Rhythm: Normal rate and regular rhythm.     Pulses: Normal pulses.     Heart sounds: Normal heart sounds. No murmur heard.   Pulmonary:     Effort: Pulmonary effort is normal. No respiratory distress.     Breath sounds: Normal breath sounds. No wheezing, rhonchi or rales.  Abdominal:     General: Abdomen is flat.     Palpations: Abdomen is soft.  Musculoskeletal:     Cervical back: Neck supple.     Right lower leg: No edema.  Left lower leg: No edema.  Lymphadenopathy:     Cervical: No cervical adenopathy.  Skin:    General: Skin is warm and dry.     Findings: No rash.  Neurological:     Mental Status: She is alert and oriented to person, place, and time. Mental status is at baseline.  Psychiatric:        Mood and Affect: Mood normal.        Behavior: Behavior normal.     BP (!) 105/54 (BP Location: Right Arm, Patient Position: Sitting, Cuff Size: Large)   Pulse 73   Temp 98.6 F (37 C) (Oral)   Resp 16   Ht 5\' 6"  (1.676 m)   Wt 161 lb (73 kg)   SpO2 99%   BMI 25.99 kg/m    No results found for any visits on  10/24/19.  Assessment & Plan     1. Attention deficit hyperactivity disorder (ADHD), combined type Clinically stable.  She only takes a working days.  Follow-up 4 months. - amphetamine-dextroamphetamine (ADDERALL) 20 MG tablet; Take 1 tablet (20 mg total) by mouth 2 (two) times daily.  Dispense: 60 tablet; Refill: 0 - amphetamine-dextroamphetamine (ADDERALL) 20 MG tablet; Take 1 tablet (20 mg total) by mouth 2 (two) times daily as needed.  Dispense: 60 tablet; Refill: 0 - amphetamine-dextroamphetamine (ADDERALL) 20 MG tablet; Take 1 tablet (20 mg total) by mouth 2 (two) times daily.  Dispense: 60 tablet; Refill: 0  2. Gastroesophageal reflux disease, unspecified whether esophagitis present   3. Fibromyalgia Improved   4. Anxiety, generalized Stable recently.   No follow-ups on file.         Jonan Seufert 10/26/19, MD  [facility] [phone] (phone) [phone] (fax)  [facility]

## 2019-12-05 ENCOUNTER — Ambulatory Visit (INDEPENDENT_AMBULATORY_CARE_PROVIDER_SITE_OTHER): Payer: 59 | Admitting: Family Medicine

## 2019-12-05 ENCOUNTER — Other Ambulatory Visit: Payer: Self-pay

## 2019-12-05 ENCOUNTER — Encounter: Payer: Self-pay | Admitting: Family Medicine

## 2019-12-05 VITALS — BP 109/71 | HR 76 | Temp 98.1°F | Ht 65.75 in | Wt 164.0 lb

## 2019-12-05 DIAGNOSIS — R32 Unspecified urinary incontinence: Secondary | ICD-10-CM

## 2019-12-05 DIAGNOSIS — F902 Attention-deficit hyperactivity disorder, combined type: Secondary | ICD-10-CM

## 2019-12-05 DIAGNOSIS — Z0000 Encounter for general adult medical examination without abnormal findings: Secondary | ICD-10-CM | POA: Diagnosis not present

## 2019-12-05 DIAGNOSIS — Z1389 Encounter for screening for other disorder: Secondary | ICD-10-CM

## 2019-12-05 DIAGNOSIS — M797 Fibromyalgia: Secondary | ICD-10-CM

## 2019-12-05 DIAGNOSIS — G43919 Migraine, unspecified, intractable, without status migrainosus: Secondary | ICD-10-CM

## 2019-12-05 DIAGNOSIS — F411 Generalized anxiety disorder: Secondary | ICD-10-CM

## 2019-12-05 LAB — POCT URINALYSIS DIPSTICK
Bilirubin, UA: NEGATIVE
Glucose, UA: NEGATIVE
Ketones, UA: NEGATIVE
Leukocytes, UA: NEGATIVE
Nitrite, UA: NEGATIVE
Protein, UA: NEGATIVE
Spec Grav, UA: 1.01 (ref 1.010–1.025)
Urobilinogen, UA: 0.2 E.U./dL
pH, UA: 6 (ref 5.0–8.0)

## 2019-12-05 NOTE — Progress Notes (Signed)
Complete physical exam   Patient: Krystal Marks   DOB: 1975/02/15   44 y.o. Female  MRN: [id number] Visit Date: 12/05/2019  Today's healthcare provider: Megan Mansichard Gilbert Jr, MD   Chief Complaint  Patient presents with  . Annual Exam  . Urinary Incontinence   Subjective    Krystal Marks is a 44 y.o. female who presents today for a complete physical exam.  She reports consuming a general diet.  She generally feels well. She reports sleeping well. She does have additional problems to discuss today.  She sees Dr. Legrand PittsMacomb from GYN for her Pap smears and pelvic exams./Well woman exam. HPI   Urinary Incontinence  Menopausal Symptoms She complains of several weeks of urine  pressure and urgency.  No hematuria or incontinence.  Azo is not helpful. Her other complaint is 1 of sweating a lot in her hair.  No hot flashes or night sweats.  Past Medical History:  Diagnosis Date  . Acid reflux 08/16/2014  . ADD (attention deficit disorder)   . Alcohol abuse 05/25/2017  . Anxiety   . Anxiety, generalized 08/16/2014  . 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 Benzodiazepine abuse (HCC) 05/25/2017  . Benzodiazepine overdose 05/25/2017  . Cigarette smoker 08/16/2014  . Depression   . Depression, major, single episode, moderate (HCC) 08/16/2014  . Fatigue 01/20/2017  . Fibromyalgia   . Gastroduodenal ulcer 08/16/2014  . GERD (gastroesophageal reflux disease)   . IBS (irritable bowel syndrome)   . Irregular bleeding 08/16/2014  . Migraine 08/16/2014  . Migraine headache    monthly  . 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   Past Surgical History:  Procedure Laterality Date  . BREAST ENHANCEMENT SURGERY  2007  . COLONOSCOPY    . ESOPHAGOGASTRODUODENOSCOPY     x4  . KNEE ARTHROSCOPY WITH PATELLA RECONSTRUCTION Left 07/19/2017   Procedure: KNEE ARTHROSCOPY PARTIAL MEDIAL MENISECTOMY PATELLA CHONDROPLASTY;  Surgeon: Signa KellPatel, Sunny, MD;  [facility];  Service: Orthopedics;  Laterality: Left;  . SHOULDER ARTHROSCOPY Right    Social History   Socioeconomic History  . Marital status: Divorced    Spouse name: Not on file  . Number of children: Not on file  . Years of education: Not on file  . Highest education level: Not on file  Occupational History  . Not on file  Tobacco Use  . Smoking status: Current Every Day Smoker    Packs/day: 0.50    Years: 20.00    Pack years: 10.00    Types: Cigarettes    Last attempt to quit: 06/03/2017    Years since quitting: 2.5  . Smokeless tobacco: Never Used  . Tobacco comment:    Vaping Use  . Vaping Use: Never used  Substance and Sexual Activity  . Alcohol use: Yes    Alcohol/week: 4.0 standard drinks    Types: 4 Glasses of wine per week  . Drug use: No  . Sexual activity: Not on file  Other Topics Concern  . Not on file  Social History Narrative  . Not on file   Social Determinants of Health   Financial Resource Strain:   . Difficulty of Paying Living Expenses: Not on file  Food Insecurity:   . Worried About Programme researcher, broadcasting/film/videounning Out of Food in the Last Year: Not on file  . Ran Out of Food in the Last Year: Not on file  Transportation Needs:   . Lack of Transportation (Medical): Not on file  . Lack  of Transportation (Non-Medical): Not on file  Physical Activity:   . Days of Exercise per Week: Not on file  . Minutes of Exercise per Session: Not on file  Stress:   . Feeling of Stress : Not on file  Social Connections:   . Frequency of Communication with Friends and Family: Not on file  . Frequency of Social Gatherings with Friends and Family: Not on file  . Attends Religious Services: Not on file  . Active Member of Clubs or Organizations: Not on file  . Attends Banker Meetings: Not on file  . Marital Status: Not on file  Intimate Partner Violence:   . Fear of Current or Ex-Partner: Not on file  . Emotionally Abused: Not on file  . Physically Abused:  Not on file  . Sexually Abused: Not on file   Family Status  Relation Name Status  . Mother  Alive  . Father  Alive  . Brother  Alive  . MGM  Alive  . MGF  Deceased  . Neg Hx  (Not Specified)   Family History  Problem Relation Age of Onset  . Healthy Mother   . Healthy Father   . Healthy Brother   . Osteoporosis Maternal Grandmother   . Esophageal cancer Maternal Grandfather   . Colon cancer Neg Hx   . Breast cancer Neg Hx    Allergies  Allergen Reactions  . Duloxetine Nausea And Vomiting    CAN NOT TAKE WITH IMITREX   . Iodinated Diagnostic Agents Itching    Omnpaque i 300, 10 min delayed reaction of large hives, itching & urticaria// will require 13hr prep for future iv contrast,//a.calhoun  . Meperidine     Other reaction(s): Respiratory Distress  . Dilaudid [Hydromorphone]     Hard times breathing    Patient Care Team: Maple Hudson., MD as PCP - General (Family Medicine)   Medications: Outpatient Medications Prior to Visit  Medication Sig  . amphetamine-dextroamphetamine (ADDERALL) 20 MG tablet Take 1 tablet (20 mg total) by mouth 2 (two) times daily.  Marland Kitchen amphetamine-dextroamphetamine (ADDERALL) 20 MG tablet Take 1 tablet (20 mg total) by mouth 2 (two) times daily as needed.  Marland Kitchen amphetamine-dextroamphetamine (ADDERALL) 20 MG tablet Take 1 tablet (20 mg total) by mouth 2 (two) times daily.  . Cholecalciferol (VITAMIN D3 PO) Take by mouth in the morning and at bedtime.  Marland Kitchen ELDERBERRY PO Take by mouth.  . folic acid (FOLVITE) 400 MCG tablet Take 400 mcg by mouth daily.  . Levocetirizine Dihydrochloride (XYZAL PO) Take by mouth daily.  . nortriptyline (PAMELOR) 25 MG capsule TAKE 1 CAPSULE BY MOUTH EVERY DAY AT BEDTIME  . OVER THE COUNTER MEDICATION Slee Aid gummy-OLLY (magnesium,zinc,Melatonin)  . Probiotic Product (PROBIOTIC ADVANCED PO) Take by mouth.  . Pseudoephedrine-guaiFENesin (MUCINEX D MAX STRENGTH PO) Take 1,200 mg by mouth in the morning and at  bedtime.  . SUMAtriptan (IMITREX) 100 MG tablet Take 1 tablet (100 mg total) by mouth as needed.  . vitamin B-12 (CYANOCOBALAMIN) 1000 MCG tablet Take 1,000 mcg by mouth daily. OLLY-brand   No facility-administered medications prior to visit.    Review of Systems  Constitutional: Negative.   HENT: Negative.   Eyes: Negative.   Respiratory: Negative.   Cardiovascular: Negative.   Gastrointestinal: Negative.   Endocrine: Positive for polyuria. Negative for cold intolerance, heat intolerance, polydipsia and polyphagia.  Genitourinary: Positive for dysuria, enuresis, frequency and urgency. Negative for decreased urine volume, difficulty urinating, dyspareunia, flank  pain, genital sores, hematuria, menstrual problem, pelvic pain, vaginal bleeding, vaginal discharge and vaginal pain.  Musculoskeletal: Negative.   Skin: Negative.   Allergic/Immunologic: Positive for environmental allergies. Negative for food allergies and immunocompromised state.  Neurological: Positive for light-headedness. Negative for dizziness, tremors, seizures, syncope, facial asymmetry, speech difficulty, weakness, numbness and headaches.  Hematological: Negative.   Psychiatric/Behavioral: Negative.   All other systems reviewed and are negative.     Objective    BP 109/71 (BP Location: Right Arm, Patient Position: Sitting, Cuff Size: Large)   Pulse 76   Temp 98.1 F (36.7 C) (Oral)   Ht 5' 5.75" (1.67 m)   Wt 164 lb (74.4 kg)   BMI 26.67 kg/m    Physical Exam Vitals reviewed.  Constitutional:      General: She is not in acute distress.    Appearance: She is well-developed. She is not diaphoretic.  HENT:     Head: Normocephalic and atraumatic.     Right Ear: Hearing, tympanic membrane, ear canal and external ear normal.     Left Ear: Hearing, tympanic membrane, ear canal and external ear normal.     Nose: Nose normal.     Mouth/Throat:     Pharynx: Uvula midline. No oropharyngeal exudate.  Eyes:      General: No scleral icterus.       Right eye: No discharge.        Left eye: No discharge.     Conjunctiva/sclera: Conjunctivae normal.     Pupils: Pupils are equal, round, and reactive to light.  Neck:     Thyroid: No thyromegaly.     Vascular: No carotid bruit or JVD.     Trachea: No tracheal deviation.  Cardiovascular:     Rate and Rhythm: Normal rate and regular rhythm.     Heart sounds: Normal heart sounds. No murmur heard.  No friction rub. No gallop.   Pulmonary:     Effort: Pulmonary effort is normal. No respiratory distress.     Breath sounds: Normal breath sounds. No wheezing or rales.  Chest:     Chest wall: No tenderness.     Breasts: Breasts are symmetrical.        Right: No inverted nipple, mass, nipple discharge, skin change or tenderness.        Left: No inverted nipple, mass, nipple discharge, skin change or tenderness.  Abdominal:     General: Bowel sounds are normal. There is no distension.     Palpations: Abdomen is soft. There is no mass.     Tenderness: There is no abdominal tenderness. There is no guarding or rebound.     Hernia: There is no hernia in the left inguinal area.  Genitourinary:    Exam position: Supine.     Labia:        Right: No rash, tenderness, lesion or injury.        Left: No rash, tenderness, lesion or injury.      Vagina: No signs of injury. No erythema, tenderness or bleeding.     Cervix: No cervical motion tenderness, discharge or friability.     Adnexa:        Right: No mass, tenderness or fullness.         Left: No mass, tenderness or fullness.    Musculoskeletal:        General: No tenderness. Normal range of motion.     Cervical back: Normal range of motion and neck supple.  Lymphadenopathy:  Cervical: No cervical adenopathy.     Lower Body: No right inguinal adenopathy. No left inguinal adenopathy.  Skin:    General: Skin is warm and dry.     Findings: No rash.  Neurological:     General: No focal deficit present.      Mental Status: She is alert and oriented to person, place, and time.     Cranial Nerves: No cranial nerve deficit.     Coordination: Coordination normal.     Deep Tendon Reflexes: Reflexes are normal and symmetric.  Psychiatric:        Mood and Affect: Mood normal.        Behavior: Behavior normal.        Thought Content: Thought content normal.        Judgment: Judgment normal.       Last depression screening scores PHQ 2/9 Scores 12/05/2019 07/12/2019 01/20/2017  PHQ - 2 Score 0 0 0  PHQ- 9 Score 0 0 5   Last fall risk screening Fall Risk  12/05/2019  Falls in the past year? 0  Number falls in past yr: 0  Injury with Fall? 0  Risk for fall due to : No Fall Risks  Follow up Falls evaluation completed   Last Audit-C alcohol use screening Alcohol Use Disorder Test (AUDIT) 12/05/2019  1. How often do you have a drink containing alcohol? 3  2. How many drinks containing alcohol do you have on a typical day when you are drinking? 0  3. How often do you have six or more drinks on one occasion? 0  AUDIT-C Score 3   A score of 3 or more in women, and 4 or more in men indicates increased risk for alcohol abuse, EXCEPT if all of the points are from question 1   Results for orders placed or performed in visit on 12/05/19  POCT urinalysis dipstick  Result Value Ref Range   Color, UA     Clarity, UA     Glucose, UA Negative Negative   Bilirubin, UA Negative    Ketones, UA Negative    Spec Grav, UA 1.010 1.010 - 1.025   Blood, UA Trace    pH, UA 6.0 5.0 - 8.0   Protein, UA Negative Negative   Urobilinogen, UA 0.2 0.2 or 1.0 E.U./dL   Nitrite, UA Negative    Leukocytes, UA Negative Negative   Appearance     Odor      Assessment & Plan    Routine Health Maintenance and Physical Exam  Exercise Activities and Dietary recommendations Goals   None     Immunization History  Administered Date(s) Administered  . Hepatitis B 07/05/2014, 08/05/2014, 10/05/2014  . Hepatitis B,  adult 08/21/2014, 10/24/2014  . Influenza,inj,Quad PF,6+ Mos 01/27/2016, 01/20/2017, 09/21/2017  . Tdap 07/16/2014    Health Maintenance  Topic Date Due  . Hepatitis C Screening  Never done  . COVID-19 Vaccine (1) Never done  . MAMMOGRAM  02/09/2018  . PAP SMEAR-Modifier  01/21/2020  . INFLUENZA VACCINE  04/03/2020 (Originally 08/05/2019)  . TETANUS/TDAP  07/15/2024  . HIV Screening  Completed    Discussed health benefits of physical activity, and encouraged her to engage in regular exercise appropriate for her age and condition.  1. Annual physical exam Well woman exam per Dr. Arelia Sneddon. She will discuss possible hormone therapy with him. - Lipid panel - TSH - CBC w/Diff/Platelet - Comprehensive Metabolic Panel (CMET)  2. Screening for blood or protein in  urine  - POCT urinalysis dipstick  3. Urinary incontinence, unspecified type Consider referral to urology if symptoms persist and Dr. Pete Pelt finds exam to be normal. - CULTURE, URINE COMPREHENSIVE  4. Attention deficit hyperactivity disorder (ADHD), combined type Refill Adderall  5. Fibromyalgia On nortriptyline.  Plan to stop nortriptyline if she gets pregnant.  Is consider trying to do so with her boyfriend.  6. Anxiety, generalized   7. Intractable migraine without status migrainosus, unspecified migraine type    No follow-ups on file.        Richard Wendelyn Breslow, MD  [facility] [phone] (phone) [phone] (fax)  [facility]

## 2019-12-07 ENCOUNTER — Telehealth: Payer: Self-pay

## 2019-12-07 NOTE — Telephone Encounter (Signed)
Copied from CRM [id number]. Topic: General - Other >> Dec 07, 2019  2:30 PM Tamela Oddi wrote: Reason for CRM: Patient called to ask questions about her urine culture.  Patient saw result sin My Chart but would like to speak with a nurse.  Please call patient to discuss at [phone]

## 2019-12-09 LAB — CULTURE, URINE COMPREHENSIVE

## 2019-12-12 NOTE — Telephone Encounter (Signed)
LMOVM for pt to return call 

## 2019-12-18 NOTE — Telephone Encounter (Signed)
Patient reports no question at this time.

## 2020-01-10 ENCOUNTER — Other Ambulatory Visit: Payer: Self-pay | Admitting: Family Medicine

## 2020-01-23 ENCOUNTER — Other Ambulatory Visit: Payer: Self-pay

## 2020-01-23 ENCOUNTER — Encounter: Payer: Self-pay | Admitting: Family Medicine

## 2020-01-23 ENCOUNTER — Ambulatory Visit (INDEPENDENT_AMBULATORY_CARE_PROVIDER_SITE_OTHER): Payer: 59 | Admitting: Family Medicine

## 2020-01-23 VITALS — BP 111/61 | HR 70 | Temp 98.1°F | Resp 16 | Ht 66.0 in | Wt 157.0 lb

## 2020-01-23 DIAGNOSIS — F900 Attention-deficit hyperactivity disorder, predominantly inattentive type: Secondary | ICD-10-CM

## 2020-01-23 DIAGNOSIS — F902 Attention-deficit hyperactivity disorder, combined type: Secondary | ICD-10-CM

## 2020-01-23 MED ORDER — AMPHETAMINE-DEXTROAMPHETAMINE 20 MG PO TABS: 20.0000 mg | ORAL_TABLET | Freq: Two times a day (BID) | ORAL | 0 refills | Status: DC | PRN

## 2020-01-23 MED ORDER — AMPHETAMINE-DEXTROAMPHETAMINE 20 MG PO TABS: 20.0000 mg | ORAL_TABLET | Freq: Two times a day (BID) | ORAL | 0 refills | Status: DC

## 2020-01-23 NOTE — Progress Notes (Signed)
I,Dalante Minus,acting as a scribe for Megan Mans, MD.,have documented all relevant documentation on the behalf of Megan Mans, MD,as directed by  Megan Mans, MD while in the presence of Megan Mans, MD.   Established patient visit   Patient: Krystal Marks   DOB: Jan 14, 1975   45 y.o. Female  MRN: [id number] Visit Date: 01/23/2020  Today's healthcare provider: Megan Mans, MD   No chief complaint on file.  Subjective    HPI  Patient comes in today for follow-up.  She is doing well other than mourning the death of her father who died suddenly over Christmas from an aortic aneurysm rupture. Patient half pack per day smoker. States hydroxyzine has helped her anxiety. She request printed refill for Adderall. Follow up for ADHD  The patient was last seen for this 1 months ago. Changes made at last visit include no changes.  She reports good compliance with treatment. She feels that condition is Unchanged. She is not having side effects. none  --------------------------------------------------------------------  Patient Active Problem List   Diagnosis Date Noted  . Benzodiazepine abuse (HCC) 05/25/2017  . Alcohol abuse 05/25/2017  . Benzodiazepine overdose 05/25/2017  . 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  . 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 Fatigue 01/20/2017  . Fibromyalgia 01/12/2016  . ADD (attention deficit disorder) 08/16/2014  . Cigarette smoker 08/16/2014  . Depression, major, single episode, moderate (HCC) 08/16/2014  . Anxiety, generalized 08/16/2014  . Acid reflux 08/16/2014  . Irregular bleeding 08/16/2014  . Cannot sleep 08/16/2014  . Migraine 08/16/2014  . Gastroduodenal ulcer 08/16/2014  . Anxiety 06/18/2014  . NONSPECIFIC ABNORM RESULTS OTH SPEC FUNCT STUDY 09/28/2007   Social History   Tobacco Use  . Smoking status: Current Every Day Smoker    Packs/day:  0.50    Years: 20.00    Pack years: 10.00    Types: Cigarettes    Last attempt to quit: 06/03/2017    Years since quitting: 2.6  . Smokeless tobacco: Never Used  . Tobacco comment:    Vaping Use  . Vaping Use: Never used  Substance Use Topics  . Alcohol use: Yes    Alcohol/week: 4.0 standard drinks    Types: 4 Glasses of wine per week  . Drug use: No   Allergies  Allergen Reactions  . Duloxetine Nausea And Vomiting    CAN NOT TAKE WITH IMITREX   . Iodinated Diagnostic Agents Itching    Omnpaque i 300, 10 min delayed reaction of large hives, itching & urticaria// will require 13hr prep for future iv contrast,//a.calhoun  . Meperidine     Other reaction(s): Respiratory Distress  . Dilaudid [Hydromorphone]     Hard times breathing       Medications: Outpatient Medications Prior to Visit  Medication Sig  . amphetamine-dextroamphetamine (ADDERALL) 20 MG tablet Take 1 tablet (20 mg total) by mouth 2 (two) times daily.  Marland Kitchen amphetamine-dextroamphetamine (ADDERALL) 20 MG tablet Take 1 tablet (20 mg total) by mouth 2 (two) times daily as needed.  Marland Kitchen amphetamine-dextroamphetamine (ADDERALL) 20 MG tablet Take 1 tablet (20 mg total) by mouth 2 (two) times daily.  . Cholecalciferol (VITAMIN D3 PO) Take by mouth in the morning and at bedtime.  Marland Kitchen ELDERBERRY PO Take by mouth.  . folic acid (FOLVITE) 400 MCG tablet Take 400 mcg by mouth daily.  Marland Kitchen HYDROXYZINE HCL PO Take by mouth.  . Levocetirizine Dihydrochloride (XYZAL PO) Take by mouth daily.  Marland Kitchen  OVER THE COUNTER MEDICATION Slee Aid gummy-OLLY (magnesium,zinc,Melatonin)  . Probiotic Product (PROBIOTIC ADVANCED PO) Take by mouth.  . Pseudoephedrine-guaiFENesin (MUCINEX D MAX STRENGTH PO) Take 1,200 mg by mouth in the morning and at bedtime.  . SUMAtriptan (IMITREX) 100 MG tablet Take 1 tablet (100 mg total) by mouth as needed.  . vitamin B-12 (CYANOCOBALAMIN) 1000 MCG tablet Take 1,000 mcg by mouth daily. OLLY-brand  . nortriptyline (PAMELOR)  25 MG capsule TAKE 1 CAPSULE BY MOUTH AT BEDTIME (Patient not taking: Reported on 01/23/2020)   No facility-administered medications prior to visit.    Review of Systems  Last CBC Lab Results  Component Value Date   WBC 10.1 09/26/2019   HGB 13.0 09/26/2019   HCT 37.3 09/26/2019   MCV 94 09/26/2019   MCH 32.6 09/26/2019   RDW 11.4 (L) 09/26/2019   PLT 308 09/26/2019   Last metabolic panel Lab Results  Component Value Date   GLUCOSE 83 09/26/2019   NA 138 09/26/2019   K 4.6 09/26/2019   CL 101 09/26/2019   CO2 22 09/26/2019   BUN 11 09/26/2019   CREATININE 0.67 09/26/2019   GFRNONAA 107 09/26/2019   GFRAA 124 09/26/2019   CALCIUM 8.7 09/26/2019   PROT 7.6 09/20/2018   ALBUMIN 4.4 09/20/2018   LABGLOB 2.7 01/12/2017   AGRATIO 1.6 01/12/2017   BILITOT 1.5 (H) 09/20/2018   ALKPHOS 53 09/20/2018   AST 18 09/20/2018   ALT 15 09/20/2018   ANIONGAP 10 09/20/2018   Last lipids Lab Results  Component Value Date   CHOL 141 01/20/2017   HDL 66 01/20/2017   LDLCALC 67 01/20/2017   TRIG 42 01/20/2017   CHOLHDL 2.1 01/20/2017   Last hemoglobin A1c No results found for: HGBA1C Last thyroid functions Lab Results  Component Value Date   TSH 1.170 01/12/2017       Objective    BP 111/61 (BP Location: Left Arm, Patient Position: Sitting, Cuff Size: Large)   Pulse 70   Temp 98.1 F (36.7 C) (Oral)   Resp 16   Ht 5\' 6"  (1.676 m)   Wt 157 lb (71.2 kg)   SpO2 98%   BMI 25.34 kg/m  BP Readings from Last 3 Encounters:  01/23/20 111/61  12/05/19 109/71  10/24/19 (!) 105/54   Wt Readings from Last 3 Encounters:  01/23/20 157 lb (71.2 kg)  12/05/19 164 lb (74.4 kg)  10/24/19 161 lb (73 kg)       Physical Exam Vitals reviewed.  Constitutional:      General: She is not in acute distress.    Appearance: Normal appearance. She is well-developed. She is not diaphoretic.  HENT:     Head: Normocephalic and atraumatic.  Eyes:     General: No scleral icterus.     Conjunctiva/sclera: Conjunctivae normal.  Neck:     Thyroid: No thyromegaly.  Cardiovascular:     Rate and Rhythm: Normal rate and regular rhythm.     Pulses: Normal pulses.     Heart sounds: Normal heart sounds. No murmur heard.   Pulmonary:     Effort: Pulmonary effort is normal. No respiratory distress.     Breath sounds: Normal breath sounds. No wheezing, rhonchi or rales.  Abdominal:     General: Abdomen is flat.     Palpations: Abdomen is soft.  Musculoskeletal:     Cervical back: Neck supple.     Right lower leg: No edema.     Left lower leg: No edema.  Lymphadenopathy:     Cervical: No cervical adenopathy.  Skin:    General: Skin is warm and dry.     Findings: No rash.  Neurological:     Mental Status: She is alert and oriented to person, place, and time. Mental status is at baseline.  Psychiatric:        Mood and Affect: Mood normal.        Behavior: Behavior normal.        Thought Content: Thought content normal.        Judgment: Judgment normal.       No results found for any visits on 01/23/20.  Assessment & Plan     1. Attention deficit hyperactivity disorder, predominantly inattentive type Patient appropriately mourning the death of her father.  2. Attention deficit hyperactivity disorder (ADHD), combined type  - amphetamine-dextroamphetamine (ADDERALL) 20 MG tablet; Take 1 tablet (20 mg total) by mouth 2 (two) times daily.  Dispense: 60 tablet; Refill: 0 - amphetamine-dextroamphetamine (ADDERALL) 20 MG tablet; Take 1 tablet (20 mg total) by mouth 2 (two) times daily as needed.  Dispense: 60 tablet; Refill: 0 - amphetamine-dextroamphetamine (ADDERALL) 20 MG tablet; Take 1 tablet (20 mg total) by mouth 2 (two) times daily.  Dispense: 60 tablet; Refill: 0   No follow-ups on file.      I, Megan Mans, MD, have reviewed all documentation for this visit. The documentation on 01/31/20 for the exam, diagnosis, procedures, and orders are all accurate  and complete.    Richard Wendelyn Breslow, MD  [facility] [phone] (phone) [phone] (fax)  [facility]

## 2020-01-28 ENCOUNTER — Encounter: Payer: Self-pay | Admitting: Urology

## 2020-01-28 ENCOUNTER — Other Ambulatory Visit: Payer: Self-pay

## 2020-01-28 ENCOUNTER — Ambulatory Visit (INDEPENDENT_AMBULATORY_CARE_PROVIDER_SITE_OTHER): Payer: 59 | Admitting: Urology

## 2020-01-28 VITALS — BP 107/68 | HR 66 | Ht 66.0 in | Wt 157.0 lb

## 2020-01-28 DIAGNOSIS — R1031 Right lower quadrant pain: Secondary | ICD-10-CM | POA: Diagnosis not present

## 2020-01-28 DIAGNOSIS — R3915 Urgency of urination: Secondary | ICD-10-CM | POA: Diagnosis not present

## 2020-01-28 DIAGNOSIS — R350 Frequency of micturition: Secondary | ICD-10-CM | POA: Diagnosis not present

## 2020-01-28 LAB — MICROSCOPIC EXAMINATION

## 2020-01-28 LAB — URINALYSIS, COMPLETE
Bilirubin, UA: NEGATIVE
Glucose, UA: NEGATIVE
Ketones, UA: NEGATIVE
Leukocytes,UA: NEGATIVE
Nitrite, UA: NEGATIVE
Specific Gravity, UA: >1.03 — ABNORMAL HIGH (ref 1.005–1.030)
Urobilinogen, Ur: 0.2 mg/dL (ref 0.2–1.0)
pH, UA: 5 (ref 5.0–7.5)

## 2020-01-28 MED ORDER — PREDNISONE 50 MG PO TABS: ORAL_TABLET | ORAL | 0 refills | Status: DC

## 2020-01-28 MED ORDER — DIPHENHYDRAMINE HCL 50 MG PO TABS: ORAL_TABLET | ORAL | 0 refills | Status: DC

## 2020-01-28 MED ORDER — VIBEGRON 75 MG PO TABS: 75.0000 mg | ORAL_TABLET | Freq: Every day | ORAL | 11 refills | Status: DC

## 2020-01-28 NOTE — Progress Notes (Signed)
01/28/2020 9:38 AM   Krystal Marks [birth year] [id number]  Referring provider: Richardean Chimera, MD [address] [place],  [place] [address]  Chief Complaint  Patient presents with  . Urinary Urgency    HPI: I was consulted to assess the patient's mild pelvic discomfort and urgency worse over the last 6 to 12 months.  First thing in the morning she feels a nagging discomfort in the suprapubic area.  It is daily and comes and goes.  It is not relieved by voiding.  No dyspareunia.  She has a history of irritable bowel syndrome and fibromyalgia.  For seeing in the morning she has some vague back discomfort on both sides  Normally she voids every 1-2 hours and sometimes a few times an hour.  She gets up at least once a night.  She is continent  No history of bladder surgery kidney stones.  Used to get bladder infections.  Normal bowel movements.  No hysterectomy.  No neurologic issues  PMH: Past Medical History:  Diagnosis Date  . Acid reflux 08/16/2014  . ADD (attention deficit disorder)   . Alcohol abuse 05/25/2017  . Anxiety   . Anxiety, generalized 08/16/2014  . 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 Benzodiazepine abuse (HCC) 05/25/2017  . Benzodiazepine overdose 05/25/2017  . Cigarette smoker 08/16/2014  . Depression   . Depression, major, single episode, moderate (HCC) 08/16/2014  . Fatigue 01/20/2017  . Fibromyalgia   . Gastroduodenal ulcer 08/16/2014  . GERD (gastroesophageal reflux disease)   . IBS (irritable bowel syndrome)   . Irregular bleeding 08/16/2014  . Migraine 08/16/2014  . Migraine headache    monthly  . 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 BREAST ENHANCEMENT SURGERY  2007  . COLONOSCOPY    . ESOPHAGOGASTRODUODENOSCOPY     x4  . KNEE ARTHROSCOPY WITH PATELLA RECONSTRUCTION Left 07/19/2017   Procedure: KNEE ARTHROSCOPY PARTIAL  MEDIAL MENISECTOMY PATELLA CHONDROPLASTY;  Surgeon: Signa Kell, MD;  Location: [facility];  Service: Orthopedics;  Laterality: Left;  . SHOULDER ARTHROSCOPY Right     Home Medications:  Allergies as of 01/28/2020      Reactions   Duloxetine Nausea And Vomiting   CAN NOT TAKE WITH IMITREX    Iodinated Diagnostic Agents Itching   Omnpaque i 300, 10 min delayed reaction of large hives, itching & urticaria// will require 13hr prep for future iv contrast,//a.calhoun   Meperidine    Other reaction(s): Respiratory Distress   Dilaudid [hydromorphone]    Hard times breathing      Medication List       Accurate as of January 28, 2020  9:38 AM. If you have any questions, ask your nurse or doctor.        amphetamine-dextroamphetamine 20 MG tablet Commonly known as: Adderall Take 1 tablet (20 mg total) by mouth 2 (two) times daily.   amphetamine-dextroamphetamine 20 MG tablet Commonly known as: ADDERALL Take 1 tablet (20 mg total) by mouth 2 (two) times daily as needed.   amphetamine-dextroamphetamine 20 MG tablet Commonly known as: Adderall Take 1 tablet (20 mg total) by mouth 2 (two) times daily.   ELDERBERRY PO Take by mouth.   folic acid 400 MCG tablet Commonly known as: FOLVITE Take 400 mcg by mouth daily.   HYDROXYZINE HCL PO Take by mouth.   MUCINEX D MAX STRENGTH PO Take 1,200 mg by mouth in the morning  and at bedtime.   nortriptyline 25 MG capsule Commonly known as: PAMELOR TAKE 1 CAPSULE BY MOUTH AT BEDTIME   OVER THE COUNTER MEDICATION Slee Aid gummy-OLLY (magnesium,zinc,Melatonin)   PROBIOTIC ADVANCED PO Take by mouth.   SUMAtriptan 100 MG tablet Commonly known as: IMITREX Take 1 tablet (100 mg total) by mouth as needed.   vitamin B-12 1000 MCG tablet Commonly known as: CYANOCOBALAMIN Take 1,000 mcg by mouth daily. OLLY-brand   VITAMIN D3 PO Take by mouth in the morning and at bedtime.   XYZAL PO Take by mouth daily.        Allergies:  Allergies  Allergen Reactions  . Duloxetine Nausea And Vomiting    CAN NOT TAKE WITH IMITREX   . Iodinated Diagnostic Agents Itching    Omnpaque i 300, 10 min delayed reaction of large hives, itching & urticaria// will require 13hr prep for future iv contrast,//a.calhoun  . Meperidine     Other reaction(s): Respiratory Distress  . Dilaudid [Hydromorphone]     Hard times breathing    Family History: Family History  Problem Relation Age of Onset  . Healthy Mother   . Healthy Father   . Healthy Brother   . Osteoporosis Maternal Grandmother   . Esophageal cancer Maternal Grandfather   . Colon cancer Neg Hx   . Breast cancer Neg Hx     Social History:  reports that she has been smoking cigarettes. She has a 10.00 pack-year smoking history. She has never used smokeless tobacco. She reports current alcohol use of about 4.0 standard drinks of alcohol per week. She reports that she does not use drugs.  ROS:                                        Physical Exam: There were no vitals taken for this visit.  Constitutional:  Alert and oriented, No acute distress. HEENT: [place] AT, moist mucus membranes.  Trachea midline, no masses. Cardiovascular: No clubbing, cyanosis, or edema. Respiratory: Normal respiratory effort, no increased work of breathing. GI: Abdomen is soft, nontender, nondistended, no abdominal masses GU: No CVA tenderness.  No diverticulum.  Bladder nonsensitive.  No levator tenderness Skin: No rashes, bruises or suspicious lesions. Lymph: No cervical or inguinal adenopathy. Neurologic: Grossly intact, no focal deficits, moving all 4 extremities. Psychiatric: Normal mood and affect.  Laboratory Data: Lab Results  Component Value Date   WBC 10.1 09/26/2019   HGB 13.0 09/26/2019   HCT 37.3 09/26/2019   MCV 94 09/26/2019   PLT 308 09/26/2019    Lab Results  Component Value Date   CREATININE 0.67 09/26/2019    No results  found for: PSA  No results found for: TESTOSTERONE  No results found for: HGBA1C  Urinalysis    Component Value Date/Time   COLORURINE STRAW (A) 05/25/2017 0210   APPEARANCEUR CLEAR (A) 05/25/2017 0210   LABSPEC 1.003 (L) 05/25/2017 0210   PHURINE 6.0 05/25/2017 0210   GLUCOSEU NEGATIVE 05/25/2017 0210   HGBUR SMALL (A) 05/25/2017 0210   BILIRUBINUR Negative 12/05/2019 1528   KETONESUR NEGATIVE 05/25/2017 0210   PROTEINUR Negative 12/05/2019 1528   PROTEINUR NEGATIVE 05/25/2017 0210   UROBILINOGEN 0.2 12/05/2019 1528   UROBILINOGEN 0.2 08/12/2016 1534   NITRITE Negative 12/05/2019 1528   NITRITE NEGATIVE 05/25/2017 0210   LEUKOCYTESUR Negative 12/05/2019 1528    Pertinent Imaging: Urine reviewed.  Urine sent for  culture.  Chart reviewed.  Urine culture last month negative  Assessment & Plan: Patient does have some frequency and urgency.  At times she almost does not get to the restroom in time.  She has mild nocturia.  She has vague abdominal discomfort or pressure.  The role of a safety CT scan was discussed.  Urine was sent for culture.  The role of an overactive bladder medication cystoscopy was discussed.  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  My index of suspicion is milder but next visit after cystoscopy hydrodistention is a potential option for her to consider for her ongoing but overall milder symptoms.  She understands that her bladder might be inflamed or not.  It will be interesting if her symptoms respond to an overactive bladder medication.  New beta 3 agonist given with samples and co-pay card  There are no diagnoses linked to this encounter.  No follow-ups on file.  Martina Sinner, MD  [facility] [address], [address] [place], [place] [address] [phone]

## 2020-01-28 NOTE — Addendum Note (Signed)
Addended by: Milas Kocher A on: 01/28/2020 10:34 AM   Modules accepted: Orders

## 2020-01-28 NOTE — Addendum Note (Signed)
Addended by: Milas Kocher A on: 01/28/2020 11:21 AM   Modules accepted: Orders

## 2020-01-28 NOTE — Patient Instructions (Signed)
Cystoscopy Cystoscopy is a procedure that is used to help diagnose and sometimes treat conditions that affect the lower urinary tract. The lower urinary tract includes the bladder and the urethra. The urethra is the tube that drains urine from the bladder. Cystoscopy is done using a thin, tube-shaped instrument with a light and camera at the end (cystoscope). The cystoscope may be hard or flexible, depending on the goal of the procedure. The cystoscope is inserted through the urethra, into the bladder. Cystoscopy may be recommended if you have:  Urinary tract infections that keep coming back.  Blood in the urine (hematuria).  An inability to control when you urinate (urinary incontinence) or an overactive bladder.  Unusual cells found in a urine sample.  A blockage in the urethra, such as a urinary stone.  Painful urination.  An abnormality in the bladder found during an intravenous pyelogram (IVP) or CT scan. Cystoscopy may also be done to remove a sample of tissue to be examined under a microscope (biopsy). What are the risks? Generally, this is a safe procedure. However, problems may occur, including:  Infection.  Bleeding.  What happens during the procedure?  1. You will be given one or more of the following: ? A medicine to numb the area (local anesthetic). 2. The area around the opening of your urethra will be cleaned. 3. The cystoscope will be passed through your urethra into your bladder. 4. Germ-free (sterile) fluid will flow through the cystoscope to fill your bladder. The fluid will stretch your bladder so that your health care provider can clearly examine your bladder walls. 5. Your doctor will look at the urethra and bladder. 6. The cystoscope will be removed The procedure may vary among health care providers  What can I expect after the procedure? After the procedure, it is common to have: 1. Some soreness or pain in your abdomen and urethra. 2. Urinary symptoms.  These include: ? Mild pain or burning when you urinate. Pain should stop within a few minutes after you urinate. This may last for up to 1 week. ? A small amount of blood in your urine for several days. ? Feeling like you need to urinate but producing only a small amount of urine. Follow these instructions at home: General instructions  Return to your normal activities as told by your health care provider.   Do not drive for 24 hours if you were given a sedative during your procedure.  Watch for any blood in your urine. If the amount of blood in your urine increases, call your health care provider.  If a tissue sample was removed for testing (biopsy) during your procedure, it is up to you to get your test results. Ask your health care provider, or the department that is doing the test, when your results will be ready.  Drink enough fluid to keep your urine pale yellow.  Keep all follow-up visits as told by your health care provider. This is important. Contact a health care provider if you:  Have pain that gets worse or does not get better with medicine, especially pain when you urinate.  Have trouble urinating.  Have more blood in your urine. Get help right away if you:  Have blood clots in your urine.  Have abdominal pain.  Have a fever or chills.  Are unable to urinate. Summary  Cystoscopy is a procedure that is used to help diagnose and sometimes treat conditions that affect the lower urinary tract.  Cystoscopy is done using   a thin, tube-shaped instrument with a light and camera at the end.  After the procedure, it is common to have some soreness or pain in your abdomen and urethra.  Watch for any blood in your urine. If the amount of blood in your urine increases, call your health care provider.  If you were prescribed an antibiotic medicine, take it as told by your health care provider. Do not stop taking the antibiotic even if you start to feel better. This  information is not intended to replace advice given to you by your health care provider. Make sure you discuss any questions you have with your health care provider. Document Revised: 12/13/2017 Document Reviewed: 12/13/2017 Elsevier Patient Education  2020 Elsevier Inc.   

## 2020-01-31 LAB — CULTURE, URINE COMPREHENSIVE

## 2020-02-05 ENCOUNTER — Other Ambulatory Visit: Payer: Self-pay

## 2020-02-05 ENCOUNTER — Ambulatory Visit
Admission: RE | Admit: 2020-02-05 | Discharge: 2020-02-05 | Disposition: A | Discharge status: Home / Self Care | Payer: 59 | Source: Ambulatory Visit | Attending: Urology | Admitting: Urology

## 2020-02-05 DIAGNOSIS — R1031 Right lower quadrant pain: Secondary | ICD-10-CM | POA: Insufficient documentation

## 2020-02-05 MED ORDER — IOHEXOL 300 MG/ML  SOLN
100.0000 mL | Freq: Once | INTRAMUSCULAR | Status: AC | PRN
  Administered 2020-02-05: 100 mL via INTRAVENOUS

## 2020-02-12 ENCOUNTER — Telehealth: Payer: Self-pay

## 2020-02-12 NOTE — Telephone Encounter (Signed)
Patient called and left a vmail requesting CTscan results. OK to release or will discuss at follow up?

## 2020-02-13 NOTE — Telephone Encounter (Signed)
Yes please

## 2020-02-13 NOTE — Telephone Encounter (Signed)
Patient notified of radiology report impression and was told to keep follow up as scheduled

## 2020-02-26 ENCOUNTER — Ambulatory Visit: Payer: Self-pay | Admitting: Family Medicine

## 2020-03-03 ENCOUNTER — Other Ambulatory Visit: Payer: Self-pay | Admitting: Urology

## 2020-03-10 ENCOUNTER — Other Ambulatory Visit: Payer: Self-pay

## 2020-03-10 ENCOUNTER — Encounter: Payer: Self-pay | Admitting: Urology

## 2020-03-10 ENCOUNTER — Ambulatory Visit (INDEPENDENT_AMBULATORY_CARE_PROVIDER_SITE_OTHER): Payer: 59 | Admitting: Urology

## 2020-03-10 VITALS — BP 121/81 | HR 74 | Ht 65.0 in | Wt 157.0 lb

## 2020-03-10 DIAGNOSIS — R3915 Urgency of urination: Secondary | ICD-10-CM | POA: Diagnosis not present

## 2020-03-10 DIAGNOSIS — R350 Frequency of micturition: Secondary | ICD-10-CM | POA: Diagnosis not present

## 2020-03-10 MED ORDER — OXYBUTYNIN CHLORIDE ER 10 MG PO TB24: 10.0000 mg | ORAL_TABLET | Freq: Every day | ORAL | 11 refills | Status: DC

## 2020-03-10 MED ORDER — SOLIFENACIN SUCCINATE 5 MG PO TABS
5.0000 mg | ORAL_TABLET | Freq: Every day | ORAL | 11 refills | Status: DC
Start: 2020-03-10 — End: 2020-04-16

## 2020-03-10 NOTE — Progress Notes (Signed)
03/10/2020 2:09 PM   Krystal Marks 08-03-1975 [id number]  Referring provider: Maple Hudson., MD [address] [place],  [place] [address]  Chief Complaint  Patient presents with  . Cysto    HPI: I was consulted to assess the patient's mild pelvic discomfort and urgency worse over the last 6 to 12 months.  First thing in the morning she feels a nagging discomfort in the suprapubic area.  It is daily and comes and goes.  It is not relieved by voiding.  No dyspareunia.  She has a history of irritable bowel syndrome and fibromyalgia.  For seeing in the morning she has some vague back discomfort on both sides  Normally she voids every 1-2 hours and sometimes a few times an hour.  She gets up at least once a night.  She is continent  Bladder nonsensitive.  No levator tenderness  Patient does have some frequency and urgency.  At times she almost does not get to the restroom in time.  She has mild nocturia.  She has vague abdominal discomfort or pressure.  The role of a safety CT scan was discussed.  Urine was sent for culture.  The role of an overactive bladder medication cystoscopy was discussed.  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  My index of suspicion is milder but next visit after cystoscopy hydrodistention is a potential option for her to consider for her ongoing but overall milder symptoms.  She understands that her bladder might be inflamed or not.  It will be interesting if her symptoms respond to an overactive bladder medication.  New beta 3 agonist given with samples and co-pay card  Today Frequency stable.  Last urine culture negative.  CT scan negative. New medication did not help her.  Still has varying urgency and frequency affecting her quality life.  When she double voids especially she can have discomfort in the urethra.  She has some vague suprapubic discomfort at times but is more in the urethra.  Cystoscopy: Patient  underwent flexible cystoscopy.  Bladder mucosa and trigone were normal.  No pain with bladder filling.  No cystitis.  No carcinoma.  Tolerated procedure well.  Few white flecks in urine and urine sent for culture   PMH: Past Medical History:  Diagnosis Date  . Acid reflux 08/16/2014  . ADD (attention deficit disorder)   . Alcohol abuse 05/25/2017  . Anxiety   . Anxiety, generalized 08/16/2014  . 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 Benzodiazepine abuse (HCC) 05/25/2017  . Benzodiazepine overdose 05/25/2017  . Cigarette smoker 08/16/2014  . Depression   . Depression, major, single episode, moderate (HCC) 08/16/2014  . Fatigue 01/20/2017  . Fibromyalgia   . Gastroduodenal ulcer 08/16/2014  . GERD (gastroesophageal reflux disease)   . IBS (irritable bowel syndrome)   . Irregular bleeding 08/16/2014  . Migraine 08/16/2014  . Migraine headache    monthly  . 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 BREAST ENHANCEMENT SURGERY  2007  . COLONOSCOPY    . ESOPHAGOGASTRODUODENOSCOPY     x4  . KNEE ARTHROSCOPY WITH PATELLA RECONSTRUCTION Left 07/19/2017   Procedure: KNEE ARTHROSCOPY PARTIAL MEDIAL MENISECTOMY PATELLA CHONDROPLASTY;  Surgeon: Signa Kell, MD;  Location: [facility];  Service: Orthopedics;  Laterality: Left;  . SHOULDER ARTHROSCOPY Right     Home Medications:  Allergies as of 03/10/2020  Reactions   Duloxetine Nausea And Vomiting   CAN NOT TAKE WITH IMITREX    Iodinated Diagnostic Agents Itching   Omnpaque i 300, 10 min delayed reaction of large hives, itching & urticaria// will require 13hr prep for future iv contrast,//a.calhoun   Meperidine    Other reaction(s): Respiratory Distress   Dilaudid [hydromorphone]    Hard times breathing      Medication List       Accurate as of March 10, 2020  2:09 PM. If you have any questions, ask your nurse or  doctor.        amphetamine-dextroamphetamine 20 MG tablet Commonly known as: Adderall Take 1 tablet (20 mg total) by mouth 2 (two) times daily.   amphetamine-dextroamphetamine 20 MG tablet Commonly known as: ADDERALL Take 1 tablet (20 mg total) by mouth 2 (two) times daily as needed.   amphetamine-dextroamphetamine 20 MG tablet Commonly known as: Adderall Take 1 tablet (20 mg total) by mouth 2 (two) times daily.   diphenhydrAMINE 50 MG tablet Commonly known as: BENADRYL Take 1 tablet prior to procedure   ELDERBERRY PO Take by mouth.   folic acid 400 MCG tablet Commonly known as: FOLVITE Take 400 mcg by mouth daily.   HYDROXYZINE HCL PO Take by mouth.   MUCINEX D MAX STRENGTH PO Take 1,200 mg by mouth in the morning and at bedtime.   OVER THE COUNTER MEDICATION Slee Aid gummy-OLLY (magnesium,zinc,Melatonin)   predniSONE 50 MG tablet Commonly known as: DELTASONE Take 1 tablet 13 hrs prior, 1 tablet 7 hrs prior and 1 tablet 1 hr prior to procedure   PROBIOTIC ADVANCED PO Take by mouth.   SUMAtriptan 100 MG tablet Commonly known as: IMITREX Take 1 tablet (100 mg total) by mouth as needed.   Vibegron 75 MG Tabs Take 75 mg by mouth daily.   vitamin B-12 1000 MCG tablet Commonly known as: CYANOCOBALAMIN Take 1,000 mcg by mouth daily. OLLY-brand   VITAMIN D3 PO Take by mouth in the morning and at bedtime.   XYZAL PO Take by mouth daily.       Allergies:  Allergies  Allergen Reactions  . Duloxetine Nausea And Vomiting    CAN NOT TAKE WITH IMITREX   . Iodinated Diagnostic Agents Itching    Omnpaque i 300, 10 min delayed reaction of large hives, itching & urticaria// will require 13hr prep for future iv contrast,//a.calhoun  . Meperidine     Other reaction(s): Respiratory Distress  . Dilaudid [Hydromorphone]     Hard times breathing    Family History: Family History  Problem Relation Age of Onset  . Healthy Mother   . Healthy Father   . Healthy  Brother   . Osteoporosis Maternal Grandmother   . Esophageal cancer Maternal Grandfather   . Colon cancer Neg Hx   . Breast cancer Neg Hx     Social History:  reports that she has been smoking cigarettes. She has a 10.00 pack-year smoking history. She has never used smokeless tobacco. She reports current alcohol use of about 4.0 standard drinks of alcohol per week. She reports that she does not use drugs.  ROS:                                        Physical Exam: BP 121/81   Pulse 74   Ht 5\' 5"  (1.651 m)   Wt 71.2 kg  BMI 26.13 kg/m   Constitutional:  Alert and oriented, No acute distress.  Laboratory Data: Lab Results  Component Value Date   WBC 10.1 09/26/2019   HGB 13.0 09/26/2019   HCT 37.3 09/26/2019   MCV 94 09/26/2019   PLT 308 09/26/2019    Lab Results  Component Value Date   CREATININE 0.67 09/26/2019    No results found for: PSA  No results found for: TESTOSTERONE  No results found for: HGBA1C  Urinalysis    Component Value Date/Time   COLORURINE STRAW (A) 05/25/2017 0210   APPEARANCEUR Cloudy (A) 01/28/2020 0952   LABSPEC 1.003 (L) 05/25/2017 0210   PHURINE 6.0 05/25/2017 0210   GLUCOSEU Negative 01/28/2020 0952   HGBUR SMALL (A) 05/25/2017 0210   BILIRUBINUR Negative 01/28/2020 0952   KETONESUR NEGATIVE 05/25/2017 0210   PROTEINUR Trace (A) 01/28/2020 0952   PROTEINUR NEGATIVE 05/25/2017 0210   UROBILINOGEN 0.2 12/05/2019 1528   UROBILINOGEN 0.2 08/12/2016 1534   NITRITE Negative 01/28/2020 0952   NITRITE NEGATIVE 05/25/2017 0210   LEUKOCYTESUR Negative 01/28/2020 0952    Pertinent Imaging:   Assessment & Plan: Patient understands he could have interstitial cystitis and I went through hydrodistention in detail.  Usual template used.  I thought issues to try to other overactive bladder medications.  We talked about referral to physical therapy independent of diagnosis.  I gave her Vesicare 5 mg 30x11 oxybutynin ER  10 mg 3x11.  Reassess in 8 weeks.  She understood the rationale for possible hydrodistention in the future with no further discussion today  1. Urgency of urination  - Urinalysis, Complete   No follow-ups on file.  Martina Sinner, MD  [facility] [address], [address] [place], [place] [address] [phone]

## 2020-03-12 LAB — URINALYSIS, COMPLETE
Bilirubin, UA: NEGATIVE
Ketones, UA: NEGATIVE
Leukocytes,UA: NEGATIVE
Nitrite, UA: NEGATIVE
Protein,UA: NEGATIVE
Specific Gravity, UA: 1.025 (ref 1.005–1.030)
Urobilinogen, Ur: 0.2 mg/dL (ref 0.2–1.0)
pH, UA: 5.5 (ref 5.0–7.5)

## 2020-03-12 LAB — MICROSCOPIC EXAMINATION

## 2020-03-12 LAB — CULTURE, URINE COMPREHENSIVE

## 2020-04-01 NOTE — Progress Notes (Signed)
This encounter was created in error - please disregard.

## 2020-04-16 ENCOUNTER — Encounter: Payer: Self-pay | Admitting: Family Medicine

## 2020-04-16 ENCOUNTER — Other Ambulatory Visit: Payer: Self-pay

## 2020-04-16 ENCOUNTER — Ambulatory Visit (INDEPENDENT_AMBULATORY_CARE_PROVIDER_SITE_OTHER): Payer: 59 | Admitting: Family Medicine

## 2020-04-16 DIAGNOSIS — F902 Attention-deficit hyperactivity disorder, combined type: Secondary | ICD-10-CM | POA: Diagnosis not present

## 2020-04-16 MED ORDER — AMPHETAMINE-DEXTROAMPHETAMINE 20 MG PO TABS: 20.0000 mg | ORAL_TABLET | Freq: Two times a day (BID) | ORAL | 0 refills | Status: DC

## 2020-04-16 MED ORDER — AMPHETAMINE-DEXTROAMPHETAMINE 20 MG PO TABS: 20.0000 mg | ORAL_TABLET | Freq: Two times a day (BID) | ORAL | 0 refills | Status: DC | PRN

## 2020-04-16 NOTE — Progress Notes (Signed)
I,Roshena L Chambers,acting as a scribe for Megan Mans, MD.,have documented all relevant documentation on the behalf of Megan Mans, MD,as directed by  Megan Mans, MD while in the presence of Megan Mans, MD.  Established patient visit   Patient: Krystal Marks   DOB: [birth year]   45 y.o. Female  MRN: [id number] Visit Date: 04/16/2020  Today's healthcare provider: Megan Mans, MD   Chief Complaint  Patient presents with  . 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 She is now working almost 7 days a week and requires Adderall almost daily.  On her days off she has not taken it.  He has no complaints today. Follow up for ADHD  The patient was last seen for this 3 months ago. Changes made at last visit include no medication changes. Refilled Adderall.   She reports good compliance with treatment. She feels that condition is Unchanged. Patient has been taking Adderall every days since she now works 7 days a week.  She is not having side effects.   -----------------------------------------------------------------------------------------        Medications: Outpatient Medications Prior to Visit  Medication Sig  . amphetamine-dextroamphetamine (ADDERALL) 20 MG tablet Take 1 tablet (20 mg total) by mouth 2 (two) times daily.  Marland Kitchen amphetamine-dextroamphetamine (ADDERALL) 20 MG tablet Take 1 tablet (20 mg total) by mouth 2 (two) times daily as needed.  Marland Kitchen amphetamine-dextroamphetamine (ADDERALL) 20 MG tablet Take 1 tablet (20 mg total) by mouth 2 (two) times daily.  . Cholecalciferol (VITAMIN D3 PO) Take by mouth in the morning and at bedtime.  Marland Kitchen ELDERBERRY PO Take by mouth.  . folic acid (FOLVITE) 400 MCG tablet Take 400 mcg by mouth daily.  Marland Kitchen HYDROXYZINE HCL PO Take by mouth.  . Levocetirizine Dihydrochloride (XYZAL PO) Take by mouth daily.  Marland Kitchen OVER THE  COUNTER MEDICATION Slee Aid gummy-OLLY (magnesium,zinc,Melatonin)  . oxybutynin (DITROPAN-XL) 10 MG 24 hr tablet Take 1 tablet (10 mg total) by mouth daily.  . Probiotic Product (PROBIOTIC ADVANCED PO) Take by mouth.  . Pseudoephedrine-guaiFENesin (MUCINEX D MAX STRENGTH PO) Take 1,200 mg by mouth in the morning and at bedtime.  . SUMAtriptan (IMITREX) 100 MG tablet Take 1 tablet (100 mg total) by mouth as needed.  . vitamin B-12 (CYANOCOBALAMIN) 1000 MCG tablet Take 1,000 mcg by mouth daily. OLLY-brand  . [DISCONTINUED] diphenhydrAMINE (BENADRYL) 50 MG tablet Take 1 tablet prior to procedure (Patient not taking: Reported on 03/10/2020)  . [DISCONTINUED] predniSONE (DELTASONE) 50 MG tablet Take 1 tablet 13 hrs prior, 1 tablet 7 hrs prior and 1 tablet 1 hr prior to procedure (Patient not taking: Reported on 03/10/2020)  . [DISCONTINUED] solifenacin (VESICARE) 5 MG tablet Take 1 tablet (5 mg total) by mouth daily.  . [DISCONTINUED] Vibegron 75 MG TABS Take 75 mg by mouth daily. (Patient not taking: Reported on 04/16/2020)   No facility-administered medications prior to visit.    Review of Systems  Constitutional: Negative for activity change and fatigue.  Respiratory: Negative for cough and shortness of breath.   Cardiovascular: Negative for chest pain, palpitations and leg swelling.  Neurological: Negative for dizziness, light-headedness and headaches.  Psychiatric/Behavioral: Negative for agitation, self-injury, sleep disturbance and suicidal ideas. The patient is not nervous/anxious and is not hyperactive.         Objective    BP 105/71 (  BP Location: Right Arm, Patient Position: Sitting, Cuff Size: Large)   Pulse 60   Temp 98.1 F (36.7 C) (Temporal)   Resp 16   Wt 159 lb (72.1 kg)   LMP 03/24/2020   BMI 26.46 kg/m  Wt Readings from Last 3 Encounters:  04/16/20 159 lb (72.1 kg)  03/10/20 157 lb (71.2 kg)  01/28/20 157 lb (71.2 kg)       Physical Exam Vitals reviewed.   Constitutional:      General: She is not in acute distress.    Appearance: Normal appearance. She is well-developed. She is not diaphoretic.  HENT:     Head: Normocephalic and atraumatic.  Eyes:     General: No scleral icterus.    Conjunctiva/sclera: Conjunctivae normal.  Neck:     Thyroid: No thyromegaly.  Cardiovascular:     Rate and Rhythm: Normal rate and regular rhythm.     Pulses: Normal pulses.     Heart sounds: Normal heart sounds. No murmur heard.   Pulmonary:     Effort: Pulmonary effort is normal. No respiratory distress.     Breath sounds: Normal breath sounds. No wheezing, rhonchi or rales.  Abdominal:     General: Abdomen is flat.     Palpations: Abdomen is soft.  Musculoskeletal:     Cervical back: Neck supple.     Right lower leg: No edema.     Left lower leg: No edema.  Lymphadenopathy:     Cervical: No cervical adenopathy.  Skin:    General: Skin is warm and dry.     Findings: No rash.  Neurological:     Mental Status: She is alert and oriented to person, place, and time. Mental status is at baseline.  Psychiatric:        Mood and Affect: Mood normal.        Behavior: Behavior normal.        Thought Content: Thought content normal.        Judgment: Judgment normal.       No results found for any visits on 04/16/20.  Assessment & Plan     1. Attention deficit hyperactivity disorder (ADHD), combined type Refill Adderall x3 with printed prescription.  Follow-up in a few months. - amphetamine-dextroamphetamine (ADDERALL) 20 MG tablet; Take 1 tablet (20 mg total) by mouth 2 (two) times daily.  Dispense: 60 tablet; Refill: 0 - amphetamine-dextroamphetamine (ADDERALL) 20 MG tablet; Take 1 tablet (20 mg total) by mouth 2 (two) times daily as needed.  Dispense: 60 tablet; Refill: 0 - amphetamine-dextroamphetamine (ADDERALL) 20 MG tablet; Take 1 tablet (20 mg total) by mouth 2 (two) times daily.  Dispense: 60 tablet; Refill: 0  No follow-ups on file.       I, Megan Mans, MD, have reviewed all documentation for this visit. The documentation on 04/23/20 for the exam, diagnosis, procedures, and orders are all accurate and complete.    Brailee Riede Wendelyn Breslow, MD  [facility] [phone] (phone) [phone] (fax)  [facility]

## 2020-04-22 ENCOUNTER — Ambulatory Visit: Payer: Self-pay | Admitting: Family Medicine

## 2020-05-05 ENCOUNTER — Ambulatory Visit: Payer: Self-pay | Admitting: Urology

## 2020-05-09 ENCOUNTER — Encounter: Payer: Self-pay | Admitting: Urology

## 2020-05-28 ENCOUNTER — Other Ambulatory Visit: Payer: Self-pay

## 2020-05-28 DIAGNOSIS — F902 Attention-deficit hyperactivity disorder, combined type: Secondary | ICD-10-CM

## 2020-05-28 MED ORDER — AMPHETAMINE-DEXTROAMPHETAMINE 20 MG PO TABS: 20.0000 mg | ORAL_TABLET | Freq: Two times a day (BID) | ORAL | 0 refills | Status: DC | PRN

## 2020-05-28 MED ORDER — AMPHETAMINE-DEXTROAMPHETAMINE 20 MG PO TABS: 20.0000 mg | ORAL_TABLET | Freq: Two times a day (BID) | ORAL | 0 refills | Status: DC

## 2020-05-28 NOTE — Telephone Encounter (Signed)
Patient here needing adderall rx updated. Pharmacy will not fill because of recurrent date listed.

## 2020-06-19 ENCOUNTER — Ambulatory Visit: Payer: 59 | Admitting: Family Medicine

## 2020-07-15 ENCOUNTER — Other Ambulatory Visit: Payer: Self-pay

## 2020-07-15 ENCOUNTER — Ambulatory Visit (INDEPENDENT_AMBULATORY_CARE_PROVIDER_SITE_OTHER): Payer: 59 | Admitting: Family Medicine

## 2020-07-15 ENCOUNTER — Encounter: Payer: Self-pay | Admitting: Family Medicine

## 2020-07-15 VITALS — BP 121/62 | HR 70 | Temp 99.2°F | Resp 16 | Ht 65.0 in | Wt 161.0 lb

## 2020-07-15 DIAGNOSIS — M545 Low back pain, unspecified: Secondary | ICD-10-CM | POA: Diagnosis not present

## 2020-07-15 DIAGNOSIS — G5601 Carpal tunnel syndrome, right upper limb: Secondary | ICD-10-CM

## 2020-07-15 DIAGNOSIS — F902 Attention-deficit hyperactivity disorder, combined type: Secondary | ICD-10-CM

## 2020-07-15 MED ORDER — AMPHETAMINE-DEXTROAMPHETAMINE 20 MG PO TABS: 20.0000 mg | ORAL_TABLET | Freq: Two times a day (BID) | ORAL | 0 refills | Status: DC | PRN

## 2020-07-15 MED ORDER — AMPHETAMINE-DEXTROAMPHETAMINE 20 MG PO TABS: 20.0000 mg | ORAL_TABLET | Freq: Two times a day (BID) | ORAL | 0 refills | Status: DC

## 2020-07-15 NOTE — Progress Notes (Signed)
Established patient visit   Patient: Krystal Marks   DOB: 1975-07-08   45 y.o. Female  MRN: [id number] Visit Date: 07/15/2020  Today's healthcare provider: Megan Mans, MD   Chief Complaint  Patient presents with   Follow-up   Back Pain   numbness and tingling in hands   Subjective    HPI  Patient feels well and is getting married in a few weeks.  She has some ongoing right low back pain which has bothered her some.  No radicular symptoms. She is doing well with her ADD. 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 She is having symptoms in her right fingers that she thinks is carpal tunnel syndrome Follow up for ADD  The patient was last seen for this 3 months ago. Changes made at last visit include no medication changes.  She reports good compliance with treatment. She feels that condition is Unchanged. She is not having side effects.   Patient also mentions that she is having lower back pain that is constant. She is also experiencing numbness and tingling sensation in her fingertips on her right hand.       Medications: Outpatient Medications Prior to Visit  Medication Sig   amphetamine-dextroamphetamine (ADDERALL) 20 MG tablet Take 1 tablet (20 mg total) by mouth 2 (two) times daily.   amphetamine-dextroamphetamine (ADDERALL) 20 MG tablet Take 1 tablet (20 mg total) by mouth 2 (two) times daily.   amphetamine-dextroamphetamine (ADDERALL) 20 MG tablet Take 1 tablet (20 mg total) by mouth 2 (two) times daily as needed.   Cholecalciferol (VITAMIN D3 PO) Take by mouth in the morning and at bedtime.   ELDERBERRY PO Take by mouth.   folic acid (FOLVITE) 400 MCG tablet Take 400 mcg by mouth daily.   HYDROXYZINE HCL PO Take by mouth.   Levocetirizine Dihydrochloride (XYZAL PO) Take by mouth daily.   OVER THE COUNTER MEDICATION Slee Aid gummy-OLLY (magnesium,zinc,Melatonin)   oxybutynin (DITROPAN-XL) 10 MG 24 hr tablet Take 1 tablet (10 mg total) by mouth daily.    Probiotic Product (PROBIOTIC ADVANCED PO) Take by mouth.   Pseudoephedrine-guaiFENesin (MUCINEX D MAX STRENGTH PO) Take 1,200 mg by mouth in the morning and at bedtime.   SUMAtriptan (IMITREX) 100 MG tablet Take 1 tablet (100 mg total) by mouth as needed.   vitamin B-12 (CYANOCOBALAMIN) 1000 MCG tablet Take 1,000 mcg by mouth daily. OLLY-brand   No facility-administered medications prior to visit.    Review of Systems  Constitutional:  Positive for fatigue. Negative for activity change.  Cardiovascular:  Negative for chest pain, palpitations and leg swelling.  Musculoskeletal:  Positive for back pain.  Neurological:  Positive for numbness.  Psychiatric/Behavioral:  Negative for self-injury, sleep disturbance and suicidal ideas. The patient is not nervous/anxious.        Objective    BP 121/62   Pulse 70   Temp 99.2 F (37.3 C)   Resp 16   Ht 5\' 5"  (1.651 m)   Wt 161 lb (73 kg)   BMI 26.79 kg/m  BP Readings from Last 3 Encounters:  07/15/20 121/62  04/16/20 105/71  03/10/20 121/81   Wt Readings from Last 3 Encounters:  07/15/20 161 lb (73 kg)  04/16/20 159 lb (72.1 kg)  03/10/20 157 lb (71.2 kg)       Physical Exam Vitals reviewed.  Constitutional:      General: She is not in acute distress.    Appearance: Normal appearance. She is well-developed.  She is not diaphoretic.  HENT:     Head: Normocephalic and atraumatic.  Eyes:     General: No scleral icterus.    Conjunctiva/sclera: Conjunctivae normal.  Neck:     Thyroid: No thyromegaly.  Cardiovascular:     Rate and Rhythm: Normal rate and regular rhythm.     Pulses: Normal pulses.     Heart sounds: Normal heart sounds. No murmur heard. Pulmonary:     Effort: Pulmonary effort is normal. No respiratory distress.     Breath sounds: Normal breath sounds. No wheezing, rhonchi or rales.  Abdominal:     General: Abdomen is flat.     Palpations: Abdomen is soft.  Musculoskeletal:     Cervical back: Neck supple.      Right lower leg: No edema.     Left lower leg: No edema.     Comments: No tenderness along the spine.  Negative straight leg raise.  Pain is in the area the right SI joint.  Lymphadenopathy:     Cervical: No cervical adenopathy.  Skin:    General: Skin is warm and dry.     Findings: No rash.  Neurological:     Mental Status: She is alert and oriented to person, place, and time. Mental status is at baseline.     Comments: Negative Tinel sign.  Psychiatric:        Mood and Affect: Mood normal.        Behavior: Behavior normal.        Thought Content: Thought content normal.        Judgment: Judgment normal.      No results found for any visits on 07/15/20.  Assessment & Plan     1. Attention deficit hyperactivity disorder (ADHD), combined type Follow-up 3 months for refills - amphetamine-dextroamphetamine (ADDERALL) 20 MG tablet; Take 1 tablet (20 mg total) by mouth 2 (two) times daily.  Dispense: 60 tablet; Refill: 0 - amphetamine-dextroamphetamine (ADDERALL) 20 MG tablet; Take 1 tablet (20 mg total) by mouth 2 (two) times daily.  Dispense: 60 tablet; Refill: 0 - amphetamine-dextroamphetamine (ADDERALL) 20 MG tablet; Take 1 tablet (20 mg total) by mouth 2 (two) times daily as needed.  Dispense: 60 tablet; Refill: 0  2. Acute right-sided low back pain, unspecified whether sciatica present I think this is mechanical low back pain.  Sitter PT referral.  Patient has some exercises she is done from previous PT referral  3. Carpal tunnel syndrome of right wrist Wear a cock-up wrist splint on the right.  May need referral to hand surgeon although not necessary at this time.  Do not even think she needs a nonsteroidal at this time.   No follow-ups on file.      I, Megan Mans, MD, have reviewed all documentation for this visit. The documentation on 07/19/20 for the exam, diagnosis, procedures, and orders are all accurate and complete.    Karsyn Rochin Wendelyn Breslow, MD  [facility] [phone] (phone) [phone] (fax)  [facility]

## 2020-07-15 NOTE — Patient Instructions (Signed)
Wear cock up wrist splint for wrist pain.

## 2020-09-01 ENCOUNTER — Encounter: Payer: Self-pay | Admitting: Family Medicine

## 2020-10-14 ENCOUNTER — Ambulatory Visit: Payer: Self-pay | Admitting: Family Medicine

## 2020-11-05 ENCOUNTER — Ambulatory Visit: Payer: 59 | Admitting: Family Medicine

## 2020-11-05 NOTE — Progress Notes (Deleted)
      Established patient visit   Patient: Krystal Marks   DOB: April 30, 1975   45 y.o. Female  MRN: [id number] Visit Date: 11/05/2020  Today's healthcare provider: Megan Mans, MD   No chief complaint on file.  Subjective    HPI  Patient is a 45 year old female who presents for follow up of ADD and to get refills on her medication.  She was last seen on 07/15/2020.  There were no medication changes at that time and patient reports good compliance and tolerance of the medication.   {Link to patient history deactivated due to formatting error:1}  Medications: Outpatient Medications Prior to Visit  Medication Sig   amphetamine-dextroamphetamine (ADDERALL) 20 MG tablet Take 1 tablet (20 mg total) by mouth 2 (two) times daily.   amphetamine-dextroamphetamine (ADDERALL) 20 MG tablet Take 1 tablet (20 mg total) by mouth 2 (two) times daily.   amphetamine-dextroamphetamine (ADDERALL) 20 MG tablet Take 1 tablet (20 mg total) by mouth 2 (two) times daily as needed.   Cholecalciferol (VITAMIN D3 PO) Take by mouth in the morning and at bedtime.   ELDERBERRY PO Take by mouth.   folic acid (FOLVITE) 400 MCG tablet Take 400 mcg by mouth daily.   HYDROXYZINE HCL PO Take by mouth.   Levocetirizine Dihydrochloride (XYZAL PO) Take by mouth daily.   OVER THE COUNTER MEDICATION Slee Aid gummy-OLLY (magnesium,zinc,Melatonin)   oxybutynin (DITROPAN-XL) 10 MG 24 hr tablet Take 1 tablet (10 mg total) by mouth daily.   Probiotic Product (PROBIOTIC ADVANCED PO) Take by mouth.   Pseudoephedrine-guaiFENesin (MUCINEX D MAX STRENGTH PO) Take 1,200 mg by mouth in the morning and at bedtime.   SUMAtriptan (IMITREX) 100 MG tablet Take 1 tablet (100 mg total) by mouth as needed.   vitamin B-12 (CYANOCOBALAMIN) 1000 MCG tablet Take 1,000 mcg by mouth daily. OLLY-brand   No facility-administered medications prior to visit.    Review of Systems  {Labs  Heme  Chem  Endocrine  Serology  Results Review  (optional):23779}   Objective    There were no vitals taken for this visit. {Show previous vital signs (optional):23777}  Physical Exam  ***  No results found for any visits on 11/05/20.  Assessment & Plan     ***  No follow-ups on file.      {provider attestation***:1}   Megan Mans, MD  [facility] [phone] (phone) [phone] (fax)  [facility]

## 2021-02-06 ENCOUNTER — Other Ambulatory Visit: Payer: Self-pay

## 2021-02-06 ENCOUNTER — Encounter: Payer: Self-pay | Admitting: Physician Assistant

## 2021-02-06 ENCOUNTER — Ambulatory Visit (INDEPENDENT_AMBULATORY_CARE_PROVIDER_SITE_OTHER): Payer: Managed Care, Other (non HMO) | Admitting: Physician Assistant

## 2021-02-06 VITALS — BP 115/73 | HR 76 | Temp 98.7°F | Resp 16 | Wt 168.0 lb

## 2021-02-06 DIAGNOSIS — F900 Attention-deficit hyperactivity disorder, predominantly inattentive type: Secondary | ICD-10-CM

## 2021-02-06 DIAGNOSIS — Z0189 Encounter for other specified special examinations: Secondary | ICD-10-CM

## 2021-02-06 DIAGNOSIS — F902 Attention-deficit hyperactivity disorder, combined type: Secondary | ICD-10-CM | POA: Diagnosis not present

## 2021-02-06 MED ORDER — AMPHETAMINE-DEXTROAMPHETAMINE 20 MG PO TABS: 20.0000 mg | ORAL_TABLET | Freq: Every day | ORAL | 0 refills | Status: DC

## 2021-02-06 NOTE — Assessment & Plan Note (Signed)
Chronic, historic condition, stable and managed with Adderall Patient is taking 20mg  one to two times per day as needed to manage symptoms Potentially may need adjustment to low dose Adderall XR to provide longer coverage  For now continue medications as prescribed

## 2021-02-06 NOTE — Progress Notes (Signed)
Established patient visit  I,[name],acting as a scribe for [employer], PA-C.,have documented all relevant documentation on the behalf of [name] E [name], PA-C,as directed by  Denny Peon E Abhijot Straughter, PA-C while in the presence of Clancey Welton E Treston Coker, PA-C.   Patient: Krystal Marks   DOB: 1976/01/03   46 y.o. Female  MRN: [id number] Visit Date: 02/06/2021  Today's healthcare provider: Oswaldo Conroy Bonnetta Allbee, PA-C  Introduced myself to the patient as a Secondary school teacher and provided education on APPs in clinical practice.    Chief Complaint  Patient presents with   Follow-up   ADHD   Subjective    HPI  Follow up for Attention deficit hyperactivity disorder (ADHD), combined type  The patient was last seen for this 7 months ago. Changes made at last visit include; given refills for Adderall 20 mg bid. Advised to follow up in 3 months.  She reports good compliance with treatment. She feels that condition is Unchanged. She is not having side effects. none  ----------------------------------------------------------------------------------------- States she is having to take one to two per day but is having to take two more frequently  Reports she had insomnia with Adderall 30mg  XR in the past  States she is taking them in the morning around 9:30- 10am but sometimes has to take another half tablet to feel effects  Gets about 4 solid hours of medication's effects before needing to take second dose if she has a busy day Does not report concerns for anorexia States she thinks she is having some crashes in the evening with dizziness, seeing flashes, fatigue in the evenings   Medications: Outpatient Medications Prior to Visit  Medication Sig   [DISCONTINUED] amphetamine-dextroamphetamine (ADDERALL) 20 MG tablet Take 1 tablet (20 mg total) by mouth 2 (two) times daily.   [DISCONTINUED] amphetamine-dextroamphetamine (ADDERALL) 20 MG tablet Take 1 tablet (20 mg total) by mouth 2 (two) times daily.    [DISCONTINUED] amphetamine-dextroamphetamine (ADDERALL) 20 MG tablet Take 1 tablet (20 mg total) by mouth 2 (two) times daily as needed.   HYDROXYZINE HCL PO Take by mouth. (Patient not taking: Reported on 02/06/2021)   [DISCONTINUED] Cholecalciferol (VITAMIN D3 PO) Take by mouth in the morning and at bedtime. (Patient not taking: Reported on 02/06/2021)   [DISCONTINUED] ELDERBERRY PO Take by mouth. (Patient not taking: Reported on 02/06/2021)   [DISCONTINUED] folic acid (FOLVITE) 400 MCG tablet Take 400 mcg by mouth daily. (Patient not taking: Reported on 02/06/2021)   [DISCONTINUED] Levocetirizine Dihydrochloride (XYZAL PO) Take by mouth daily. (Patient not taking: Reported on 02/06/2021)   [DISCONTINUED] OVER THE COUNTER MEDICATION Slee Aid gummy-OLLY (magnesium,zinc,Melatonin) (Patient not taking: Reported on 02/06/2021)   [DISCONTINUED] oxybutynin (DITROPAN-XL) 10 MG 24 hr tablet Take 1 tablet (10 mg total) by mouth daily. (Patient not taking: Reported on 02/06/2021)   [DISCONTINUED] Probiotic Product (PROBIOTIC ADVANCED PO) Take by mouth. (Patient not taking: Reported on 02/06/2021)   [DISCONTINUED] Pseudoephedrine-guaiFENesin (MUCINEX D MAX STRENGTH PO) Take 1,200 mg by mouth in the morning and at bedtime. (Patient not taking: Reported on 02/06/2021)   [DISCONTINUED] SUMAtriptan (IMITREX) 100 MG tablet Take 1 tablet (100 mg total) by mouth as needed. (Patient not taking: Reported on 02/06/2021)   [DISCONTINUED] vitamin B-12 (CYANOCOBALAMIN) 1000 MCG tablet Take 1,000 mcg by mouth daily. OLLY-brand (Patient not taking: Reported on 02/06/2021)   No facility-administered medications prior to visit.    Review of Systems  Constitutional:  Negative for appetite change, chills, fatigue and fever.  Respiratory:  Negative  for chest tightness and shortness of breath.   Cardiovascular:  Negative for chest pain and palpitations.  Gastrointestinal:  Negative for abdominal pain, diarrhea, nausea and vomiting.  Neurological:   Negative for dizziness and weakness.  Psychiatric/Behavioral:  Negative for agitation, behavioral problems, decreased concentration, hallucinations and sleep disturbance.       Objective    BP 115/73 (BP Location: Right Arm, Patient Position: Sitting, Cuff Size: Large)    Pulse 76    Temp 98.7 F (37.1 C) (Temporal)    Resp 16    Wt 168 lb (76.2 kg)    SpO2 99%    BMI 27.96 kg/m  {Show previous vital signs (optional):23777}  Physical Exam Vitals reviewed.  Constitutional:      Appearance: Normal appearance.  HENT:     Head: Normocephalic and atraumatic.  Cardiovascular:     Rate and Rhythm: Normal rate and regular rhythm.     Pulses: Normal pulses.     Heart sounds: Normal heart sounds.  Musculoskeletal:     Right lower leg: No edema.     Left lower leg: No edema.  Neurological:     General: No focal deficit present.     Mental Status: She is alert and oriented to person, place, and time.     Coordination: Coordination is intact.     Gait: Gait is intact.  Psychiatric:        Attention and Perception: Perception normal. She is inattentive.        Mood and Affect: Mood and affect normal.        Speech: Speech normal.        Behavior: Behavior normal. Behavior is cooperative.        Thought Content: Thought content normal.        Cognition and Memory: Cognition normal.        Judgment: Judgment normal.     Comments: Mild tangential, inattentive nature but easily redirectable      No results found for any visits on 02/06/21.  Assessment & Plan      Problem List Items Addressed This Visit       Other   ADD (attention deficit disorder) - Primary   Relevant Medications   amphetamine-dextroamphetamine (ADDERALL) 20 MG tablet   Attention deficit hyperactivity disorder, predominantly inattentive type    Chronic, historic condition, stable and managed with Adderall Patient is taking 20mg  one to two times per day as needed to manage symptoms Potentially may need  adjustment to low dose Adderall XR to provide longer coverage  For now continue medications as prescribed       Other Visit Diagnoses     Routine lab draw       Relevant Orders   Comprehensive Metabolic Panel (CMET)   Lipid Profile   CBC w/Diff/Platelet        No follow-ups on file.   I, Suvan Stcyr E Kaelin Bonelli, PA-C, have reviewed all documentation for this visit. The documentation on 02/06/21 for the exam, diagnosis, procedures, and orders are all accurate and complete.   Krystal Marks, 04/06/21 MPH [facility]        [facility]  [facility] [phone] (phone) [phone] (fax)  [facility]

## 2021-02-11 ENCOUNTER — Encounter: Payer: Self-pay | Admitting: Physician Assistant

## 2021-03-27 ENCOUNTER — Other Ambulatory Visit: Payer: Self-pay | Admitting: Family Medicine

## 2021-03-27 ENCOUNTER — Encounter: Payer: Self-pay | Admitting: Physician Assistant

## 2021-03-27 ENCOUNTER — Ambulatory Visit: Payer: Self-pay

## 2021-03-27 ENCOUNTER — Other Ambulatory Visit: Payer: Self-pay

## 2021-03-27 ENCOUNTER — Ambulatory Visit: Payer: Managed Care, Other (non HMO) | Admitting: Physician Assistant

## 2021-03-27 VITALS — BP 131/64 | HR 72 | Temp 99.2°F | Wt 168.0 lb

## 2021-03-27 DIAGNOSIS — F17210 Nicotine dependence, cigarettes, uncomplicated: Secondary | ICD-10-CM

## 2021-03-27 DIAGNOSIS — M79672 Pain in left foot: Secondary | ICD-10-CM | POA: Diagnosis not present

## 2021-03-27 DIAGNOSIS — F902 Attention-deficit hyperactivity disorder, combined type: Secondary | ICD-10-CM

## 2021-03-27 DIAGNOSIS — R030 Elevated blood-pressure reading, without diagnosis of hypertension: Secondary | ICD-10-CM | POA: Diagnosis not present

## 2021-03-27 MED ORDER — PREDNISONE 20 MG PO TABS: 20.0000 mg | ORAL_TABLET | Freq: Every day | ORAL | 0 refills | Status: DC

## 2021-03-27 NOTE — Progress Notes (Signed)
?  ? ? ?I,Tyjae Shvartsman Robinson,acting as a Neurosurgeon for [employer], PA-C.,have documented all relevant documentation on the behalf of Debera Lat, PA-C,as directed by  [employer], PA-C while in the presence of [employer], PA-C.  ? ?Established patient visit ? ? ?Patient: Krystal Marks   DOB: Jul 09, 1975   46 y.o. Female  MRN: [id number] ?Visit Date: 03/27/2021 ? ?Today's healthcare provider: Debera Lat, PA-C  ? ?Patient presents with c/o left foot pain ? ?Subjective  ?  ?Pt presents for left foot pain /top of foot with mild swelling.  Reports onset x 3 weeks ago at least and worsening. Reports she has started wearing flats recently and this is tendon related. 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 Developed after wearing shoes that fit her poorly. Pain with pulling foot upward ? ?FOOT PAIN ?Duration: weeks  ?Involved foot: left ?Mechanism of injury: none  ?Location: Left top ?Onset: gradual  ?Severity: 3/10  ?Quality:  sharp  ?Frequency: constant ?Radiation: no ?Aggravated by: going barefoot  ?Alleviating factors: icing and Ibuprofen with slight relief.   ?Status: worse  ?Treatments attempted: ice and Ibuprofen, wearing tennis shoes  ?Relief with NSAIDs?:  mild ?Weakness with weight bearing or walking: no ?Morning stiffness: no ?Swelling: yes ?Redness: no ?Bruising: no ?Paresthesias / decreased sensation: no  ?Fevers:no ? ?Medications: ?Outpatient Medications Prior to Visit  ?Medication Sig  ? amphetamine-dextroamphetamine (ADDERALL) 20 MG tablet Take 1-2 tablets (20-40 mg total) by mouth daily.  ? HYDROXYZINE HCL PO Take by mouth.  ? ?No facility-administered medications prior to visit.  ? ? ?Review of Systems  ?Constitutional: Negative.   ?HENT: Negative.    ?Respiratory: Negative.    ?Cardiovascular: Negative.   ?Gastrointestinal: Negative.   ?All other systems reviewed and are negative. ?Except see HPI ? ?  Objective  ?  ?BP 131/64 (BP Location: Right Arm, Patient  Position: Sitting, Cuff Size: Normal)   Pulse 72   Temp 99.2 ?F (37.3 ?C) (Oral)   Wt 168 lb (76.2 kg)   SpO2 100%   BMI 27.96 kg/m?  ? ? ?Physical Exam ?Vitals and nursing note reviewed.  ?Constitutional:   ?   Appearance: Normal appearance.  ?Cardiovascular:  ?   Rate and Rhythm: Normal rate.  ?   Pulses: Normal pulses.  ?Pulmonary:  ?   Effort: Pulmonary effort is normal.  ?Abdominal:  ?   General: Abdomen is flat.  ?Musculoskeletal:     ?   General: Swelling and tenderness present.  ?   Comments: Swelling, and tenderness over the dorsum of the midfoot noted ?Pain with dorsiflexion, extension of toes ?  ?Skin: ?   General: Skin is warm.  ?   Findings: No erythema or rash.  ?Neurological:  ?   General: No focal deficit present.  ?   Mental Status: She is alert and oriented to person, place, and time.  ?Psychiatric:     ?   Behavior: Behavior normal.     ?   Thought Content: Thought content normal.     ?   Judgment: Judgment normal.  ?  ? ?No results found for any visits on 03/27/21. ? Assessment & Plan  ?  ? ?1. Midfoot pain, left ?Suspected 2/2 extensor tendinitis. Low grade fever -  37.3 C ?- Prednisone 20 mg x 5 days ?- Rest, use ice&compression and elevate. Use OTC acetaminophen for pain control. She cannot use NSAIDs due to acid reflux ?-  Ambulatory referral to Physical Therapy/prefers [facility] in [place] ?- If symptoms worsen, might order additional labs, imaging or refer to Ortho ? ?2. Elevated BP without diagnosis of hypertension ?BP today is 131/64, historically, WNL ?- Advised to measure her blood pressure at home and bring BP log to the next appt. ?- BMI 27.96. Advised on diet and exercise(swimming) ? ?3. Smoking cessation ?  ?Smoking cessation instruction/counseling given:  counseled patient on the dangers of tobacco use, advised patient to stop smoking, and reviewed strategies to maximize success ?In the past , she took Wellbutrin and nicotine patches. She might willing to try them  again ? ?Fu with me in 1 week.  ?Transition to care of her PCP, Dr. Sullivan Lone ?   ?The patient was advised to call back or seek an in-person evaluation if the symptoms worsen or if the condition fails to improve as anticipated. ? ?I discussed the assessment and treatment plan with the patient. The patient was provided an opportunity to ask questions and all were answered. The patient agreed with the plan and demonstrated an understanding of the instructions. ? ?The entirety of the information documented in the History of Present Illness, Review of Systems and Physical Exam were personally obtained by me. Portions of this information were initially documented by the CMA and reviewed by me for thoroughness and accuracy.   ? ? ?Debera Lat, PA-C  ?[facility] ?[phone] (phone) ?[phone] (fax) ? ?[facility] ?

## 2021-03-27 NOTE — Telephone Encounter (Signed)
Medication Refill - Medication: amphetamine-dextroamphetamine (ADDERALL) 20 MG tablet ? ?Has the patient contacted their pharmacy? No. ?(Agent: If no, request that the patient contact the pharmacy for the refill. If patient does not wish to contact the pharmacy document the reason why and proceed with request.) ?(Agent: If yes, when and what did the pharmacy advise?) ? ?Preferred Pharmacy (with phone number or street name): [id number] - [place] [place] - [address]  ?[address] [place], [place] [address]  ?Phone:  [phone]  Fax:  [phone] ?Has the patient been seen for an appointment in the last year OR does the patient have an upcoming appointment? Yes.   ? ?Agent: Please be advised that RX refills may take up to 3 business days. We ask that you follow-up with your pharmacy. ?

## 2021-03-27 NOTE — Telephone Encounter (Signed)
?  Chief Complaint: Left foot pain ?Symptoms: Pain mild swelling ?Frequency: 3 weeks ?Pertinent Negatives: Patient denies Redness fever, warmth, weeping ?Disposition: [] ED /[] Urgent Care (no appt availability in office) / [x] Appointment(In office/virtual)/ []  [facility]/ [] Home Care/ [] Refused Recommended Disposition /[] [facility]/ []  Follow-up with PCP ?Additional Notes: Pt states that pain started about 3 weeks ago and is increasing 2-5/10. Pt states that she has started wearing flats recently. Pt feels that this is tendon related - has high arches. ? ? ? ?Reason for Disposition ? [1] MODERATE pain (e.g., interferes with normal activities, limping) AND [2] present > 3 days ? ?Answer Assessment - Initial Assessment Questions ?1. ONSET: "When did the pain start?"  ?    3 weeks ago ?2. LOCATION: "Where is the pain located?"  ?    Foot and ankle ?3. PAIN: "How bad is the pain?"    (Scale 1-10; or mild, moderate, severe) ? - MILD (1-3): doesn't interfere with normal activities.  ? - MODERATE (4-7): interferes with normal activities (e.g., work or school) or awakens from sleep, limping.  ? - SEVERE (8-10): excruciating pain, unable to do any normal activities, unable to walk.  ?    2 not moving, 5-6/10 ?4. WORK OR EXERCISE: "Has there been any recent work or exercise that involved this part of the body?"  ?    no ?5. CAUSE: "What do you think is causing the foot pain?" ?    no ?6. OTHER SYMPTOMS: "Do you have any other symptoms?" (e.g., leg pain, rash, fever, numbness) ?    no ?7. PREGNANCY: "Is there any chance you are pregnant?" "When was your last menstrual period?" ?    na ? ?Protocols used: Foot Pain-A-AH ? ?

## 2021-03-30 MED ORDER — AMPHETAMINE-DEXTROAMPHETAMINE 20 MG PO TABS: 20.0000 mg | ORAL_TABLET | Freq: Every day | ORAL | 0 refills | Status: DC

## 2021-04-10 ENCOUNTER — Ambulatory Visit: Payer: Managed Care, Other (non HMO) | Admitting: Physician Assistant

## 2021-04-10 NOTE — Progress Notes (Deleted)
?  ? ? ?  Established patient visit ? ? ?Patient: Krystal Marks   DOB: 02-20-1975   45 y.o. Female  MRN: [id number] ?Visit Date: 04/10/2021 ? ?Today's healthcare provider: Debera Lat, PA-C  ? ?No chief complaint on file. ? ?Subjective  ?  ?HPI  ?Patient presents for follow up left foot pain and elevated blood pressure at last visit. ?Advised to measure her blood pressure at home and bring BP log to the this appt.  ? ? ?Review of Systems ? ?{Labs  Heme  Chem  Endocrine  Serology  Results Review (optional):23779} ?  Objective  ?  ?There were no vitals taken for this visit. ?{Show previous vital signs (optional):23777} ? ?Physical Exam  ?*** ? ?No results found for any visits on 04/10/21. ? Assessment & Plan  ?  ? ?*** ? ?No follow-ups on file.  ?   ? ?{provider attestation***:1} ? ? ?Debera Lat, PA-C  ?[facility] ?[phone] (phone) ?[phone] (fax) ? ?[facility] ?

## 2021-04-14 NOTE — Progress Notes (Signed)
?  ? ? ?Established patient visit ? ? ?Patient: Krystal Marks   DOB: [birth year]   46 y.o. Female  MRN: [id number] ?Visit Date: 04/15/2021 ? ?Today's healthcare provider: Megan Mans, MD  ? ?Chief Complaint  ?Patient presents with  ? ADD  ? Obesity  ?  Patient would like to address weight gain and to discuss smoking cessation, patient states at last office visit she discussed with PA about starting Wellbutrin.   ? Fatigue  ?  Patient reports concerns of increasing fatigue over the past two weeks, patient states that she is following asleep during the early evenings 6-7PM. Patient states that when she uses her  prescription nasal spray ( Azelastine)  symptoms of fatigue completley resolve.   ? Gastroesophageal Reflux  ? ?Subjective  ?  ?Gastroesophageal Reflux ?She complains of belching, early satiety, heartburn and nausea. She reports no abdominal pain, no chest pain, no choking, no coughing, no dysphagia, no globus sensation, no hoarse voice, no sore throat, no stridor, no tooth decay, no water brash or no wheezing. This is a recurrent problem. The current episode started in the past 7 days. The problem has been unchanged. The heartburn is located in the substernum. The heartburn is of moderate intensity. The heartburn does not wake her from sleep. The heartburn does not limit her activity. The heartburn doesn't change with position. Associated symptoms include fatigue. Pertinent negatives include no anemia, melena, muscle weakness, orthopnea or weight loss. Treatments tried: lanzsoprazole and famotidine. The treatment provided no relief.  ?HPI   ? ? Obesity   ? ?      ? Comments: Patient would like to address weight gain and to discuss smoking cessation, patient states at last office visit she discussed with PA about starting Wellbutrin.   ? ?  ?  ? ? Fatigue   ? ?      ? Comments: Patient reports concerns of increasing fatigue over the past two weeks, patient states that she is following asleep during the  early evenings 6-7PM. Patient states that when she uses her  prescription nasal spray ( Azelastine)  symptoms of fatigue completley resolve.   ? ?  ?  ?Last edited by Fonda Kinder, CMA on 04/15/2021  4:05 PM.  ?  ?  ?Attention Deficit Disorder ?Patient is a 46 year old female who presents for follow up of her ADD.  She was last seen for this on 02/06/21.  No management changes were made at that time.  However, it was mentioned in that change may need to be made from Adderall 20 mg to a low dose Adderall XR to provide longer coverage.   ?Patient reports good compliance and symptom control on Adderrall.  ? ?Health Maintenance ?Patient is due for Hep C screening, mammogram, pap smear and colonoscopy. Her last PHQ screening/monitoring was done on 02/06/21. ?Patient is under a lot of stress as she is a newlywed and her husband has been struggling with depression and that is affecting their marriage.  She continues to smoke.  She has ongoing problems with worsening GERD. ?Patient states that as she has gained weight since she got married she is starting to snore.  He has some fatigue during the day ?Medications: ?Outpatient Medications Prior to Visit  ?Medication Sig  ? amphetamine-dextroamphetamine (ADDERALL) 20 MG tablet Take 1-2 tablets (20-40 mg total) by mouth daily.  ? HYDROXYZINE HCL PO Take by mouth.  ? predniSONE (DELTASONE) 20 MG tablet Take 1 tablet (20 mg total)  by mouth daily with breakfast.  ? ?No facility-administered medications prior to visit.  ? ? ?Review of Systems  ?Constitutional:  Positive for fatigue. Negative for weight loss.  ?HENT:  Negative for hoarse voice and sore throat.   ?Respiratory:  Negative for cough, choking and wheezing.   ?Cardiovascular:  Negative for chest pain.  ?Gastrointestinal:  Positive for heartburn and nausea. Negative for abdominal pain, dysphagia and melena.  ?Musculoskeletal:  Negative for muscle weakness.  ? ?Last thyroid functions ?Lab Results  ?Component Value Date   ? TSH 1.170 01/12/2017  ? ?  ?  Objective  ?  ?BP 118/77   Pulse 90   Resp 16   Wt 165 lb 12.8 oz (75.2 kg)   LMP 03/31/2021 (Exact Date)   SpO2 97%   BMI 27.59 kg/m?  ?BP Readings from Last 3 Encounters:  ?04/15/21 118/77  ?03/27/21 131/64  ?02/06/21 115/73  ? ?Wt Readings from Last 3 Encounters:  ?04/15/21 165 lb 12.8 oz (75.2 kg)  ?03/27/21 168 lb (76.2 kg)  ?02/06/21 168 lb (76.2 kg)  ? ?  ? ?Physical Exam ?Vitals reviewed.  ?Constitutional:   ?   General: She is not in acute distress. ?   Appearance: Normal appearance. She is well-developed. She is not diaphoretic.  ?HENT:  ?   Head: Normocephalic and atraumatic.  ?   Right Ear: External ear normal.  ?   Left Ear: External ear normal.  ?   Mouth/Throat:  ?   Pharynx: Oropharynx is clear.  ?Eyes:  ?   General: No scleral icterus. ?   Conjunctiva/sclera: Conjunctivae normal.  ?Neck:  ?   Thyroid: No thyromegaly.  ?Cardiovascular:  ?   Rate and Rhythm: Normal rate and regular rhythm.  ?   Pulses: Normal pulses.  ?   Heart sounds: Normal heart sounds. No murmur heard. ?Pulmonary:  ?   Effort: Pulmonary effort is normal. No respiratory distress.  ?   Breath sounds: Normal breath sounds. No wheezing, rhonchi or rales.  ?Abdominal:  ?   General: Abdomen is flat.  ?   Palpations: Abdomen is soft.  ?Musculoskeletal:  ?   Cervical back: Neck supple.  ?   Right lower leg: No edema.  ?   Left lower leg: No edema.  ?Lymphadenopathy:  ?   Cervical: No cervical adenopathy.  ?Skin: ?   General: Skin is warm and dry.  ?   Findings: No rash.  ?Neurological:  ?   Mental Status: She is alert and oriented to person, place, and time. Mental status is at baseline.  ?Psychiatric:     ?   Mood and Affect: Mood normal.     ?   Behavior: Behavior normal.     ?   Thought Content: Thought content normal.     ?   Judgment: Judgment normal.  ?  ? ? ?No results found for any visits on 04/15/21. ? Assessment & Plan  ?  ? ?1. Gastroesophageal reflux disease, unspecified whether  esophagitis present ?Add Prevacid 30 mg every morning.  Weight loss would also probably help a little bit.  She has seen GI for this in the past but I think the PPI will work well ?- lansoprazole (PREVACID) 30 MG capsule; Take 1 capsule (30 mg total) by mouth every morning.  Dispense: 90 capsule; Refill: 1 ? ?2. Cigarette smoker ?Try Wellbutrin for the smoking and for depression and anxiety. ?- buPROPion (WELLBUTRIN XL) 150 MG 24 hr tablet; Take 1  tablet (150 mg total) by mouth daily.  Dispense: 90 tablet; Refill: 1 ? ?3. Attention deficit hyperactivity disorder (ADHD), combined type ?Refill Adderall x3 ?- amphetamine-dextroamphetamine (ADDERALL) 20 MG tablet; Take 1-2 tablets (20-40 mg total) by mouth daily.  Dispense: 60 tablet; Refill: 0 ? ?4. Anxiety, generalized ?Wellbutrin as above ? ?5. Recurrent major depressive disorder, in partial remission (HCC) ?Low-dose Wellbutrin ? ? ?No follow-ups on file.  ?   ? ?I, Megan Mansichard Latanja Lehenbauer Jr, MD, have reviewed all documentation for this visit. The documentation on 04/20/21 for the exam, diagnosis, procedures, and orders are all accurate and complete. ? ? ?[employer],Kathleen J Wolford,acting as a Neurosurgeonscribe for [employer] Kazaria Gaertner Jr, MD.,have documented all relevant documentation on the behalf of Megan MansRichard Zyion Leidner Jr, MD,as directed by  Megan Mansichard Carleigh Buccieri Jr, MD while in the presence of Megan Mansichard Dreden Rivere Jr, MD.  ? ?Megan Mansichard Marcela Alatorre Jr, MD  ?[facility] ?[phone] (phone) ?[phone] (fax) ? ?[facility] ?

## 2021-04-15 ENCOUNTER — Ambulatory Visit: Payer: Managed Care, Other (non HMO) | Admitting: Family Medicine

## 2021-04-15 ENCOUNTER — Encounter: Payer: Self-pay | Admitting: Family Medicine

## 2021-04-15 VITALS — BP 118/77 | HR 90 | Resp 16 | Wt 165.8 lb

## 2021-04-15 DIAGNOSIS — F3341 Major depressive disorder, recurrent, in partial remission: Secondary | ICD-10-CM

## 2021-04-15 DIAGNOSIS — F411 Generalized anxiety disorder: Secondary | ICD-10-CM

## 2021-04-15 DIAGNOSIS — F17210 Nicotine dependence, cigarettes, uncomplicated: Secondary | ICD-10-CM

## 2021-04-15 DIAGNOSIS — K219 Gastro-esophageal reflux disease without esophagitis: Secondary | ICD-10-CM | POA: Diagnosis not present

## 2021-04-15 DIAGNOSIS — F902 Attention-deficit hyperactivity disorder, combined type: Secondary | ICD-10-CM | POA: Diagnosis not present

## 2021-04-15 MED ORDER — LANSOPRAZOLE 30 MG PO CPDR: 30.0000 mg | DELAYED_RELEASE_CAPSULE | Freq: Every morning | ORAL | 1 refills | Status: DC

## 2021-04-15 MED ORDER — BUPROPION HCL ER (XL) 150 MG PO TB24: 150.0000 mg | ORAL_TABLET | Freq: Every day | ORAL | 1 refills | Status: DC

## 2021-04-18 LAB — COMPREHENSIVE METABOLIC PANEL
ALT: 15 IU/L (ref 0–32)
AST: 17 IU/L (ref 0–40)
Albumin/Globulin Ratio: 1.6 (ref 1.2–2.2)
Albumin: 4.1 g/dL (ref 3.8–4.8)
Alkaline Phosphatase: 63 IU/L (ref 44–121)
BUN/Creatinine Ratio: 16 (ref 9–23)
BUN: 13 mg/dL (ref 6–24)
Bilirubin Total: 1.4 mg/dL — ABNORMAL HIGH (ref 0.0–1.2)
CO2: 21 mmol/L (ref 20–29)
Calcium: 8.7 mg/dL (ref 8.7–10.2)
Chloride: 107 mmol/L — ABNORMAL HIGH (ref 96–106)
Creatinine, Ser: 0.82 mg/dL (ref 0.57–1.00)
Globulin, Total: 2.5 g/dL (ref 1.5–4.5)
Glucose: 96 mg/dL (ref 70–99)
Potassium: 4.4 mmol/L (ref 3.5–5.2)
Sodium: 139 mmol/L (ref 134–144)
Total Protein: 6.6 g/dL (ref 6.0–8.5)
eGFR: 90 mL/min/{1.73_m2} (ref >59)

## 2021-04-18 LAB — CBC WITH DIFFERENTIAL/PLATELET
Basophils Absolute: 0.1 10*3/uL (ref 0.0–0.2)
Basos: 1 %
EOS (ABSOLUTE): 0.1 10*3/uL (ref 0.0–0.4)
Eos: 1 %
Hematocrit: 39.6 % (ref 34.0–46.6)
Hemoglobin: 13.6 g/dL (ref 11.1–15.9)
Immature Grans (Abs): 0 10*3/uL (ref 0.0–0.1)
Immature Granulocytes: 0 %
Lymphocytes Absolute: 1.6 10*3/uL (ref 0.7–3.1)
Lymphs: 25 %
MCH: 31.1 pg (ref 26.6–33.0)
MCHC: 34.3 g/dL (ref 31.5–35.7)
MCV: 91 fL (ref 79–97)
Monocytes Absolute: 0.6 10*3/uL (ref 0.1–0.9)
Monocytes: 9 %
Neutrophils Absolute: 4.1 10*3/uL (ref 1.4–7.0)
Neutrophils: 64 %
Platelets: 265 10*3/uL (ref 150–450)
RBC: 4.37 x10E6/uL (ref 3.77–5.28)
RDW: 11.8 % (ref 11.7–15.4)
WBC: 6.4 10*3/uL (ref 3.4–10.8)

## 2021-04-18 LAB — LIPID PANEL
Chol/HDL Ratio: 2.8 ratio (ref 0.0–4.4)
Cholesterol, Total: 162 mg/dL (ref 100–199)
HDL: 57 mg/dL (ref >39)
LDL Chol Calc (NIH): 94 mg/dL (ref 0–99)
Triglycerides: 52 mg/dL (ref 0–149)
VLDL Cholesterol Cal: 11 mg/dL (ref 5–40)

## 2021-04-20 MED ORDER — AMPHETAMINE-DEXTROAMPHETAMINE 20 MG PO TABS: 20.0000 mg | ORAL_TABLET | Freq: Two times a day (BID) | ORAL | 0 refills | Status: DC

## 2021-04-20 MED ORDER — AMPHETAMINE-DEXTROAMPHETAMINE 20 MG PO TABS: 20.0000 mg | ORAL_TABLET | Freq: Every day | ORAL | 0 refills | Status: DC

## 2021-05-11 ENCOUNTER — Telehealth: Payer: Self-pay | Admitting: Family Medicine

## 2021-05-11 NOTE — Telephone Encounter (Signed)
Pt called in for assistance. Pt says that she has been taking  HYDROXYZINE HCL PO --4-25MG  a night. Pt says that this strength isn't helping her sleep. Pt would like to know if provider would write her a Rx for a stronger strength to help her sleep.  ? ? ?Phone:  ?[id number] [place], [place] - [address] Phone:  [phone]  ?Fax:  [phone]  ?  ? ? ? ?

## 2021-05-15 NOTE — Telephone Encounter (Signed)
Pt called to get update on Hydroxyzine request / please advise  ?

## 2021-05-15 NOTE — Telephone Encounter (Signed)
Please advise? Medication is listed as Historical. ?

## 2021-05-19 NOTE — Telephone Encounter (Signed)
Pt called back for update on refill for Hydroxyzine but now she is in [facility] for the next 9 days and needs RX sent to  ?[id number] [place], [place] - [phone] AT [address] Phone:  [phone]  ?Fax:  [phone]  ?  ? ?

## 2021-05-29 ENCOUNTER — Telehealth: Payer: Self-pay

## 2021-05-29 NOTE — Telephone Encounter (Signed)
Copied from CRM [id number]. Topic: General - Other >> May 29, 2021  3:25 PM Traci Sermon wrote: Reason for CRM: Pt called in, not to happy, pt states she originally called in on 05/11/21(encounter) to do a medication refill for her HYDROXYZINE HCL PO, pt also mentioned Dr. Sullivan Lone has been letting her take her husband [name]-06/27/75) perscribed Hydroxyzine, she stated she has been waiting for weeks, and once she got home today, the wrong Hydropxyzine was mailed to them with a total of 270 tablets, pt states that doesn't work for her, it keeps her awake, pt is requesting a call back asap, so she can explain the whole situation, please advise. CB: [phone].

## 2021-06-02 NOTE — Telephone Encounter (Signed)
See other message in pt's chart. Already called and discussed issue with patient.

## 2021-06-02 NOTE — Telephone Encounter (Signed)
Returned call to patient. Patient is upset because her husband received a refill of Hydroxyzine 25 mg in capsule form. Patient states she is taking his meds! Patient states she can not take capsule form. She has to have tablet form. Advised pt that Dr. Rosanna Randy is out of town until 06/08/2021, and if she needs an rx now, she needs to schedule an ov with another provider. Patient wants to wait till Dr. Rosanna Randy has returned and discuss issues with him then.

## 2021-06-02 NOTE — Telephone Encounter (Signed)
Pt called for refill for Hydroxyzine HCl Tablets(not capsule) on 5[place] / no one has reached out to the pt with an update but an RX was sent to her husband / she was wondering if this was an error and if so it is the wrong RX and that kind keeps her awake / pt is concerned that she has called several times about this refill / Pt was advise it was ok to take the ones her husbands RX but he has ran out and no longer uses it as much / she stated she discussed this with DR. Rosanna Randy before / please advise asap

## 2021-06-08 NOTE — Telephone Encounter (Signed)
OK to do Hydroxyzine 25 mg TID prn anxiety--tablets ok--#90,5rf

## 2021-06-09 ENCOUNTER — Other Ambulatory Visit: Payer: Self-pay

## 2021-06-09 MED ORDER — HYDROXYZINE HCL 25 MG PO TABS: 25.0000 mg | ORAL_TABLET | Freq: Four times a day (QID) | ORAL | 5 refills | Status: DC | PRN

## 2021-06-09 NOTE — Telephone Encounter (Signed)
Rx sent to pharmacy and patient notified.   

## 2021-07-21 ENCOUNTER — Ambulatory Visit: Payer: Managed Care, Other (non HMO) | Admitting: Family Medicine

## 2021-07-21 ENCOUNTER — Encounter: Payer: Self-pay | Admitting: Family Medicine

## 2021-07-21 VITALS — BP 100/60 | HR 73 | Temp 98.3°F | Resp 14 | Wt 169.0 lb

## 2021-07-21 DIAGNOSIS — G5603 Carpal tunnel syndrome, bilateral upper limbs: Secondary | ICD-10-CM

## 2021-07-21 DIAGNOSIS — G5621 Lesion of ulnar nerve, right upper limb: Secondary | ICD-10-CM

## 2021-07-21 DIAGNOSIS — F902 Attention-deficit hyperactivity disorder, combined type: Secondary | ICD-10-CM

## 2021-07-21 MED ORDER — NAPROXEN 500 MG PO TABS: 500.0000 mg | ORAL_TABLET | Freq: Two times a day (BID) | ORAL | 0 refills | Status: DC | PRN

## 2021-07-21 NOTE — Progress Notes (Signed)
I,Roshena L Chambers,acting as a scribe for Megan Mans, MD.,have documented all relevant documentation on the behalf of Megan Mans, MD,as directed by  Megan Mans, MD while in the presence of Megan Mans, MD.   Established patient visit   Patient: Krystal Marks   DOB: 1975-10-19   46 y.o. Female  MRN: [id number] Visit Date: 07/21/2021  Today's healthcare provider: Megan Mans, MD   Chief Complaint  Patient presents with   Depression   Anxiety   Gastroesophageal Reflux   Subjective    HPI  Patient comes in today for follow-up.  She is feeling much better than her last visit. 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 He is having some problems with what appears to be some bilateral carpal tunnel syndrome symptoms and possible right ulnar neuropathy. He needs refill of her ADD medicine just for work days.  Depression and anxiety, Follow-up  She  was last seen for this 3 months ago. From that visit: Try Wellbutrin for the smoking and for depression and anxiety.       02/06/2021    3:58 PM 12/05/2019    3:53 PM 07/12/2019    3:21 PM  Depression screen PHQ 2/9  Decreased Interest 0 0 0  Down, Depressed, Hopeless 0 0 0  PHQ - 2 Score 0 0 0  Altered sleeping 0 0 0  Tired, decreased energy 0 0 0  Change in appetite 0 0 0  Feeling bad or failure about yourself  0 0 0  Trouble concentrating 0 0 0  Moving slowly or fidgety/restless 0 0 0  Suicidal thoughts 0 0 0  PHQ-9 Score 0 0 0  Difficult doing work/chores Not difficult at all Not difficult at all Not difficult at all    -----------------------------------------------------------------------------------------   Follow up for cigarette smoker:  The patient was last seen for this 3 months ago. From that visit: Try Wellbutrin for the smoking and for depression and anxiety. Patient stopped taking medication 3 weeks ago due to no improvement in symptoms. Patient states she  doesn't have any issues with depression or anxiety.  She reports poor compliance with treatment. She feels that condition is Unchanged.   -----------------------------------------------------------------------------------------   Follow up for GERD:  The patient was last seen for this 3 months ago. Changes made at last visit include adding Prevacid 30 mg every morning.  Weight loss would also probably help a little bit.  She reports good compliance with treatment. She feels that condition is Improved.  She is not having side effects.   -----------------------------------------------------------------------------------------   Medications: Outpatient Medications Prior to Visit  Medication Sig   amphetamine-dextroamphetamine (ADDERALL) 20 MG tablet Take 1-2 tablets (20-40 mg total) by mouth 2 (two) times daily. For 05/15/21 or later   hydrOXYzine (ATARAX) 25 MG tablet Take 1 tablet (25 mg total) by mouth every 6 (six) hours as needed.   lansoprazole (PREVACID) 30 MG capsule Take 1 capsule (30 mg total) by mouth every morning.   buPROPion (WELLBUTRIN XL) 150 MG 24 hr tablet Take 1 tablet (150 mg total) by mouth daily. (Patient not taking: Reported on 07/21/2021)   [DISCONTINUED] predniSONE (DELTASONE) 20 MG tablet Take 1 tablet (20 mg total) by mouth daily with breakfast.   No facility-administered medications prior to visit.    Review of Systems  Constitutional:  Negative for appetite change, chills, fatigue and fever.  Respiratory:  Negative for chest tightness and  shortness of breath.   Cardiovascular:  Negative for chest pain and palpitations.  Gastrointestinal:  Negative for abdominal pain, nausea and vomiting.  Neurological:  Positive for headaches (posterior headache x 3 days; resolved last night). Negative for weakness.    Last thyroid functions Lab Results  Component Value Date   TSH 1.170 01/12/2017       Objective    BP 100/60 (BP Location: Right Arm, Patient  Position: Sitting, Cuff Size: Large)   Pulse 73   Temp 98.3 F (36.8 C) (Oral)   Resp 14   Wt 169 lb (76.7 kg)   SpO2 98% Comment: room air  BMI 28.12 kg/m  BP Readings from Last 3 Encounters:  07/21/21 100/60  04/15/21 118/77  03/27/21 131/64   Wt Readings from Last 3 Encounters:  07/21/21 169 lb (76.7 kg)  04/15/21 165 lb 12.8 oz (75.2 kg)  03/27/21 168 lb (76.2 kg)      Today's Vitals   07/21/21 1529 07/21/21 1539 07/21/21 1540  BP: 92/62 108/70 100/60  Pulse: 73    Resp: 14    Temp: 98.3 F (36.8 C)    TempSrc: Oral    SpO2: 98%    Weight: 169 lb (76.7 kg)     Body mass index is 28.12 kg/m.    Physical Exam Vitals reviewed.  Constitutional:      General: She is not in acute distress.    Appearance: She is well-developed.  HENT:     Head: Normocephalic and atraumatic.     Right Ear: Hearing normal.     Left Ear: Hearing normal.     Nose: Nose normal.  Eyes:     General: Lids are normal. No scleral icterus.       Right eye: No discharge.        Left eye: No discharge.     Conjunctiva/sclera: Conjunctivae normal.  Cardiovascular:     Rate and Rhythm: Normal rate and regular rhythm.     Heart sounds: Normal heart sounds.  Pulmonary:     Effort: Pulmonary effort is normal. No respiratory distress.  Skin:    General: Skin is warm and dry.     Findings: No lesion or rash.  Neurological:     General: No focal deficit present.     Mental Status: She is alert and oriented to person, place, and time.     Comments: Grip is normal neurosensory exam negative of upper extremities Tinel's sign normal  Psychiatric:        Mood and Affect: Mood normal.        Speech: Speech normal.        Behavior: Behavior normal.        Thought Content: Thought content normal.        Judgment: Judgment normal.       No results found for any visits on 07/21/21.  Assessment & Plan     1. Attention deficit hyperactivity disorder (ADHD), combined type Controlled. Continue  same dose of Adderall. Follow up in 3-4 months. 3 prescriptions of Adderall. 2. Bilateral carpal tunnel syndrome Recommend using a cock up wrist splint to sleep in.  May need neurology referral in the future. - naproxen (NAPROSYN) 500 MG tablet; Take 1 tablet (500 mg total) by mouth 2 (two) times daily as needed.  Dispense: 40 tablet; Refill: 0  3. Ulnar neuropathy   - naproxen (NAPROSYN) 500 MG tablet; Take 1 tablet (500 mg total) by mouth 2 (two) times daily  as needed.  Dispense: 40 tablet; Refill: 0   Return in about 3 months (around 10/21/2021).      I, Megan Mans, MD, have reviewed all documentation for this visit. The documentation on 07/23/21 for the exam, diagnosis, procedures, and orders are all accurate and complete.    Shilo Philipson Wendelyn Breslow, MD  [facility] [phone] (phone) [phone] (fax)  [facility]

## 2021-07-21 NOTE — Patient Instructions (Signed)
Recommend using a cock up wrist splint to sleep in.

## 2021-07-23 MED ORDER — AMPHETAMINE-DEXTROAMPHETAMINE 20 MG PO TABS: 20.0000 mg | ORAL_TABLET | Freq: Two times a day (BID) | ORAL | 0 refills | Status: DC

## 2021-07-28 ENCOUNTER — Ambulatory Visit: Payer: Self-pay

## 2021-07-28 NOTE — Telephone Encounter (Signed)
  Chief Complaint: Breast pain left breast when pushed on. Symptoms: Pain when pushed on Frequency: 1 week ago Pertinent Negatives: Patient denies Fever, redness Disposition: [] ED /[] Urgent Care (no appt availability in office) / [x] Appointment(In office/virtual)/ []  [facility]/ [] Home Care/ [] Refused Recommended Disposition /[] [facility]/ []  Follow-up with PCP Additional Notes: Pt states that about 1 week ago she noticed a tender spot on her left breast. No redness, fever, nipple discharge.  Pt states that she had a lump in 1 breast years ago, but this resolved. Reason for Disposition  [1] Breast pain or tenderness AND [2] occurs monthly before menstrual period AND [3] has NOT been evaluated by a doctor (or NP/PA)  Answer Assessment - Initial Assessment Questions 1. SYMPTOM: "What's the main symptom you're concerned about?"  (e.g., lump, pain, rash, nipple discharge)     Left breast pain, almost at rib cage. Tender when pressed on it. 2. LOCATION: "Where is the pain located?"     Left breast 3. ONSET: "When did pain  start?"     1 week ago 4. PRIOR HISTORY: "Do you have any history of prior problems with your breasts?" (e.g., lumps, cancer, fibrocystic breast disease)     Yes - bur cannot remember. 5. CAUSE: "What do you think is causing this symptom?"     Dense breasts 6. OTHER SYMPTOMS: "Do you have any other symptoms?" (e.g., fever, breast pain, redness or rash, nipple discharge)     no 7. PREGNANCY-BREASTFEEDING: "Is there any chance you are pregnant?" "When was your last menstrual period?" "Are you breastfeeding?"     no  Protocols used: Breast Symptoms-A-AH

## 2021-08-06 ENCOUNTER — Other Ambulatory Visit: Payer: Self-pay | Admitting: Family Medicine

## 2021-08-06 ENCOUNTER — Ambulatory Visit: Payer: Managed Care, Other (non HMO) | Admitting: Family Medicine

## 2021-08-06 ENCOUNTER — Encounter: Payer: Self-pay | Admitting: Family Medicine

## 2021-08-06 VITALS — BP 118/65 | HR 76 | Resp 16 | Ht 65.0 in | Wt 168.0 lb

## 2021-08-06 DIAGNOSIS — I889 Nonspecific lymphadenitis, unspecified: Secondary | ICD-10-CM | POA: Diagnosis not present

## 2021-08-06 DIAGNOSIS — N61 Mastitis without abscess: Secondary | ICD-10-CM

## 2021-08-06 DIAGNOSIS — R5383 Other fatigue: Secondary | ICD-10-CM

## 2021-08-06 DIAGNOSIS — N644 Mastodynia: Secondary | ICD-10-CM

## 2021-08-06 DIAGNOSIS — F419 Anxiety disorder, unspecified: Secondary | ICD-10-CM

## 2021-08-06 DIAGNOSIS — F321 Major depressive disorder, single episode, moderate: Secondary | ICD-10-CM

## 2021-08-06 DIAGNOSIS — F902 Attention-deficit hyperactivity disorder, combined type: Secondary | ICD-10-CM

## 2021-08-06 DIAGNOSIS — N926 Irregular menstruation, unspecified: Secondary | ICD-10-CM

## 2021-08-06 MED ORDER — CEPHALEXIN 500 MG PO CAPS: 500.0000 mg | ORAL_CAPSULE | Freq: Two times a day (BID) | ORAL | 0 refills | Status: DC

## 2021-08-06 NOTE — Progress Notes (Signed)
Established patient visit  I,[name],acting as a scribe for Wilhemena Durie, MD.,have documented all relevant documentation on the behalf of Wilhemena Durie, MD,as directed by  Wilhemena Durie, MD while in the presence of Wilhemena Durie, MD.   Patient: Krystal Marks   DOB: 1975-09-26   46 y.o. Female  MRN: [id number] Visit Date: 08/06/2021  Today's healthcare provider: Wilhemena Durie, MD   Chief Complaint  Patient presents with   Breast Pain   Subjective    HPI  Patient is here for left breast pain. Patient is underarm and at 1 o'clock. Patient states she is also feeling pain near the canter of left breast. Patient had same symptoms with left breast on 2018. MM was obtained. She states she feels a fullness in the left axilla. He also states that she has irregular menses recently and wonders if she could be going through menopause with extra fatigue.   Patient also has several other issues she wants to discuss.    Medications: Outpatient Medications Prior to Visit  Medication Sig   amphetamine-dextroamphetamine (ADDERALL) 20 MG tablet Take 1-2 tablets (20-40 mg total) by mouth 2 (two) times daily. For 05/15/21 or later   hydrOXYzine (ATARAX) 25 MG tablet Take 1 tablet (25 mg total) by mouth every 6 (six) hours as needed.   lansoprazole (PREVACID) 30 MG capsule Take 1 capsule (30 mg total) by mouth every morning.   naproxen (NAPROSYN) 500 MG tablet Take 1 tablet (500 mg total) by mouth 2 (two) times daily as needed.   buPROPion (WELLBUTRIN XL) 150 MG 24 hr tablet Take 1 tablet (150 mg total) by mouth daily. (Patient not taking: Reported on 07/21/2021)   No facility-administered medications prior to visit.    Review of Systems  Constitutional:  Negative for appetite change, chills, fatigue and fever.  Respiratory:  Negative for chest tightness and shortness of breath.   Cardiovascular:  Negative for chest pain and palpitations.  Gastrointestinal:   Negative for abdominal pain, nausea and vomiting.  Neurological:  Negative for dizziness and weakness.        Objective    BP 118/65 (BP Location: Left Arm, Patient Position: Sitting, Cuff Size: Normal)   Pulse 76   Resp 16   Ht '5\' 5"'  (1.651 m)   Wt 168 lb (76.2 kg)   SpO2 100%   BMI 27.96 kg/m  BP Readings from Last 3 Encounters:  08/06/21 118/65  07/21/21 100/60  04/15/21 118/77   Wt Readings from Last 3 Encounters:  08/06/21 168 lb (76.2 kg)  07/21/21 169 lb (76.7 kg)  04/15/21 165 lb 12.8 oz (75.2 kg)      Physical Exam Vitals reviewed.  Constitutional:      General: She is not in acute distress.    Appearance: She is well-developed.  HENT:     Head: Normocephalic and atraumatic.     Right Ear: Hearing normal.     Left Ear: Hearing normal.     Nose: Nose normal.  Eyes:     General: Lids are normal. No scleral icterus.       Right eye: No discharge.        Left eye: No discharge.     Conjunctiva/sclera: Conjunctivae normal.  Cardiovascular:     Rate and Rhythm: Normal rate and regular rhythm.     Heart sounds: Normal heart sounds.  Pulmonary:     Effort: Pulmonary effort is normal. No respiratory distress.  Chest:     Comments: Bilateral implants in place.  He has tenderness 2:00 in the left breast but no discernible mass effect.  No lymphadenopathy in the left axilla that is discernible Skin:    Findings: No lesion or rash.  Neurological:     General: No focal deficit present.     Mental Status: She is alert and oriented to person, place, and time.  Psychiatric:        Mood and Affect: Mood normal.        Speech: Speech normal.        Behavior: Behavior normal.        Thought Content: Thought content normal.        Judgment: Judgment normal.       No results found for any visits on 08/06/21.  Assessment & Plan     1. Fatigue, unspecified type  - Hemoglobin A1c - CBC w/Diff/Platelet - TSH - Sed Rate (ESR) - FSH/LH  2. Mastitis Obtain  diagnostic mammogram on left - Hemoglobin A1c - CBC w/Diff/Platelet - TSH - Sed Rate (ESR) - FSH/LH  3. Irregular menses  - Hemoglobin A1c - CBC w/Diff/Platelet - TSH - Sed Rate (ESR) - FSH/LH  4. Lymphadenitis Treat with Keflex for a week - Hemoglobin A1c - CBC w/Diff/Platelet - TSH - Sed Rate (ESR) - FSH/LH - cephALEXin (KEFLEX) 500 MG capsule; Take 1 capsule (500 mg total) by mouth 2 (two) times daily.  Dispense: 14 capsule; Refill: 0  5. Breast pain, left  - MM DIAG BREAST TOMO BILATERAL   No follow-ups on file.      I, Wilhemena Durie, MD, have reviewed all documentation for this visit. The documentation on 08/11/21 for the exam, diagnosis, procedures, and orders are all accurate and complete.    Jennika Ringgold Cranford Mon, MD  [facility] [phone] (phone) [phone] (fax)  [place]

## 2021-08-07 LAB — CBC WITH DIFFERENTIAL/PLATELET
Basophils Absolute: 0 10*3/uL (ref 0.0–0.2)
Basos: 1 %
EOS (ABSOLUTE): 0 10*3/uL (ref 0.0–0.4)
Eos: 1 %
Hematocrit: 41.4 % (ref 34.0–46.6)
Hemoglobin: 14.3 g/dL (ref 11.1–15.9)
Immature Grans (Abs): 0 10*3/uL (ref 0.0–0.1)
Immature Granulocytes: 0 %
Lymphocytes Absolute: 1.5 10*3/uL (ref 0.7–3.1)
Lymphs: 34 %
MCH: 30.8 pg (ref 26.6–33.0)
MCHC: 34.5 g/dL (ref 31.5–35.7)
MCV: 89 fL (ref 79–97)
Monocytes Absolute: 0.4 10*3/uL (ref 0.1–0.9)
Monocytes: 10 %
Neutrophils Absolute: 2.4 10*3/uL (ref 1.4–7.0)
Neutrophils: 54 %
Platelets: 261 10*3/uL (ref 150–450)
RBC: 4.64 x10E6/uL (ref 3.77–5.28)
RDW: 11.5 % — ABNORMAL LOW (ref 11.7–15.4)
WBC: 4.4 10*3/uL (ref 3.4–10.8)

## 2021-08-07 LAB — TSH: TSH: 1.75 u[IU]/mL (ref 0.450–4.500)

## 2021-08-07 LAB — SEDIMENTATION RATE: Sed Rate: 5 mm/hr (ref 0–32)

## 2021-08-07 LAB — FSH/LH
FSH: 6.8 m[IU]/mL
LH: 8.5 m[IU]/mL

## 2021-08-07 LAB — HEMOGLOBIN A1C
Est. average glucose Bld gHb Est-mCnc: 108 mg/dL
Hgb A1c MFr Bld: 5.4 % (ref 4.8–5.6)

## 2021-08-13 ENCOUNTER — Inpatient Hospital Stay
Admission: RE | Admit: 2021-08-13 | Discharge: 2021-08-13 | Disposition: A | Discharge status: Home / Self Care | Payer: Self-pay | Source: Ambulatory Visit | Attending: *Deleted | Admitting: *Deleted

## 2021-08-13 ENCOUNTER — Other Ambulatory Visit: Payer: Self-pay | Admitting: *Deleted

## 2021-08-13 DIAGNOSIS — Z1231 Encounter for screening mammogram for malignant neoplasm of breast: Secondary | ICD-10-CM

## 2021-08-31 ENCOUNTER — Ambulatory Visit
Admission: RE | Admit: 2021-08-31 | Discharge: 2021-08-31 | Disposition: A | Discharge status: Home / Self Care | Payer: Managed Care, Other (non HMO) | Source: Ambulatory Visit | Attending: Family Medicine | Admitting: Family Medicine

## 2021-08-31 DIAGNOSIS — N644 Mastodynia: Secondary | ICD-10-CM | POA: Diagnosis present

## 2021-09-10 ENCOUNTER — Encounter: Payer: Self-pay | Admitting: Family Medicine

## 2021-09-11 ENCOUNTER — Other Ambulatory Visit: Payer: Self-pay | Admitting: *Deleted

## 2021-09-11 DIAGNOSIS — F902 Attention-deficit hyperactivity disorder, combined type: Secondary | ICD-10-CM

## 2021-09-13 MED ORDER — AMPHETAMINE-DEXTROAMPHETAMINE 20 MG PO TABS: 20.0000 mg | ORAL_TABLET | Freq: Two times a day (BID) | ORAL | 0 refills | Status: DC

## 2021-09-15 ENCOUNTER — Other Ambulatory Visit: Payer: Self-pay | Admitting: Family Medicine

## 2021-09-15 DIAGNOSIS — K219 Gastro-esophageal reflux disease without esophagitis: Secondary | ICD-10-CM

## 2021-10-20 NOTE — Progress Notes (Unsigned)
   I,Sulibeya S Dimas,acting as a Education administrator for [employer], MD.,have documented all relevant documentation on the behalf of [name], MD,as directed by  [name], MD while in the presence of [name], MD.     Established patient visit   Patient: Krystal Marks   DOB: 03-09-1975   46 y.o. Female  MRN: [id number] Visit Date: 10/21/2021  Today's healthcare provider: [name], MD   No chief complaint on file.  Subjective    HPI  Follow up for ADHD  The patient was last seen for this 3 months ago. Changes made at last visit include no changes.  She reports {excellent/good/fair/poor:19665} compliance with treatment. She feels that condition is {improved/worse/unchanged:3041574}. She {is/is not:21021397} having side effects.  -----------------------------------------------------------------------------------------   Medications: Outpatient Medications Prior to Visit  Medication Sig   amphetamine-dextroamphetamine (ADDERALL) 20 MG tablet Take 1-2 tablets (20-40 mg total) by mouth 2 (two) times daily. For 05/15/21 or later   buPROPion (WELLBUTRIN XL) 150 MG 24 hr tablet Take 1 tablet (150 mg total) by mouth daily. (Patient not taking: Reported on 07/21/2021)   cephALEXin (KEFLEX) 500 MG capsule Take 1 capsule (500 mg total) by mouth 2 (two) times daily.   hydrOXYzine (ATARAX) 25 MG tablet Take 1 tablet (25 mg total) by mouth every 6 (six) hours as needed.   lansoprazole (PREVACID) 30 MG capsule TAKE 1 CAPSULE BY MOUTH EVERY DAY IN THE MORNING   naproxen (NAPROSYN) 500 MG tablet Take 1 tablet (500 mg total) by mouth 2 (two) times daily as needed.   No facility-administered medications prior to visit.    Review of Systems  {Labs  Heme  Chem  Endocrine  Serology  Results Review (optional):23779}   Objective    There were no vitals taken for this visit. BP Readings from Last 3 Encounters:  08/06/21  118/65  07/21/21 100/60  04/15/21 118/77   Wt Readings from Last 3 Encounters:  08/06/21 168 lb (76.2 kg)  07/21/21 169 lb (76.7 kg)  04/15/21 165 lb 12.8 oz (75.2 kg)      Physical Exam  ***  No results found for any visits on 10/21/21.  Assessment & Plan     ***  No follow-ups on file.      {provider attestation***:1}   [name], MD  [facility] [phone] (phone) [phone] (fax)  [place]

## 2021-10-21 ENCOUNTER — Encounter: Payer: Self-pay | Admitting: Family Medicine

## 2021-10-21 ENCOUNTER — Ambulatory Visit: Payer: Managed Care, Other (non HMO) | Admitting: Family Medicine

## 2021-10-21 VITALS — BP 116/81 | HR 61 | Temp 98.1°F | Resp 16 | Wt 169.6 lb

## 2021-10-21 DIAGNOSIS — Z6828 Body mass index (BMI) 28.0-28.9, adult: Secondary | ICD-10-CM | POA: Insufficient documentation

## 2021-10-21 DIAGNOSIS — F902 Attention-deficit hyperactivity disorder, combined type: Secondary | ICD-10-CM

## 2021-10-21 DIAGNOSIS — Z79899 Other long term (current) drug therapy: Secondary | ICD-10-CM

## 2021-10-21 DIAGNOSIS — E663 Overweight: Secondary | ICD-10-CM | POA: Diagnosis not present

## 2021-10-21 MED ORDER — AMPHETAMINE-DEXTROAMPHETAMINE 20 MG PO TABS: 20.0000 mg | ORAL_TABLET | Freq: Two times a day (BID) | ORAL | 0 refills | Status: DC

## 2021-10-21 NOTE — Patient Instructions (Addendum)
Please use  MyFitnessPal to help with tracking foods.   Calorie Counting for Weight Loss Calories are units of energy. Your body needs a certain number of calories from food to keep going throughout the day. When you eat or drink more calories than your body needs, your body stores the extra calories mostly as fat. When you eat or drink fewer calories than your body needs, your body burns fat to get the energy it needs. Calorie counting means keeping track of how many calories you eat and drink each day. Calorie counting can be helpful if you need to lose weight. If you eat fewer calories than your body needs, you should lose weight. Ask your health care provider what a healthy weight is for you. For calorie counting to work, you will need to eat the right number of calories each day to lose a healthy amount of weight per week. A dietitian can help you figure out how many calories you need in a day and will suggest ways to reach your calorie goal. A healthy amount of weight to lose each week is usually 1-2 lb (0.5-0.9 kg). This usually means that your daily calorie intake should be reduced by 500-750 calories. Eating 1,200-1,500 calories a day can help most women lose weight. Eating 1,500-1,800 calories a day can help most men lose weight. What do I need to know about calorie counting? Work with your health care provider or dietitian to determine how many calories you should get each day. To meet your daily calorie goal, you will need to: Find out how many calories are in each food that you would like to eat. Try to do this before you eat. Decide how much of the food you plan to eat. Keep a food log. Do this by writing down what you ate and how many calories it had. To successfully lose weight, it is important to balance calorie counting with a healthy lifestyle that includes regular activity. Where do I find calorie information?  The number of calories in a food can be found on a Nutrition Facts  label. If a food does not have a Nutrition Facts label, try to look up the calories online or ask your dietitian for help. Remember that calories are listed per serving. If you choose to have more than one serving of a food, you will have to multiply the calories per serving by the number of servings you plan to eat. For example, the label on a package of bread might say that a serving size is 1 slice and that there are 90 calories in a serving. If you eat 1 slice, you will have eaten 90 calories. If you eat 2 slices, you will have eaten 180 calories. How do I keep a food log? After each time that you eat, record the following in your food log as soon as possible: What you ate. Be sure to include toppings, sauces, and other extras on the food. How much you ate. This can be measured in cups, ounces, or number of items. How many calories were in each food and drink. The total number of calories in the food you ate. Keep your food log near you, such as in a pocket-sized notebook or on an app or website on your mobile phone. Some programs will calculate calories for you and show you how many calories you have left to meet your daily goal. What are some portion-control tips? Know how many calories are in a serving. This will help  you know how many servings you can have of a certain food. Use a measuring cup to measure serving sizes. You could also try weighing out portions on a kitchen scale. With time, you will be able to estimate serving sizes for some foods. Take time to put servings of different foods on your favorite plates or in your favorite bowls and cups so you know what a serving looks like. Try not to eat straight from a food's packaging, such as from a bag or box. Eating straight from the package makes it hard to see how much you are eating and can lead to overeating. Put the amount you would like to eat in a cup or on a plate to make sure you are eating the right portion. Use smaller plates,  glasses, and bowls for smaller portions and to prevent overeating. Try not to multitask. For example, avoid watching TV or using your computer while eating. If it is time to eat, sit down at a table and enjoy your food. This will help you recognize when you are full. It will also help you be more mindful of what and how much you are eating. What are tips for following this plan? Reading food labels Check the calorie count compared with the serving size. The serving size may be smaller than what you are used to eating. Check the source of the calories. Try to choose foods that are high in protein, fiber, and vitamins, and low in saturated fat, trans fat, and sodium. Shopping Read nutrition labels while you shop. This will help you make healthy decisions about which foods to buy. Pay attention to nutrition labels for low-fat or fat-free foods. These foods sometimes have the same number of calories or more calories than the full-fat versions. They also often have added sugar, starch, or salt to make up for flavor that was removed with the fat. Make a grocery list of lower-calorie foods and stick to it. Cooking Try to cook your favorite foods in a healthier way. For example, try baking instead of frying. Use low-fat dairy products. Meal planning Use more fruits and vegetables. One-half of your plate should be fruits and vegetables. Include lean proteins, such as chicken, [place], and fish. Lifestyle Each week, aim to do one of the following: 150 minutes of moderate exercise, such as walking. 75 minutes of vigorous exercise, such as running. General information Know how many calories are in the foods you eat most often. This will help you calculate calorie counts faster. Find a way of tracking calories that works for you. Get creative. Try different apps or programs if writing down calories does not work for you. What foods should I eat?  Eat nutritious foods. It is better to have a nutritious,  high-calorie food, such as an avocado, than a food with few nutrients, such as a bag of potato chips. Use your calories on foods and drinks that will fill you up and will not leave you hungry soon after eating. Examples of foods that fill you up are nuts and nut butters, vegetables, lean proteins, and high-fiber foods such as whole grains. High-fiber foods are foods with more than 5 g of fiber per serving. Pay attention to calories in drinks. Low-calorie drinks include water and unsweetened drinks. The items listed above may not be a complete list of foods and beverages you can eat. Contact a dietitian for more information. What foods should I limit? Limit foods or drinks that are not good sources of vitamins,  minerals, or protein or that are high in unhealthy fats. These include: Candy. Other sweets. Sodas, specialty coffee drinks, alcohol, and juice. The items listed above may not be a complete list of foods and beverages you should avoid. Contact a dietitian for more information. How do I count calories when eating out? Pay attention to portions. Often, portions are much larger when eating out. Try these tips to keep portions smaller: Consider sharing a meal instead of getting your own. If you get your own meal, eat only half of it. Before you start eating, ask for a container and put half of your meal into it. When available, consider ordering smaller portions from the menu instead of full portions. Pay attention to your food and drink choices. Knowing the way food is cooked and what is included with the meal can help you eat fewer calories. If calories are listed on the menu, choose the lower-calorie options. Choose dishes that include vegetables, fruits, whole grains, low-fat dairy products, and lean proteins. Choose items that are boiled, broiled, grilled, or steamed. Avoid items that are buttered, battered, fried, or served with cream sauce. Items labeled as crispy are usually fried,  unless stated otherwise. Choose water, low-fat milk, unsweetened iced tea, or other drinks without added sugar. If you want an alcoholic beverage, choose a lower-calorie option, such as a glass of wine or light beer. Ask for dressings, sauces, and syrups on the side. These are usually high in calories, so you should limit the amount you eat. If you want a salad, choose a garden salad and ask for grilled meats. Avoid extra toppings such as bacon, cheese, or fried items. Ask for the dressing on the side, or ask for olive oil and vinegar or lemon to use as dressing. Estimate how many servings of a food you are given. Knowing serving sizes will help you be aware of how much food you are eating at restaurants. Where to find more information Centers for Disease Control and Prevention: FootballExhibition.com.br [place]. Department of Agriculture: WrestlingReporter.dk Summary Calorie counting means keeping track of how many calories you eat and drink each day. If you eat fewer calories than your body needs, you should lose weight. A healthy amount of weight to lose per week is usually 1-2 lb (0.5-0.9 kg). This usually means reducing your daily calorie intake by 500-750 calories. The number of calories in a food can be found on a Nutrition Facts label. If a food does not have a Nutrition Facts label, try to look up the calories online or ask your dietitian for help. Use smaller plates, glasses, and bowls for smaller portions and to prevent overeating. Use your calories on foods and drinks that will fill you up and not leave you hungry shortly after a meal. This information is not intended to replace advice given to you by your health care provider. Make sure you discuss any questions you have with your health care provider.    DASH Eating Plan DASH stands for Dietary Approaches to Stop Hypertension. The DASH eating plan is a healthy eating plan that has been shown to: Reduce high blood pressure (hypertension). Reduce your risk for  type 2 diabetes, heart disease, and stroke. Help with weight loss. What are tips for following this plan? Reading food labels Check food labels for the amount of salt (sodium) per serving. Choose foods with less than 5 percent of the Daily Value of sodium. Generally, foods with less than 300 milligrams (mg) of sodium per serving fit  into this eating plan. To find whole grains, look for the word "whole" as the first word in the ingredient list. Shopping Buy products labeled as "low-sodium" or "no salt added." Buy fresh foods. Avoid canned foods and pre-made or frozen meals. Cooking Avoid adding salt when cooking. Use salt-free seasonings or herbs instead of table salt or sea salt. Check with your health care provider or pharmacist before using salt substitutes. Do not fry foods. Cook foods using healthy methods such as baking, boiling, grilling, roasting, and broiling instead. Cook with heart-healthy oils, such as olive, canola, avocado, soybean, or sunflower oil. Meal planning  Eat a balanced diet that includes: 4 or more servings of fruits and 4 or more servings of vegetables each day. Try to fill one-half of your plate with fruits and vegetables. 6-8 servings of whole grains each day. Less than 6 oz (170 g) of lean meat, poultry, or fish each day. A 3-oz (85-g) serving of meat is about the same size as a deck of cards. One egg equals 1 oz (28 g). 2-3 servings of low-fat dairy each day. One serving is 1 cup (237 mL). 1 serving of nuts, seeds, or beans 5 times each week. 2-3 servings of heart-healthy fats. Healthy fats called omega-3 fatty acids are found in foods such as walnuts, flaxseeds, fortified milks, and eggs. These fats are also found in cold-water fish, such as sardines, salmon, and mackerel. Limit how much you eat of: Canned or prepackaged foods. Food that is high in trans fat, such as some fried foods. Food that is high in saturated fat, such as fatty meat. Desserts and other  sweets, sugary drinks, and other foods with added sugar. Full-fat dairy products. Do not salt foods before eating. Do not eat more than 4 egg yolks a week. Try to eat at least 2 vegetarian meals a week. Eat more home-cooked food and less restaurant, buffet, and fast food. Lifestyle When eating at a restaurant, ask that your food be prepared with less salt or no salt, if possible. If you drink alcohol: Limit how much you use to: 0-1 drink a day for women who are not pregnant. 0-2 drinks a day for men. Be aware of how much alcohol is in your drink. In the [place] one drink equals one 12 oz bottle of beer (355 mL), one 5 oz glass of wine (148 mL), or one 1 oz glass of hard liquor (44 mL). General information Avoid eating more than 2,300 mg of salt a day. If you have hypertension, you may need to reduce your sodium intake to 1,500 mg a day. Work with your health care provider to maintain a healthy body weight or to lose weight. Ask what an ideal weight is for you. Get at least 30 minutes of exercise that causes your heart to beat faster (aerobic exercise) most days of the week. Activities may include walking, swimming, or biking. Work with your health care provider or dietitian to adjust your eating plan to your individual calorie needs. What foods should I eat? Fruits All fresh, dried, or frozen fruit. Canned fruit in natural juice (without added sugar). Vegetables Fresh or frozen vegetables (raw, steamed, roasted, or grilled). Low-sodium or reduced-sodium tomato and vegetable juice. Low-sodium or reduced-sodium tomato sauce and tomato paste. Low-sodium or reduced-sodium canned vegetables. Grains Whole-grain or whole-wheat bread. Whole-grain or whole-wheat pasta. Brown rice. [place]. Bulgur. Whole-grain and low-sodium cereals. Pita bread. Low-fat, low-sodium crackers. Whole-wheat flour tortillas. Meats and other proteins Skinless chicken or  [place]. Ground chicken or [place]. Pork with  fat trimmed off. Fish and seafood. Egg whites. Dried beans, peas, or lentils. Unsalted nuts, nut butters, and seeds. Unsalted canned beans. Lean cuts of beef with fat trimmed off. Low-sodium, lean precooked or cured meat, such as sausages or meat loaves. Dairy Low-fat (1%) or fat-free (skim) milk. Reduced-fat, low-fat, or fat-free cheeses. Nonfat, low-sodium ricotta or cottage cheese. Low-fat or nonfat yogurt. Low-fat, low-sodium cheese. Fats and oils Soft margarine without trans fats. Vegetable oil. Reduced-fat, low-fat, or light mayonnaise and salad dressings (reduced-sodium). Canola, safflower, olive, avocado, soybean, and sunflower oils. Avocado. Seasonings and condiments Herbs. Spices. Seasoning mixes without salt. Other foods Unsalted popcorn and pretzels. Fat-free sweets. The items listed above may not be a complete list of foods and beverages you can eat. Contact a dietitian for more information. What foods should I avoid? Fruits Canned fruit in a light or heavy syrup. Fried fruit. Fruit in cream or butter sauce. Vegetables Creamed or fried vegetables. Vegetables in a cheese sauce. Regular canned vegetables (not low-sodium or reduced-sodium). Regular canned tomato sauce and paste (not low-sodium or reduced-sodium). Regular tomato and vegetable juice (not low-sodium or reduced-sodium). Rosita FirePickles. Olives. Grains Baked goods made with fat, such as croissants, muffins, or some breads. Dry pasta or rice meal packs. Meats and other proteins Fatty cuts of meat. Ribs. Fried meat. Tomasa BlaseBacon. Bologna, salami, and other precooked or cured meats, such as sausages or meat loaves. Fat from the back of a pig (fatback). Bratwurst. Salted nuts and seeds. Canned beans with added salt. Canned or smoked fish. Whole eggs or egg yolks. Chicken or [place] with skin. Dairy Whole or 2% milk, cream, and half-and-half. Whole or full-fat cream cheese. Whole-fat or sweetened yogurt. Full-fat cheese. Nondairy creamers.  Whipped toppings. Processed cheese and cheese spreads. Fats and oils Butter. Stick margarine. Lard. Shortening. Ghee. Bacon fat. Tropical oils, such as coconut, palm kernel, or palm oil. Seasonings and condiments Onion salt, garlic salt, seasoned salt, table salt, and sea salt. Worcestershire sauce. Tartar sauce. Barbecue sauce. Teriyaki sauce. Soy sauce, including reduced-sodium. Steak sauce. Canned and packaged gravies. Fish sauce. Oyster sauce. Cocktail sauce. Store-bought horseradish. Ketchup. Mustard. Meat flavorings and tenderizers. Bouillon cubes. Hot sauces. Pre-made or packaged marinades. Pre-made or packaged taco seasonings. Relishes. Regular salad dressings. Other foods Salted popcorn and pretzels. The items listed above may not be a complete list of foods and beverages you should avoid. Contact a dietitian for more information. Where to find more information National Heart, Lung, and Blood Institute: PopSteam.iswww.nhlbi.nih.gov American Heart Association: www.heart.org Academy of Nutrition and Dietetics: www.eatright.org National Kidney Foundation: www.kidney.org Summary The DASH eating plan is a healthy eating plan that has been shown to reduce high blood pressure (hypertension). It may also reduce your risk for type 2 diabetes, heart disease, and stroke. When on the DASH eating plan, aim to eat more fresh fruits and vegetables, whole grains, lean proteins, low-fat dairy, and heart-healthy fats. With the DASH eating plan, you should limit salt (sodium) intake to 2,300 mg a day. If you have hypertension, you may need to reduce your sodium intake to 1,500 mg a day. Work with your health care provider or dietitian to adjust your eating plan to your individual calorie needs. This information is not intended to replace advice given to you by your health care provider. Make sure you discuss any questions you have with your health care provider. Document Revised: 11/24/2018 Document Reviewed:  11/24/2018 Elsevier Patient Education  2023 [employer]  Document Revised: 02/01/2019 Document Reviewed: 02/01/2019 Elsevier Patient Education  2023 [employer].

## 2021-10-21 NOTE — Assessment & Plan Note (Signed)
Patient on chronic Adderall for ADHD management We reviewed patient responsibilities as well as provider responsibilities for maintaining chronic stimulant medication prescription Agreement was signed and will be scanned into patient's chart

## 2021-10-21 NOTE — Assessment & Plan Note (Signed)
Chronic, stable Refills provided for Adderall 20 mg, twice daily Chronic controlled substance prescription, agreement signed as stated above Patient will follow-up in 3 months for this problem

## 2021-10-21 NOTE — Assessment & Plan Note (Signed)
Chronic problem Patient BMI elevated at 28.2 We discussed recommendations for tracking food and creating calorie deficit Patient was given information on DASH diet as well as weight management We discussed recommendations for 20-30-minute walks every day Encourage patient to incorporate dietary modification including decrease alcohol intake and calorie tracking to help with weight management She will follow-up in 1 month for this problem

## 2021-11-23 ENCOUNTER — Ambulatory Visit: Payer: Managed Care, Other (non HMO) | Admitting: Family Medicine

## 2021-11-23 ENCOUNTER — Encounter: Payer: Self-pay | Admitting: Family Medicine

## 2021-11-23 VITALS — BP 119/70 | HR 64 | Resp 16 | Ht 65.0 in | Wt 169.0 lb

## 2021-11-23 DIAGNOSIS — E663 Overweight: Secondary | ICD-10-CM

## 2021-11-23 DIAGNOSIS — G4709 Other insomnia: Secondary | ICD-10-CM | POA: Diagnosis not present

## 2021-11-23 DIAGNOSIS — Z6828 Body mass index (BMI) 28.0-28.9, adult: Secondary | ICD-10-CM

## 2021-11-23 NOTE — Assessment & Plan Note (Signed)
Patient is weight is steady at 169 pounds, BMI 28.12 Overall patient is reporting feeling subjectively well and has been successful in making several dietary and physical activity changes that will prove to be beneficial with time Encouraged her to continue with the changes that she is made Believe that with returning to the gym and exercising regularly along with resistance training and increasing protein, she will notice even more positive change in regards to weight management Congratulated her on the changes that she has been able to make in a short amount of time We will plan to follow-up in January

## 2021-11-23 NOTE — Progress Notes (Signed)
I,[name],acting as a scribe for [employer], MD.,have documented all relevant documentation on the behalf of Krystal Foster, MD,as directed by  Krystal Foster, MD while in the presence of Krystal Foster, MD.   Established patient visit   Patient: Krystal Marks   DOB: [birth year]   46 y.o. Female  MRN: [id number] Visit Date: 11/23/2021  Today's healthcare provider: [name], MD   Chief Complaint  Patient presents with   Weight Loss   Subjective    HPI   Follow up for weight management  The patient was last seen for this 4-6 weeks ago. Changes made at last visit include discussed recommendations for tracking food and creating calorie deficit and exercise.  She reports excellent compliance with treatment. She feels that condition is Unchanged. She is not having side effects.  Patient states that she has worked on making small adjustments to her dietary choices such as including egg whites with normal eggs, decreasing her sodium intake, removing the recipes that contain toxic portions of vegetables such as a Skains and seeds of tomatoes She states that she has increased the amount of recipes that she makes in the home She reports doing heavy lifting with heavy yard work She intends to join the gym in the next few weeks with goal of starting a regular exercise regimen before the new year She reports drinking water throughout the day She states she has not noticed any change on the scale however has noticed that her pants fit loosely She reports overall feeling well She reports that she was able to do some activity in her yard where she completed 10 laps In regards to her sleep, she reports feeling very well rested after 8 hours of sleep She reports that the hydroxyzine has been very helpful for her sleep -----------------------------------------------------------------------------------------     Medications: Outpatient Medications Prior to Visit  Medication Sig   amphetamine-dextroamphetamine (ADDERALL) 20 MG tablet Take 1 tablet (20 mg total) by mouth 2 (two) times daily.   hydrOXYzine (ATARAX) 25 MG tablet Take 1 tablet (25 mg total) by mouth every 6 (six) hours as needed.   lansoprazole (PREVACID) 30 MG capsule TAKE 1 CAPSULE BY MOUTH EVERY DAY IN THE MORNING   naproxen (NAPROSYN) 500 MG tablet Take 1 tablet (500 mg total) by mouth 2 (two) times daily as needed.   No facility-administered medications prior to visit.    Review of Systems     Objective    BP 119/70 (BP Location: Right Arm, Patient Position: Sitting, Cuff Size: Normal)   Pulse 64   Resp 16   Ht 5\' 5"  (1.651 m)   Wt 169 lb (76.7 kg)   SpO2 100%   BMI 28.12 kg/m    Physical Exam Vitals reviewed.  Constitutional:      General: She is not in acute distress.    Appearance: Normal appearance. She is not ill-appearing, toxic-appearing or diaphoretic.  Eyes:     Conjunctiva/sclera: Conjunctivae normal.  Cardiovascular:     Rate and Rhythm: Normal rate and regular rhythm.     Pulses: Normal pulses.     Heart sounds: Normal heart sounds. No murmur heard.    No friction rub. No gallop.  Pulmonary:     Effort: Pulmonary effort is normal. No respiratory distress.     Breath sounds: Normal breath sounds. No stridor. No wheezing, rhonchi or rales.  Abdominal:     General: Bowel sounds are normal. There is no distension.  Palpations: Abdomen is soft.     Tenderness: There is no abdominal tenderness.  Musculoskeletal:     Right lower leg: No edema.     Left lower leg: No edema.  Skin:    Findings: No erythema or rash.  Neurological:     Mental Status: She is alert and oriented to person, place, and time.       No results found for any visits on 11/23/21.  Assessment & Plan     Problem List Items Addressed This Visit       Other   Cannot sleep    Patient is doing well on hydroxyzine 25  mg Reports sleeping 8 hours and feeling well rested the next day Recommended she continue this prescription as prescribed No changes today      Overweight with body mass index (BMI) of 28 to 28.9 in adult - Primary    Patient is weight is steady at 169 pounds, BMI 28.12 Overall patient is reporting feeling subjectively well and has been successful in making several dietary and physical activity changes that will prove to be beneficial with time Encouraged her to continue with the changes that she is made Believe that with returning to the gym and exercising regularly along with resistance training and increasing protein, she will notice even more positive change in regards to weight management Congratulated her on the changes that she has been able to make in a short amount of time We will plan to follow-up in January        Return in about 8 weeks (around 01/19/2022) for ADHD medication.     I, [name], MD, have reviewed all documentation for this visit.  Portions of this information were initially documented by the CMA and reviewed by me for thoroughness and accuracy.      [name], MD  [facility] [phone] (phone) [phone] (fax)  [facility]

## 2021-11-23 NOTE — Assessment & Plan Note (Signed)
Patient is doing well on hydroxyzine 25 mg Reports sleeping 8 hours and feeling well rested the next day Recommended she continue this prescription as prescribed No changes today

## 2021-12-01 ENCOUNTER — Other Ambulatory Visit: Payer: Self-pay | Admitting: Family Medicine

## 2021-12-01 DIAGNOSIS — F902 Attention-deficit hyperactivity disorder, combined type: Secondary | ICD-10-CM

## 2021-12-02 MED ORDER — AMPHETAMINE-DEXTROAMPHETAMINE 20 MG PO TABS: 20.0000 mg | ORAL_TABLET | Freq: Two times a day (BID) | ORAL | 0 refills | Status: DC

## 2021-12-27 ENCOUNTER — Encounter: Payer: Self-pay | Admitting: Family Medicine

## 2021-12-29 MED ORDER — HYDROXYZINE HCL 25 MG PO TABS
25.0000 mg | ORAL_TABLET | Freq: Four times a day (QID) | ORAL | 5 refills | Status: DC | PRN
Start: 2021-12-29 — End: 2022-06-18

## 2022-01-18 NOTE — Progress Notes (Unsigned)
      Established patient visit   Patient: Krystal Marks   DOB: 28-Jan-1975   47 y.o. Female  MRN: [id number] Visit Date: 01/19/2022  Today's healthcare provider: [name], MD   No chief complaint on file.  Subjective    HPI   Symptoms that have improved:  ***   Difficulty taking the medicine: - Sleep: *** - Appetite: *** - Fatigue: ***  - Headaches: *** - Abdominal pain: *** - Tics: *** Other side effects: ***   Blood pressure:  ***   Medications: Outpatient Medications Prior to Visit  Medication Sig   amphetamine-dextroamphetamine (ADDERALL) 20 MG tablet Take 1 tablet (20 mg total) by mouth 2 (two) times daily.   hydrOXYzine (ATARAX) 25 MG tablet Take 1 tablet (25 mg total) by mouth every 6 (six) hours as needed.   lansoprazole (PREVACID) 30 MG capsule TAKE 1 CAPSULE BY MOUTH EVERY DAY IN THE MORNING   naproxen (NAPROSYN) 500 MG tablet Take 1 tablet (500 mg total) by mouth 2 (two) times daily as needed.   No facility-administered medications prior to visit.    Review of Systems  {Labs  Heme  Chem  Endocrine  Serology  Results Review (optional):23779}   Objective    There were no vitals taken for this visit. {Show previous vital signs (optional):23777}  Physical Exam  ***  No results found for any visits on 01/19/22.  Assessment & Plan     Problem List Items Addressed This Visit   None    No follow-ups on file.     I, [name], MD, have reviewed all documentation for this visit.  Portions of this information were initially documented by ***, and reviewed by me for thoroughness and accuracy.          [name], MD  [facility] [phone] (phone) [phone] (fax)  [place]

## 2022-01-19 ENCOUNTER — Ambulatory Visit: Payer: Managed Care, Other (non HMO) | Admitting: Family Medicine

## 2022-01-19 ENCOUNTER — Encounter: Payer: Self-pay | Admitting: Family Medicine

## 2022-01-19 VITALS — BP 119/74 | HR 63 | Ht 65.0 in | Wt 169.0 lb

## 2022-01-19 DIAGNOSIS — M25522 Pain in left elbow: Secondary | ICD-10-CM

## 2022-01-19 DIAGNOSIS — M25551 Pain in right hip: Secondary | ICD-10-CM | POA: Diagnosis not present

## 2022-01-19 DIAGNOSIS — M25521 Pain in right elbow: Secondary | ICD-10-CM | POA: Insufficient documentation

## 2022-01-19 DIAGNOSIS — M545 Low back pain, unspecified: Secondary | ICD-10-CM | POA: Diagnosis not present

## 2022-01-19 DIAGNOSIS — M25552 Pain in left hip: Secondary | ICD-10-CM

## 2022-01-19 DIAGNOSIS — F902 Attention-deficit hyperactivity disorder, combined type: Secondary | ICD-10-CM | POA: Diagnosis not present

## 2022-01-19 MED ORDER — KETOROLAC TROMETHAMINE 60 MG/2ML IM SOLN
30.0000 mg | Freq: Once | INTRAMUSCULAR | Status: AC
  Administered 2022-01-19: 30 mg via INTRAMUSCULAR

## 2022-01-19 MED ORDER — AMPHETAMINE-DEXTROAMPHETAMINE 20 MG PO TABS: 20.0000 mg | ORAL_TABLET | Freq: Two times a day (BID) | ORAL | 0 refills | Status: DC

## 2022-01-19 NOTE — Assessment & Plan Note (Signed)
Bilateral lateral elbow pain without obvious source of trama Point tenderness on both lateral elbows and right medial elbow  Suspect this is due to extensor tendinopathy vs valgus extension or radial tunnel syndrome  Will send for xrays to evaluate for bony changes  Toradol injection, 30mg  given today in office

## 2022-01-19 NOTE — Assessment & Plan Note (Signed)
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

## 2022-01-19 NOTE — Assessment & Plan Note (Signed)
Stable on current dose of one tablet twice daily of 20mg  adderall  Refills provided for 1 month

## 2022-01-19 NOTE — Patient Instructions (Addendum)
Please report to [facility] located at:  [place], [place]  You do not need an appointment to have xrays completed.   Our office will follow up with  results once available.   1

## 2022-01-19 NOTE — Assessment & Plan Note (Signed)
Lumbar pain with some tenderness extending into iliopsoas regional  Likely due to tendinopathy  Will order XR of lumbar back, pelvis  Referral submitted for PT per patient request

## 2022-02-08 ENCOUNTER — Telehealth: Payer: Self-pay | Admitting: Family Medicine

## 2022-02-08 DIAGNOSIS — G43919 Migraine, unspecified, intractable, without status migrainosus: Secondary | ICD-10-CM

## 2022-02-08 NOTE — Telephone Encounter (Signed)
Medication Refill - Medication: SUMAtriptan (IMITREX) 100 MG tablet   Has the patient contacted their pharmacy? Yes.   (Agent: If no, request that the patient contact the pharmacy for the refill. If patient does not wish to contact the pharmacy document the reason why and proceed with request.) (Agent: If yes, when and what did the pharmacy advise?)  Preferred Pharmacy (with phone number or street name):   [facility] [address]  Has the patient been seen for an appointment in the last year OR does the patient have an upcoming appointment? Yes.    Agent: Please be advised that RX refills may take up to 3 business days. We ask that you follow-up with your pharmacy.

## 2022-02-09 MED ORDER — SUMATRIPTAN SUCCINATE 100 MG PO TABS: 100.0000 mg | ORAL_TABLET | ORAL | 0 refills | Status: DC | PRN

## 2022-02-09 NOTE — Telephone Encounter (Signed)
RX for imitrex 100mg  sent to requested pharmacy.   [name], MD  [facility]

## 2022-02-16 ENCOUNTER — Ambulatory Visit: Payer: Managed Care, Other (non HMO) | Admitting: Family Medicine

## 2022-02-16 ENCOUNTER — Other Ambulatory Visit (HOSPITAL_COMMUNITY)
Admission: RE | Admit: 2022-02-16 | Discharge: 2022-02-16 | Disposition: A | Discharge status: Home / Self Care | Payer: Managed Care, Other (non HMO) | Source: Ambulatory Visit | Attending: Family Medicine | Admitting: Family Medicine

## 2022-02-16 ENCOUNTER — Encounter: Payer: Self-pay | Admitting: Family Medicine

## 2022-02-16 VITALS — BP 119/81 | HR 75 | Resp 16 | Wt 168.3 lb

## 2022-02-16 DIAGNOSIS — Z124 Encounter for screening for malignant neoplasm of cervix: Secondary | ICD-10-CM | POA: Diagnosis present

## 2022-02-16 DIAGNOSIS — M545 Low back pain, unspecified: Secondary | ICD-10-CM | POA: Diagnosis not present

## 2022-02-16 DIAGNOSIS — M25522 Pain in left elbow: Secondary | ICD-10-CM

## 2022-02-16 DIAGNOSIS — G43919 Migraine, unspecified, intractable, without status migrainosus: Secondary | ICD-10-CM | POA: Diagnosis not present

## 2022-02-16 DIAGNOSIS — F900 Attention-deficit hyperactivity disorder, predominantly inattentive type: Secondary | ICD-10-CM

## 2022-02-16 DIAGNOSIS — R350 Frequency of micturition: Secondary | ICD-10-CM | POA: Diagnosis not present

## 2022-02-16 DIAGNOSIS — M25521 Pain in right elbow: Secondary | ICD-10-CM | POA: Diagnosis not present

## 2022-02-16 DIAGNOSIS — Z1211 Encounter for screening for malignant neoplasm of colon: Secondary | ICD-10-CM | POA: Insufficient documentation

## 2022-02-16 DIAGNOSIS — K219 Gastro-esophageal reflux disease without esophagitis: Secondary | ICD-10-CM

## 2022-02-16 DIAGNOSIS — F902 Attention-deficit hyperactivity disorder, combined type: Secondary | ICD-10-CM

## 2022-02-16 LAB — POCT URINALYSIS DIPSTICK
Bilirubin, UA: NEGATIVE
Glucose, UA: NEGATIVE
Ketones, UA: NEGATIVE
Leukocytes, UA: NEGATIVE
Nitrite, UA: NEGATIVE
Protein, UA: NEGATIVE
Spec Grav, UA: 1.025 (ref 1.010–1.025)
Urobilinogen, UA: 0.2 E.U./dL
pH, UA: 6 (ref 5.0–8.0)

## 2022-02-16 MED ORDER — LANSOPRAZOLE 30 MG PO CPDR: DELAYED_RELEASE_CAPSULE | ORAL | 1 refills | Status: DC

## 2022-02-16 MED ORDER — AMPHETAMINE-DEXTROAMPHETAMINE 20 MG PO TABS: 20.0000 mg | ORAL_TABLET | Freq: Two times a day (BID) | ORAL | 0 refills | Status: DC

## 2022-02-16 MED ORDER — KETOROLAC TROMETHAMINE 60 MG/2ML IM SOLN
60.0000 mg | Freq: Once | INTRAMUSCULAR | Status: AC
  Administered 2022-02-16: 30 mg via INTRAMUSCULAR

## 2022-02-16 MED ORDER — CEPHALEXIN 500 MG PO CAPS: 500.0000 mg | ORAL_CAPSULE | Freq: Three times a day (TID) | ORAL | 0 refills | Status: AC

## 2022-02-16 NOTE — Assessment & Plan Note (Signed)
Improved  Toradol 41m IM given today

## 2022-02-16 NOTE — Assessment & Plan Note (Signed)
Subacute Intermittent,improved   Patient requested toradol injection  She received 24m dose today  Symptoms improved since prior visit

## 2022-02-16 NOTE — Assessment & Plan Note (Signed)
Chronic  Stable  Refills for PPI submitted  Continue lansoprazole 47m daily

## 2022-02-16 NOTE — Assessment & Plan Note (Signed)
Pap smear collected  Will follow up with results once available

## 2022-02-16 NOTE — Assessment & Plan Note (Signed)
Referral for gastroenterology submitted  Patient will schedule

## 2022-02-16 NOTE — Assessment & Plan Note (Signed)
Chronic  Symptoms controlled on adderall  Continue adderall 45m BID  Refills provided today

## 2022-02-16 NOTE — Progress Notes (Signed)
I,Joseline E Rosas,acting as a scribe for [employer], MD.,have documented all relevant documentation on the behalf of [name], MD,as directed by  [name], MD while in the presence of [name], MD.   Established patient visit   Patient: Krystal Marks   DOB: Mar 03, 1975   47 y.o. Female  MRN: [id number] Visit Date: 02/16/2022  Today's healthcare provider: [name], MD   Chief Complaint  Patient presents with   follow-up back pain/ elbow pain   pap only   Subjective    HPI  Follow up for elbow and back pain  The patient was last seen for this 4 weeks ago. Changes made at last visit include referral to PT. She has not been to PT due to scheduling concerns.  Patient is requesting additional toradol injection as it helped with pain   Screening for Cervical Cancer  Patient also here for pap smear.  Urinary Symptoms  Patient reports having frequent urination. Reports that last week she was having painful urination and took some AZO. She denies back pain associated with urinary symptoms  LMS ended 02/06/22    Migraine  Last week nausea and lightheadedness during migraine episodes  Has three migraine headaches in one week after not having them for 3 years  Thinks may be due to increased stress, anxiety and menses  Relieved by sumatriptan  She is now using imitrex  Patient reports that she previously used injectable triptan years ago Denies needing refills today   Other concerns for follow up: Patient states she has noticed easily bruising  She denies having any trauma or concerns for safety in the home   Medications: Outpatient Medications Prior to Visit  Medication Sig   hydrOXYzine (ATARAX) 25 MG tablet Take 1 tablet (25 mg total) by mouth every 6 (six) hours as needed.   SUMAtriptan (IMITREX) 100 MG tablet Take 1 tablet (100 mg total) by mouth every 2 (two) hours as needed for migraine. May  repeat in 2 hours if headache persists or recurs.   [DISCONTINUED] amphetamine-dextroamphetamine (ADDERALL) 20 MG tablet Take 1 tablet (20 mg total) by mouth 2 (two) times daily.   [DISCONTINUED] lansoprazole (PREVACID) 30 MG capsule TAKE 1 CAPSULE BY MOUTH EVERY DAY IN THE MORNING   No facility-administered medications prior to visit.    Review of Systems     Objective    BP 119/81 (BP Location: Right Arm, Patient Position: Sitting, Cuff Size: Normal)   Pulse 75   Resp 16   Wt 168 lb 4.8 oz (76.3 kg)   BMI 28.01 kg/m    Physical Exam Exam conducted with a chaperone present.  Eyes:     Extraocular Movements: Extraocular movements intact.     Conjunctiva/sclera: Conjunctivae normal.  Pulmonary:     Effort: Pulmonary effort is normal. No respiratory distress.  Genitourinary:    General: Normal vulva.     Pubic Area: No rash or pubic lice.      Labia:        Right: No rash, tenderness, lesion or injury.        Left: No rash, tenderness, lesion or injury.      Urethra: No prolapse or urethral swelling.     Vagina: Normal.     Cervix: No cervical motion tenderness, erythema or eversion.     Uterus: Normal. Not enlarged, not fixed and not tender.      Adnexa: Right adnexa normal and left adnexa normal.  Right: No mass or tenderness.         Left: No mass or tenderness.    Neurological:     General: No focal deficit present.     Mental Status: She is oriented to person, place, and time.     Cranial Nerves: No cranial nerve deficit.     Motor: No weakness.     Gait: Gait normal.  Psychiatric:        Mood and Affect: Mood normal.       Results for orders placed or performed in visit on 02/16/22  POCT urinalysis dipstick  Result Value Ref Range   Color, UA Yellow    Clarity, UA Clear    Glucose, UA Negative Negative   Bilirubin, UA Negative    Ketones, UA Negative    Spec Grav, UA 1.025 1.010 - 1.025   Blood, UA Moderate    pH, UA 6.0 5.0 - 8.0   Protein, UA  Negative Negative   Urobilinogen, UA 0.2 0.2 or 1.0 E.U./dL   Nitrite, UA Negative    Leukocytes, UA Negative Negative   Appearance     Odor      Assessment & Plan     Problem List Items Addressed This Visit       Cardiovascular and Mediastinum   Migraine    Chronic  Recently has become more frequent  Symptoms are controlled with imitrex  Recommended that she continue this abortive therapy  Patient declined offer to refer to neurology  If symptoms persist, will trial other agents for maintenance therapy for migraine  No medication changes or refills needed today         Digestive   Acid reflux    Chronic  Stable  Refills for PPI submitted  Continue lansoprazole 85m daily        Relevant Medications   lansoprazole (PREVACID) 30 MG capsule     Other   ADD (attention deficit disorder)   Relevant Medications   amphetamine-dextroamphetamine (ADDERALL) 20 MG tablet (Start on 02/19/2022)   Attention deficit hyperactivity disorder, predominantly inattentive type    Chronic  Symptoms controlled on adderall  Continue adderall 266mBID  Refills provided today        Pain of both elbows    Improved  Toradol 3046mM given today       Acute bilateral low back pain without sciatica    Subacute Intermittent,improved   Patient requested toradol injection  She received 4m74mse today  Symptoms improved since prior visit         Frequent urination - Primary    Acute  Urinary frequency  U/A showed moderate blood  Will repeat U/A in 6 weeks  Will treat empirically for UTI  Prescribed Keflex 500 TID for 7 days  Urine culture collected today  F/u PRN       Relevant Orders   POCT urinalysis dipstick (Completed)   Urine Microscopic   Urine Culture   Screening for colon cancer    Referral for gastroenterology submitted  Patient will schedule        Relevant Orders   Ambulatory referral to Gastroenterology   Screening for cervical cancer    Pap smear  collected  Will follow up with results once available        Relevant Orders   Cytology - PAP     Return in about 6 weeks (around 03/30/2022) for hematuria .        The entirety of  the information documented in the History of Present Illness, Review of Systems and Physical Exam were personally obtained by me. Portions of this information were initially documented by Lyndel Pleasure, CMA and reviewed by me for thoroughness and accuracy.[name], MD     [name], MD  [facility] [phone] (phone) [phone] (fax)  [place]

## 2022-02-16 NOTE — Assessment & Plan Note (Signed)
Chronic  Recently has become more frequent  Symptoms are controlled with imitrex  Recommended that she continue this abortive therapy  Patient declined offer to refer to neurology  If symptoms persist, will trial other agents for maintenance therapy for migraine  No medication changes or refills needed today

## 2022-02-16 NOTE — Patient Instructions (Addendum)
We will follow up with results of your pap smear in the next 2-3 days.    We will follow up with the urine culture results once available to help guide treatment for your urinary symptoms.   I have submitted the referral for your colonoscopy.

## 2022-02-16 NOTE — Assessment & Plan Note (Signed)
Acute  Urinary frequency  U/A showed moderate blood  Will repeat U/A in 6 weeks  Will treat empirically for UTI  Prescribed Keflex 500 TID for 7 days  Urine culture collected today  F/u PRN

## 2022-02-19 LAB — CYTOLOGY - PAP
Comment: NEGATIVE
Diagnosis: NEGATIVE
Diagnosis: REACTIVE
High risk HPV: NEGATIVE

## 2022-02-22 LAB — URINALYSIS, MICROSCOPIC ONLY
Casts: NONE SEEN /lpf
Epithelial Cells (non renal): >10 /hpf — AB (ref 0–10)

## 2022-02-22 LAB — URINE CULTURE

## 2022-02-24 ENCOUNTER — Encounter: Payer: Self-pay | Admitting: *Deleted

## 2022-02-26 ENCOUNTER — Other Ambulatory Visit: Payer: Self-pay | Admitting: Family Medicine

## 2022-02-26 DIAGNOSIS — F902 Attention-deficit hyperactivity disorder, combined type: Secondary | ICD-10-CM

## 2022-02-26 MED ORDER — AMPHETAMINE-DEXTROAMPHETAMINE 20 MG PO TABS: 20.0000 mg | ORAL_TABLET | Freq: Two times a day (BID) | ORAL | 0 refills | Status: DC

## 2022-03-24 NOTE — Progress Notes (Unsigned)
      Established patient visit   Patient: Krystal Marks   DOB: 28-Jul-1975   47 y.o. Female  MRN: [id number] Visit Date: 03/25/2022  Today's healthcare provider: [name], MD   No chief complaint on file.  Subjective    HPI    Medications: Outpatient Medications Prior to Visit  Medication Sig   amphetamine-dextroamphetamine (ADDERALL) 20 MG tablet Take 1 tablet (20 mg total) by mouth 2 (two) times daily.   hydrOXYzine (ATARAX) 25 MG tablet Take 1 tablet (25 mg total) by mouth every 6 (six) hours as needed.   lansoprazole (PREVACID) 30 MG capsule TAKE 1 CAPSULE BY MOUTH EVERY DAY IN THE MORNING   SUMAtriptan (IMITREX) 100 MG tablet Take 1 tablet (100 mg total) by mouth every 2 (two) hours as needed for migraine. May repeat in 2 hours if headache persists or recurs.   No facility-administered medications prior to visit.    Review of Systems  {Labs  Heme  Chem  Endocrine  Serology  Results Review (optional):23779}   Objective    There were no vitals taken for this visit. {Show previous vital signs (optional):23777}  Physical Exam  ***  No results found for any visits on 03/25/22.  Assessment & Plan     ***  No follow-ups on file.      {provider attestation***:1}   [name], MD  [facility] [phone] (phone) [phone] (fax)  [facility]

## 2022-03-25 ENCOUNTER — Ambulatory Visit (INDEPENDENT_AMBULATORY_CARE_PROVIDER_SITE_OTHER): Payer: Managed Care, Other (non HMO) | Admitting: Family Medicine

## 2022-03-25 VITALS — BP 122/79 | HR 73 | Temp 98.2°F | Resp 12 | Ht 65.0 in | Wt 171.0 lb

## 2022-03-25 DIAGNOSIS — N912 Amenorrhea, unspecified: Secondary | ICD-10-CM | POA: Insufficient documentation

## 2022-03-25 NOTE — Assessment & Plan Note (Addendum)
Acute problem  Likely etiologies include pregnancy, also considering onset of perimenopause Ordered BetaHCG  Quant today Follow up w/ results once positive

## 2022-03-25 NOTE — Progress Notes (Signed)
Acute Office Visit  Subjective:     Patient ID: Krystal Marks, female    DOB: 05/30/1975, 47 y.o.   MRN: [id number]  Chief Complaint  Patient presents with   Period is late    HPI Krystal Marks is a 47 year old female presenting today for missed menses. Last menses was on 02/05/2022. Menses are regular and occur every 27 days, lasting 5-7 days. Home pregnancy test taken on Sunday was negative. She reports taking three home pregnancy tests that were all negative. She has noted hot flashes at night and changes in her cravings. Patient does not have any other concerns today.  She denies nipple drainage. States that her areolas are darkening. She also reports fatigue and increased urinary frequency.   PMH  Miscarriage in 2002 at 8 weeks. 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  Medications Still taking hydroxyzine at night Takes Adderall once a day as needed.  Still taking PPI. Not taking the sumatriptan, uses tylenol for headaches.    FH Mother went through menopause in the 63s.  Grandmother went through early menopause, patient is not sure of what age.    Review of Systems  Constitutional:  Positive for malaise/fatigue.  HENT:  Positive for congestion and nosebleeds.   Gastrointestinal:  Positive for abdominal pain and constipation. Negative for nausea and vomiting.       Bloating   Genitourinary:  Positive for frequency and urgency. Negative for dysuria.  Musculoskeletal:  Positive for back pain.       Breast tenderness, areola darkening, no drainage.  Neurological:  Positive for dizziness and headaches.  Psychiatric/Behavioral:         Mood swings, brain fog.         Objective:    BP 122/79 (BP Location: Left Arm, Patient Position: Sitting, Cuff Size: Normal)   Pulse 73   Temp 98.2 F (36.8 C) (Oral)   Resp 12   Ht 5\' 5"  (1.651 m)   Wt 171 lb (77.6 kg)   LMP 02/05/2022   BMI 28.46 kg/m    Physical Exam Constitutional:      Appearance: Normal appearance.  HENT:     Head:  Normocephalic and atraumatic.     Nose: Nose normal.  Cardiovascular:     Pulses: Normal pulses.     Heart sounds: Normal heart sounds.  Pulmonary:     Effort: Pulmonary effort is normal.     Breath sounds: Normal breath sounds.  Abdominal:     General: Bowel sounds are normal.     Palpations: Abdomen is soft.  Musculoskeletal:     Right lower leg: No edema.     Left lower leg: No edema.  Skin:    General: Skin is warm and dry.  Neurological:     Mental Status: She is alert.  Psychiatric:        Mood and Affect: Mood normal.        Behavior: Behavior normal.     No results found for any visits on 03/25/22.      Assessment & Plan:   Problem List Items Addressed This Visit       Other   Amenorrhea - Primary    Acute problem  Likely etiologies include pregnancy, also considering onset of perimenopause Ordered BetaHCG  Quant today Follow up w/ results once positive       Relevant Orders   Beta HCG, Quant    No orders of the defined types were placed in this encounter.  [place] [facility]

## 2022-03-25 NOTE — Patient Instructions (Addendum)
Today we will check lab work and follow up with results once available

## 2022-03-26 LAB — BETA HCG QUANT (REF LAB): hCG Quant: <1 m[IU]/mL

## 2022-03-28 IMAGING — CT CT ABD-PEL WO/W CM
3 of 9 series · 15 of 46 positions shown, 17 images · IV contrast (omnipaque)
Comparison: Abdomen CT 12/20/2014.

CLINICAL DATA: Right lower quadrant abdominal pain. Patient with
known iodinated contrast allergy was given 13 hour steroid
premedication protocol and tolerated intravenous contrast without
complication.

EXAM:
CT ABDOMEN AND PELVIS WITHOUT AND WITH CONTRAST
TECHNIQUE: Multidetector CT imaging of the abdomen and pelvis was performed
following the standard protocol before and following the bolus
administration of intravenous contrast.
CONTRAST:  100mL OMNIPAQUE IOHEXOL 300 MG/ML  SOLN

[Series 2: abd/pel pre · axial · non-contrast · 0.77mm/px · z∈[-1015,-685]mm · 9 of 84 slices shown, 11 images]
[im 9/84  soft-tissue]
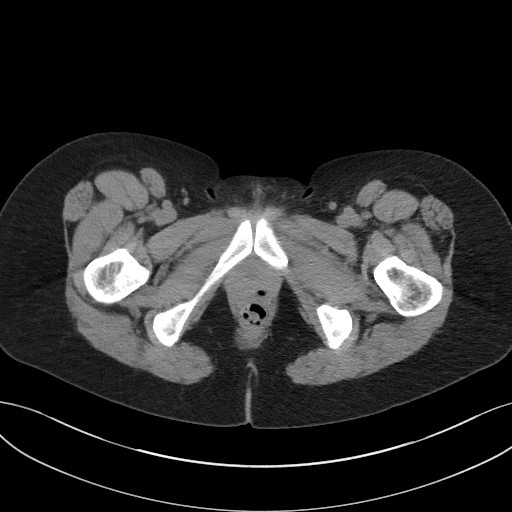
[im 9/84  bone]
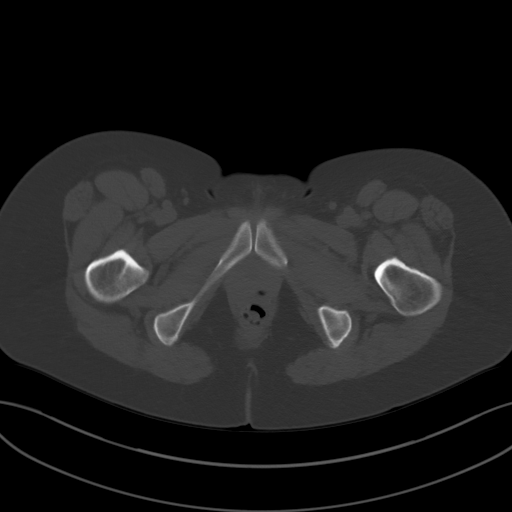
[im 17/84  soft-tissue]
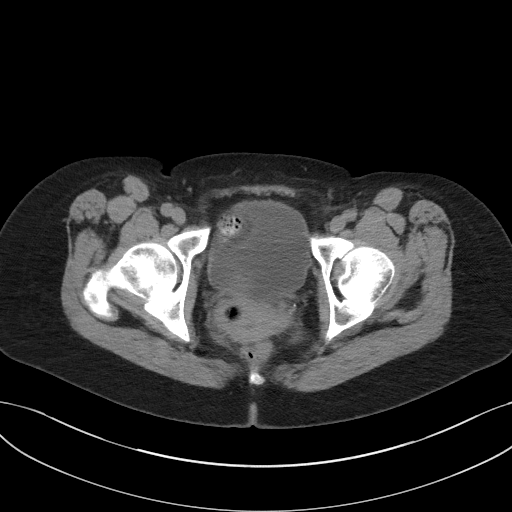
[im 25/84  soft-tissue]
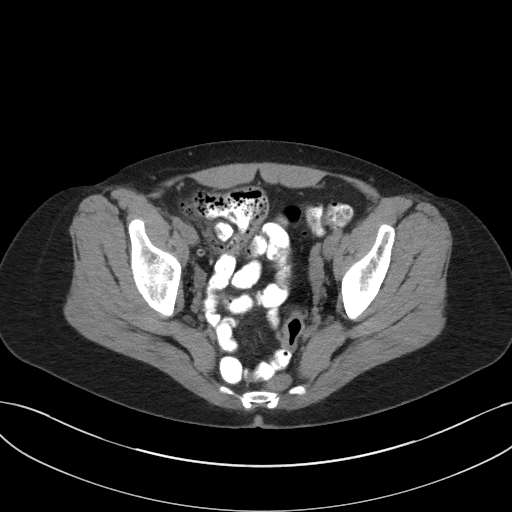
[im 34/84  soft-tissue]
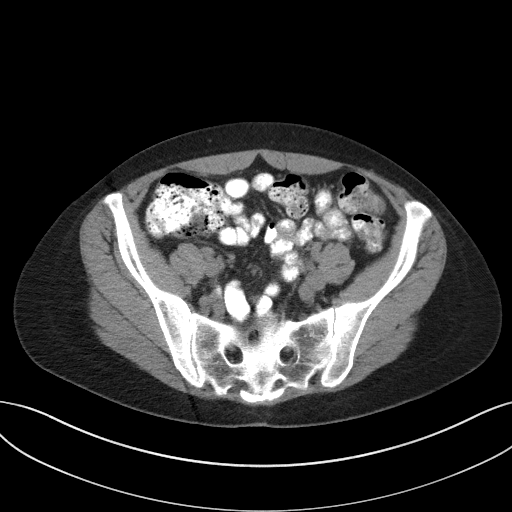
[im 42/84  soft-tissue]
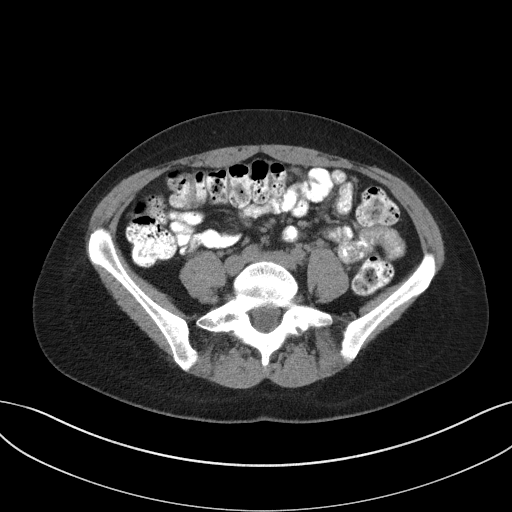
[im 50/84  soft-tissue]
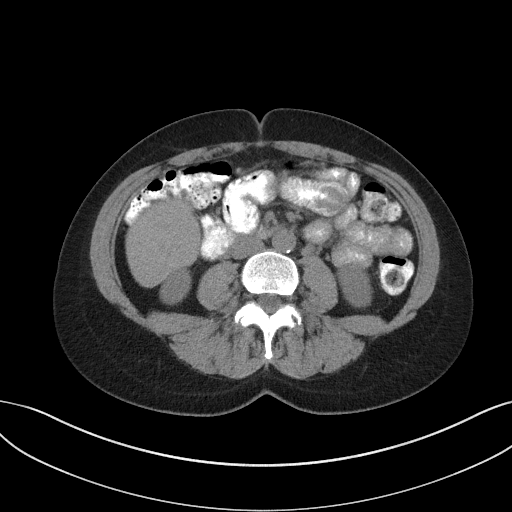
[im 59/84  soft-tissue]
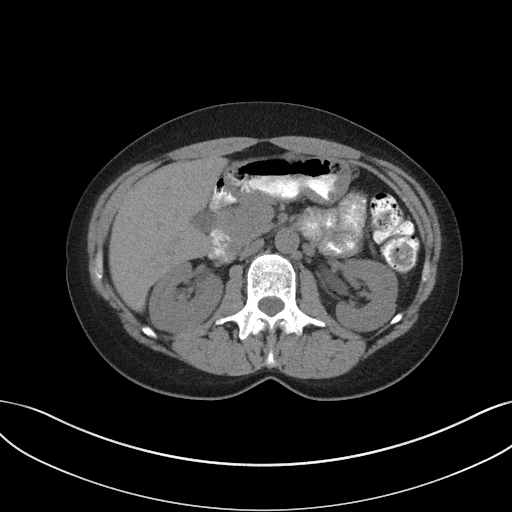
[im 67/84  soft-tissue]
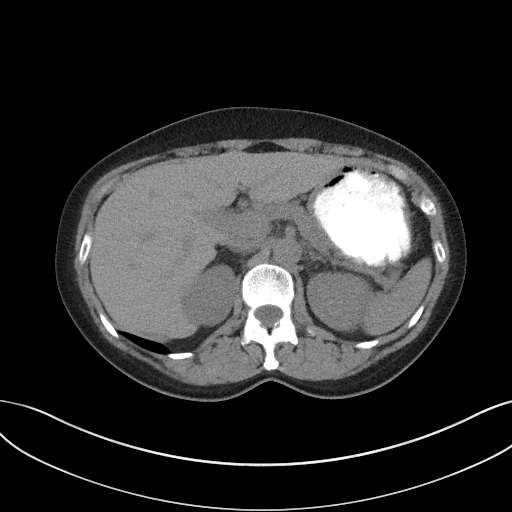
[im 75/84  soft-tissue]
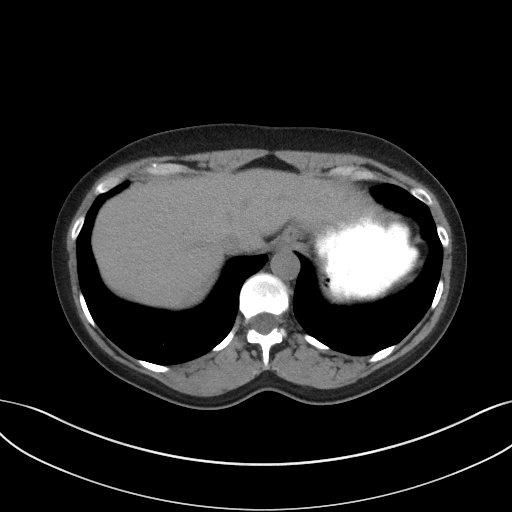
[im 75/84  bone]
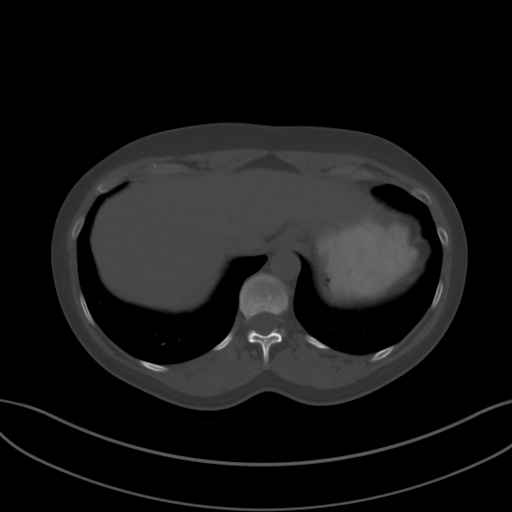

[Series 4: abd/pel post · axial · 0.80mm/px · z∈[-1018,-933]mm · 3 of 85 slices shown]
[im 9/85  soft-tissue]
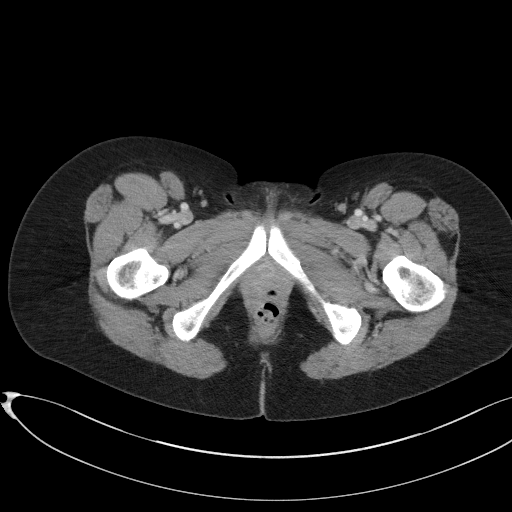
[im 17/85  soft-tissue]
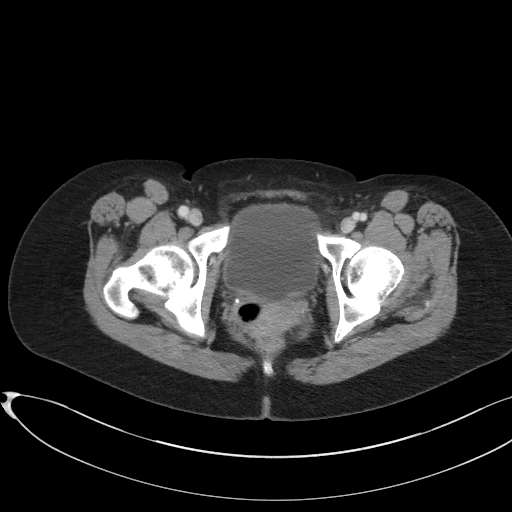
[im 26/85  soft-tissue]
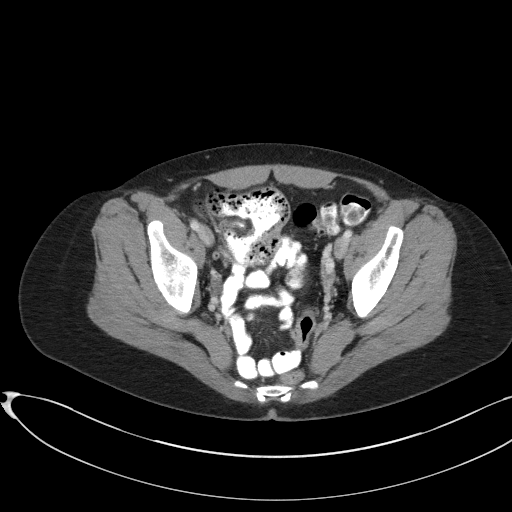

[Series 8: coronal st · coronal · 0.65mm/px · 3 of 85 slices shown]
[im 22/85  soft-tissue]
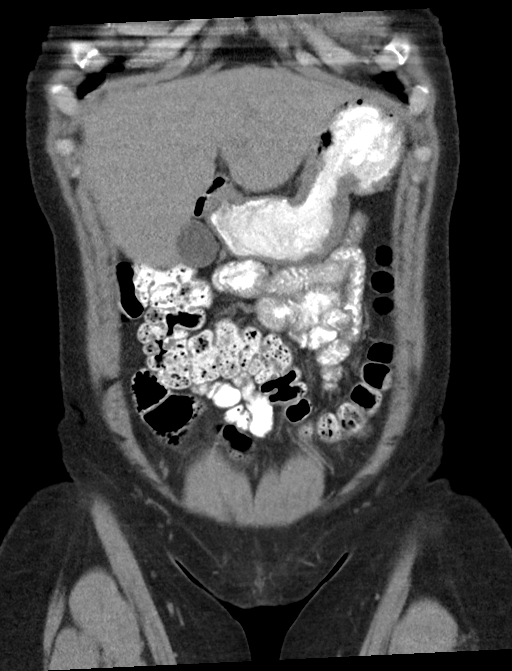
[im 43/85  soft-tissue]
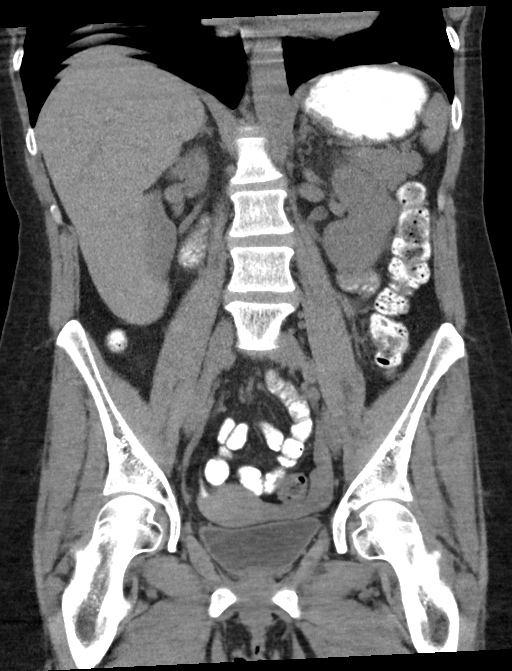
[im 64/85  soft-tissue]
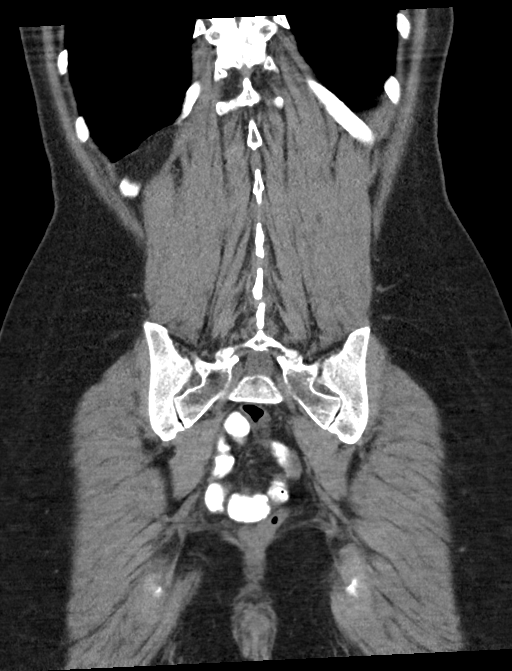

[15 of 46 positions shown; findings below may reference images not displayed]

FINDINGS: Lower chest: Unremarkable.

Hepatobiliary: No suspicious focal abnormality within the liver
parenchyma. There is no evidence for gallstones, gallbladder wall
thickening, or pericholecystic fluid. No intrahepatic or
extrahepatic biliary dilation.

Pancreas: No focal mass lesion. No dilatation of the main duct. No
intraparenchymal cyst. No peripancreatic edema.

Spleen: No splenomegaly. No focal mass lesion.

Adrenals/Urinary Tract: No adrenal nodule or mass. No stones are
seen in the right kidney or ureter 1 mm nonobstructing stone
identified lower pole left kidney, visible on axial [DATE] and well
demonstrated on coronal 47/8. No left ureteral or bladder stones. No
secondary changes in either kidney or ureter.

Imaging after IV contrast administration shows no suspicious
enhancing lesion in either kidney.

Delayed post-contrast imaging shows no wall thickening or soft
tissue filling defect in either intrarenal collecting system or
renal pelvis.

Stomach/Bowel: Stomach is unremarkable. No gastric wall thickening.
No evidence of outlet obstruction. Duodenum is normally positioned
as is the ligament of Treitz. No small bowel wall thickening. No
small bowel dilatation. The terminal ileum is normal. The appendix
is normal. No gross colonic mass. No colonic wall thickening.

Vascular/Lymphatic: No abdominal aortic aneurysm. There is no
gastrohepatic or hepatoduodenal ligament lymphadenopathy. No
retroperitoneal or mesenteric lymphadenopathy. No pelvic sidewall
lymphadenopathy.

Reproductive: The uterus is unremarkable.  There is no adnexal mass.

Other: No intraperitoneal free fluid.

Musculoskeletal: No worrisome lytic or sclerotic osseous
abnormality.
IMPRESSION: 1. No acute findings in the abdomen or pelvis. Specifically, no
findings to explain the patient's history of right lower quadrant
pain.
2. 1 mm nonobstructing stone lower pole left kidney.

## 2022-03-29 ENCOUNTER — Encounter: Payer: Self-pay | Admitting: Family Medicine

## 2022-04-07 NOTE — Progress Notes (Unsigned)
I,Joseline E Rosas,acting as a scribe for [employer], MD.,have documented all relevant documentation on the behalf of [name], MD,as directed by  [name], MD while in the presence of [name], MD.   Established patient visit   Patient: Krystal Marks   DOB: 1975-04-27   47 y.o. Female  MRN: [id number] Visit Date: 04/08/2022  Today's healthcare provider: [name], MD   Chief Complaint  Patient presents with   Follow-up   Subjective    HPI   Follow-up for amenorrhea Patient was seen on 03/25/2022 for amenorrhea She had quantitative beta-hCG testing at that time that was not consistent with pregnancy Today she reports she's still having period symptoms. Patient reports she stopped for 2 days about a week or so again. She's having breat tenderness, blue veins across breast. Dizziness which is worsen.  Eating more sugars, which isn't like her. Patients reports headaches, having watery discharge. Discharge is also white and creamy but states this is normal for her cervical mucous  Patient took a pregnancy test 4/3 and it reports to be negative.  She states that she has not had nipple discharge  States she has not been sexually active recently  Reports that her last menses was in [place] and is over 30 days late for her expected period  She states that she has been reviewing literature and has questions about potential twin pregnancy that can cause false results  She states that she was able to check her cervix and that it was "soft and low"  Her prior OB/GYN office would not be able to accept her insurance until the end of April and so she is now considering seeing a gynecologist at [facility] if she continues to have abnormal symptoms    Medications: Outpatient Medications Prior to Visit  Medication Sig   amphetamine-dextroamphetamine (ADDERALL) 20 MG tablet Take 1 tablet (20 mg total) by mouth 2 (two) times  daily.   hydrOXYzine (ATARAX) 25 MG tablet Take 1 tablet (25 mg total) by mouth every 6 (six) hours as needed.   lansoprazole (PREVACID) 30 MG capsule TAKE 1 CAPSULE BY MOUTH EVERY DAY IN THE MORNING   SUMAtriptan (IMITREX) 100 MG tablet Take 1 tablet (100 mg total) by mouth every 2 (two) hours as needed for migraine. May repeat in 2 hours if headache persists or recurs.   No facility-administered medications prior to visit.    Review of Systems     Objective    BP 107/75 (BP Location: Left Arm, Patient Position: Sitting, Cuff Size: Normal)   Pulse 61   Temp 98 F (36.7 C) (Oral)   Resp 12   Ht 5\' 5"  (1.651 m)   Wt 170 lb 4.8 oz (77.2 kg)   LMP 02/05/2022   BMI 28.34 kg/m    Physical Exam Neck:     Thyroid: No thyroid mass, thyromegaly or thyroid tenderness.  Pulmonary:     Effort: Pulmonary effort is normal. No respiratory distress.  Abdominal:     General: Abdomen is flat. Bowel sounds are normal. There is no distension.     Palpations: Abdomen is soft. There is no fluid wave, hepatomegaly, splenomegaly or mass.     Tenderness: There is no abdominal tenderness. There is no guarding or rebound.     Hernia: No hernia is present.      No results found for any visits on 04/08/22.  Assessment & Plan     Problem List Items Addressed This Visit  Other   Amenorrhea - Primary    Persistent problem  Pt is requesting to check beta quant levels  Also discussed potential of perimenopause among other hormonal abnormalities  Recommended that if labs are abnormal, consider TVUS Will order beta hcg quant, FSH, LH, PRL and TSH with free T 4 today  Follow up in 3 weeks or sooner depending on results       Relevant Orders   FSH/LH   Beta HCG, Quant   TSH + free T4   Prolactin   Other Visit Diagnoses     Breast tenderness in female       Relevant Orders   FSH/LH   Beta HCG, Quant   TSH + free T4   Prolactin        Return in about 3 weeks (around 04/29/2022)  for amenorrhea.        The entirety of the information documented in the History of Present Illness, Review of Systems and Physical Exam were personally obtained by me. Portions of this information were initially documented by Lyndel Pleasure, CMA. I, [name], MD have reviewed the documentation above for thoroughness and accuracy.   [name], MD  [facility] [phone] (phone) [phone] (fax)  [place]

## 2022-04-08 ENCOUNTER — Ambulatory Visit (INDEPENDENT_AMBULATORY_CARE_PROVIDER_SITE_OTHER): Payer: Managed Care, Other (non HMO) | Admitting: Family Medicine

## 2022-04-08 ENCOUNTER — Encounter: Payer: Self-pay | Admitting: Family Medicine

## 2022-04-08 VITALS — BP 107/75 | HR 61 | Temp 98.0°F | Resp 12 | Ht 65.0 in | Wt 170.3 lb

## 2022-04-08 DIAGNOSIS — N644 Mastodynia: Secondary | ICD-10-CM | POA: Diagnosis not present

## 2022-04-08 DIAGNOSIS — N912 Amenorrhea, unspecified: Secondary | ICD-10-CM

## 2022-04-08 NOTE — Assessment & Plan Note (Signed)
Persistent problem  Pt is requesting to check beta quant levels  Also discussed potential of perimenopause among other hormonal abnormalities  Recommended that if labs are abnormal, consider TVUS Will order beta hcg quant, FSH, LH, PRL and TSH with free T 4 today  Follow up in 3 weeks or sooner depending on results

## 2022-04-08 NOTE — Patient Instructions (Signed)
It was a pleasure to see you today!  Thank you for choosing [facility] for your primary care.   Krystal Marks was seen for amenorrhea.   Our plans for today were: We will test hormone levels today  We will also retest for the pregnancy quantitative levels and follow up with your once results are available.     Best Wishes,   Dr. Quentin Cornwall

## 2022-04-09 LAB — TSH+FREE T4
Free T4: 1.02 ng/dL (ref 0.82–1.77)
TSH: 2.65 u[IU]/mL (ref 0.450–4.500)

## 2022-04-09 LAB — FSH/LH
FSH: 5.6 m[IU]/mL
LH: 8.3 m[IU]/mL

## 2022-04-09 LAB — BETA HCG QUANT (REF LAB): hCG Quant: <1 m[IU]/mL

## 2022-04-09 LAB — PROLACTIN: Prolactin: 11.7 ng/mL (ref 4.8–33.4)

## 2022-04-27 NOTE — Progress Notes (Unsigned)
     Patient did not show for appointment scheduled for 04/28/22 at 10:20AM

## 2022-04-28 ENCOUNTER — Ambulatory Visit (INDEPENDENT_AMBULATORY_CARE_PROVIDER_SITE_OTHER): Payer: Self-pay | Admitting: Family Medicine

## 2022-04-28 ENCOUNTER — Encounter: Payer: Self-pay | Admitting: Family Medicine

## 2022-04-28 DIAGNOSIS — Z91199 Patient's noncompliance with other medical treatment and regimen due to unspecified reason: Secondary | ICD-10-CM

## 2022-05-31 ENCOUNTER — Other Ambulatory Visit: Payer: Self-pay | Admitting: Family Medicine

## 2022-05-31 DIAGNOSIS — F902 Attention-deficit hyperactivity disorder, combined type: Secondary | ICD-10-CM

## 2022-06-01 MED ORDER — AMPHETAMINE-DEXTROAMPHETAMINE 20 MG PO TABS: 20.0000 mg | ORAL_TABLET | Freq: Two times a day (BID) | ORAL | 0 refills | Status: DC

## 2022-06-04 ENCOUNTER — Other Ambulatory Visit: Payer: Self-pay | Admitting: Family Medicine

## 2022-06-04 NOTE — Telephone Encounter (Signed)
Unable to refill per protocol, Rx request is too soon. Last refill 12/29/21 for 90 and 5 refills.  Requested Prescriptions  Pending Prescriptions Disp Refills   hydrOXYzine (ATARAX) 25 MG tablet [Pharmacy Med Name: HYDROXYZINE HCL 25 MG TABLET] 90 tablet 5    Sig: TAKE 1 TABLET BY MOUTH EVERY 6 HOURS AS NEEDED.     Ear, Nose, and Throat:  Antihistamines 2 Failed - 06/04/2022  2:43 AM      Failed - Cr in normal range and within 360 days    Creatinine, Ser  Date Value Ref Range Status  04/17/2021 0.82 0.57 - 1.00 mg/dL Final         Passed - Valid encounter within last 12 months    Recent Outpatient Visits           1 month ago No-show for appointment   [facility] Simmons[place]Robinson, [place], MD   1 month ago Amenorrhea   [place] [facility] [place], [place], MD   2 months ago Amenorrhea   [facility] [place], [place], MD   3 months ago Frequent urination   [place] [facility] [place], [place], MD   4 months ago Pain of both elbows    [facility] [place], Tawanna Cooler, MD

## 2022-06-15 ENCOUNTER — Encounter: Payer: Self-pay | Admitting: Family Medicine

## 2022-06-15 ENCOUNTER — Other Ambulatory Visit: Payer: Self-pay | Admitting: Family Medicine

## 2022-06-15 DIAGNOSIS — G43919 Migraine, unspecified, intractable, without status migrainosus: Secondary | ICD-10-CM

## 2022-06-16 ENCOUNTER — Other Ambulatory Visit: Payer: Self-pay | Admitting: Family Medicine

## 2022-06-16 NOTE — Telephone Encounter (Signed)
Requested Prescriptions  Pending Prescriptions Disp Refills   SUMAtriptan (IMITREX) 100 MG tablet [Pharmacy Med Name: SUMATRIPTAN SUCC 100 MG TABLET] 9 tablet 1    Sig: TAKE 1 TABLET BY MOUTH X 1 DOSE NEEDED MIGRAINE. MAY REPEAT IN 2 HRS IF HEADACHE PERSISTS/RECURS.     Neurology:  Migraine Therapy - Triptan Passed - 06/15/2022  6:54 PM      Passed - Last BP in normal range    BP Readings from Last 1 Encounters:  04/08/22 107/75         Passed - Valid encounter within last 12 months    Recent Outpatient Visits           1 month ago No-show for appointment   [facility] Simmons-Robinson, Tawanna Cooler, MD   2 months ago Amenorrhea   [place] [facility] [place], [place], MD   2 months ago Amenorrhea   [place] [facility] [place], [place], MD   4 months ago Frequent urination   [facility] [place], [place], MD   4 months ago Pain of both elbows   [place] [facility] [employer] [place], Tawanna Cooler, MD

## 2022-06-16 NOTE — Telephone Encounter (Signed)
Copied from CRM [id number]. Topic: General - Other >> Jun 16, 2022  4:03 PM Everette C wrote: Reason for CRM: Medication Refill - Medication: hydrOXYzine (ATARAX) 25 MG tablet [[id number]]  Has the patient contacted their pharmacy? Yes.   (Agent: If no, request that the patient contact the pharmacy for the refill. If patient does not wish to contact the pharmacy document the reason why and proceed with request.) (Agent: If yes, when and what did the pharmacy advise?)  Preferred Pharmacy (with phone number or street name): [phone] Dan Humphreys, [place] - [address] [place] [address] Phone: [phone] Fax: [phone] Hours: Not open 24 hours   Has the patient been seen for an appointment in the last year OR does the patient have an upcoming appointment? Yes.    Agent: Please be advised that RX refills may take up to 3 business days. We ask that you follow-up with your pharmacy.

## 2022-06-17 ENCOUNTER — Telehealth: Payer: Self-pay

## 2022-06-17 NOTE — Telephone Encounter (Signed)
LOV 04/08/22 NOV not scheduled LRF 12/29/21 #90 5RF

## 2022-06-17 NOTE — Telephone Encounter (Signed)
Patient already has pending order for rx Will finish request through pending order

## 2022-06-17 NOTE — Telephone Encounter (Signed)
Opened to start rx request for hydroxyzine. Patient already has pending one

## 2022-06-18 MED ORDER — HYDROXYZINE HCL 25 MG PO TABS: 25.0000 mg | ORAL_TABLET | Freq: Four times a day (QID) | ORAL | 0 refills | Status: DC | PRN

## 2022-07-05 ENCOUNTER — Ambulatory Visit (INDEPENDENT_AMBULATORY_CARE_PROVIDER_SITE_OTHER): Payer: Managed Care, Other (non HMO) | Admitting: Family Medicine

## 2022-07-05 ENCOUNTER — Encounter: Payer: Self-pay | Admitting: Family Medicine

## 2022-07-05 DIAGNOSIS — F902 Attention-deficit hyperactivity disorder, combined type: Secondary | ICD-10-CM

## 2022-07-05 MED ORDER — AMPHETAMINE-DEXTROAMPHETAMINE 20 MG PO TABS: 20.0000 mg | ORAL_TABLET | Freq: Two times a day (BID) | ORAL | 0 refills | Status: DC

## 2022-07-05 MED ORDER — HYDROXYZINE HCL 25 MG PO TABS: 25.0000 mg | ORAL_TABLET | Freq: Four times a day (QID) | ORAL | 1 refills | Status: DC | PRN

## 2022-07-05 MED ORDER — AMPHETAMINE-DEXTROAMPHETAMINE 20 MG PO TABS
20.0000 mg | ORAL_TABLET | Freq: Two times a day (BID) | ORAL | 0 refills | Status: DC
Start: 2022-07-05 — End: 2023-01-06

## 2022-07-05 NOTE — Progress Notes (Signed)
I,Vanessa  Vital,acting as a Neurosurgeon for [employer], MD.,have documented all relevant documentation on the behalf of [name], MD,as directed by  [name], MD while in the presence of [name], MD.  Established patient visit   Patient: Krystal Marks   DOB: [birth year]   47 y.o. Female  MRN: [id number] Visit Date: 07/05/2022  Today's healthcare provider: [name], MD   Chief Complaint  Patient presents with   ADD   Medication Refill    Needing refill for hydroxyzine   Subjective     HPI     Medication Refill    Additional comments: Needing refill for hydroxyzine      Last edited by Lubertha Basque, CMA on 07/05/2022  2:56 PM.      Attention Deficit Disorder, follow-up  BP Readings from Last 3 Encounters:  07/05/22 118/79  04/08/22 107/75  03/25/22 122/79   Wt Readings from Last 3 Encounters:  07/05/22 167 lb 8 oz (76 kg)  04/08/22 170 lb 4.8 oz (77.2 kg)  03/25/22 171 lb (77.6 kg)     She was last seen for attention deficit disorder6 months ago.  Management since that visit includes continue adderall 20mg  daily.  She reports excellent compliance with treatment. She is not having side effects.   Symptoms: Yes insomnia No headaches  No abnormal movements No fatigue  No decreased appetite No abdominal pain   No decreased concentration No unable to complete tasks    ---------------------------------------------------------------------------------------------------   Medications: Outpatient Medications Prior to Visit  Medication Sig   lansoprazole (PREVACID) 30 MG capsule TAKE 1 CAPSULE BY MOUTH EVERY DAY IN THE MORNING   SUMAtriptan (IMITREX) 100 MG tablet TAKE 1 TABLET BY MOUTH X 1 DOSE NEEDED MIGRAINE. MAY REPEAT IN 2 HRS IF HEADACHE PERSISTS/RECURS.   [DISCONTINUED] amphetamine-dextroamphetamine (ADDERALL) 20 MG tablet Take 1 tablet (20 mg total) by mouth 2 (two) times daily.    [DISCONTINUED] hydrOXYzine (ATARAX) 25 MG tablet Take 1 tablet (25 mg total) by mouth every 6 (six) hours as needed.   No facility-administered medications prior to visit.    Review of Systems  All other systems reviewed and are negative.      Objective    BP 118/79 (BP Location: Left Arm, Patient Position: Sitting, Cuff Size: Normal)   Pulse 97   Resp 13   Ht 5\' 5"  (1.651 m)   Wt 167 lb 8 oz (76 kg)   SpO2 97%   BMI 27.87 kg/m    Physical Exam Vitals reviewed.  Constitutional:      General: She is not in acute distress.    Appearance: Normal appearance. She is not ill-appearing, toxic-appearing or diaphoretic.  Eyes:     Conjunctiva/sclera: Conjunctivae normal.  Cardiovascular:     Rate and Rhythm: Normal rate and regular rhythm.     Pulses: Normal pulses.     Heart sounds: Normal heart sounds. No murmur heard.    No friction rub. No gallop.  Pulmonary:     Effort: Pulmonary effort is normal. No respiratory distress.     Breath sounds: Normal breath sounds. No stridor. No wheezing, rhonchi or rales.  Abdominal:     General: Bowel sounds are normal. There is no distension.     Palpations: Abdomen is soft.     Tenderness: There is no abdominal tenderness.  Musculoskeletal:     Right lower leg: No edema.     Left lower leg: No edema.  Skin:    Findings:  No erythema or rash.  Neurological:     Mental Status: She is alert and oriented to person, place, and time.       No results found for any visits on 07/05/22.  Assessment & Plan     Problem List Items Addressed This Visit     ADD (attention deficit disorder)    Chronic  Reports doing well with symptom management on current dose, reports feeling well without side effects  Stable on current dose of one tablet twice daily of 20mg  adderall  Refills provided for 3 months  F/u in October 2024         Relevant Medications   amphetamine-dextroamphetamine (ADDERALL) 20 MG tablet     Return in about 3  months (around 10/05/2022) for meds adhd.         The entirety of the information documented in the History of Present Illness, Review of Systems and Physical Exam were personally obtained by me. Portions of this information were initially documented by Lubertha Basque, CMA . I, [name], MD have reviewed the documentation above for thoroughness and accuracy.     [name], MD  [facility] [phone] (phone) [phone] (fax)  [facility]

## 2022-07-05 NOTE — Assessment & Plan Note (Signed)
Chronic  Reports doing well with symptom management on current dose, reports feeling well without side effects  Stable on current dose of one tablet twice daily of 20mg  adderall  Refills provided for 3 months  F/u in October 2024

## 2022-10-05 ENCOUNTER — Ambulatory Visit: Payer: Managed Care, Other (non HMO) | Admitting: Family Medicine

## 2022-10-05 ENCOUNTER — Telehealth: Payer: Self-pay

## 2022-10-05 ENCOUNTER — Encounter: Payer: Self-pay | Admitting: Family Medicine

## 2022-10-05 VITALS — BP 113/67 | HR 80 | Ht 65.0 in | Wt 167.6 lb

## 2022-10-05 DIAGNOSIS — F902 Attention-deficit hyperactivity disorder, combined type: Secondary | ICD-10-CM | POA: Diagnosis not present

## 2022-10-05 DIAGNOSIS — G4709 Other insomnia: Secondary | ICD-10-CM | POA: Diagnosis not present

## 2022-10-05 DIAGNOSIS — F411 Generalized anxiety disorder: Secondary | ICD-10-CM

## 2022-10-05 DIAGNOSIS — Z1231 Encounter for screening mammogram for malignant neoplasm of breast: Secondary | ICD-10-CM

## 2022-10-05 LAB — POCT URINE PREGNANCY: Preg Test, Ur: NEGATIVE

## 2022-10-05 MED ORDER — AMPHETAMINE-DEXTROAMPHETAMINE 20 MG PO TABS
20.0000 mg | ORAL_TABLET | Freq: Two times a day (BID) | ORAL | 0 refills | Status: DC
Start: 2022-10-05 — End: 2023-01-06

## 2022-10-05 MED ORDER — AMPHETAMINE-DEXTROAMPHETAMINE 20 MG PO TABS
20.0000 mg | ORAL_TABLET | Freq: Two times a day (BID) | ORAL | 0 refills | Status: DC
Start: 2022-10-05 — End: 2023-01-29

## 2022-10-05 MED ORDER — AMPHETAMINE-DEXTROAMPHETAMINE 20 MG PO TABS
20.0000 mg | ORAL_TABLET | Freq: Two times a day (BID) | ORAL | 0 refills | Status: DC
Start: 2022-10-05 — End: 2022-10-05

## 2022-10-05 MED ORDER — AMPHETAMINE-DEXTROAMPHETAMINE 20 MG PO TABS: 20.0000 mg | ORAL_TABLET | Freq: Two times a day (BID) | ORAL | 0 refills | Status: DC

## 2022-10-05 MED ORDER — AMPHETAMINE-DEXTROAMPHETAMINE 20 MG PO TABS
20.0000 mg | ORAL_TABLET | Freq: Two times a day (BID) | ORAL | 0 refills | Status: DC
Start: 2022-11-05 — End: 2023-01-06

## 2022-10-05 NOTE — Telephone Encounter (Signed)
Copied from CRM [id number]. Topic: General - Other >> Oct 05, 2022 12:56 PM Jannifer Rodney M wrote: Reason for CRM: Pt reports that she is running late. Pt informed of late policy.
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
## 2022-10-06 ENCOUNTER — Other Ambulatory Visit: Payer: Self-pay | Admitting: Family Medicine

## 2022-10-06 ENCOUNTER — Encounter: Payer: Self-pay | Admitting: Family Medicine

## 2022-10-06 DIAGNOSIS — K219 Gastro-esophageal reflux disease without esophagitis: Secondary | ICD-10-CM

## 2022-10-07 MED ORDER — LANSOPRAZOLE 30 MG PO CPDR
DELAYED_RELEASE_CAPSULE | ORAL | 1 refills | Status: DC
Start: 2022-10-07 — End: 2023-01-06

## 2022-10-07 NOTE — Telephone Encounter (Signed)
Requested Prescriptions  Refused Prescriptions Disp Refills   lansoprazole (PREVACID) 30 MG capsule [Pharmacy Med Name: LANSOPRAZOLE DR 30 MG CAPSULE] 90 capsule 1    Sig: TAKE 1 CAPSULE BY MOUTH EVERY DAY IN THE MORNING     Gastroenterology: Proton Pump Inhibitors 2 Failed - 10/06/2022  7:16 PM      Failed - ALT in normal range and within 360 days    ALT  Date Value Ref Range Status  04/17/2021 15 0 - 32 IU/L Final         Failed - AST in normal range and within 360 days    AST  Date Value Ref Range Status  04/17/2021 17 0 - 40 IU/L Final         Passed - Valid encounter within last 12 months    Recent Outpatient Visits           2 days ago Attention deficit hyperactivity disorder (ADHD), combined type   [place] [facility] Simmons-Robinson, Tawanna Cooler, MD   3 months ago Attention deficit hyperactivity disorder (ADHD), combined type   [place] Simmons-Robinson, [place], MD   5 months ago No-show for appointment   [facility] Simmons-Robinson, [place], MD   6 months ago Amenorrhea   [place] [facility] Simmons-Robinson, [place], MD   6 months ago Amenorrhea   [place] [facility] Simmons-Robinson, Tawanna Cooler, MD       Future Appointments             In 3 weeks Simmons-Robinson, Tawanna Cooler, MD [facility]   In 3 months Simmons-Robinson, [place], MD [facility]             hydrOXYzine (ATARAX) 25 MG tablet [Pharmacy Med Name: HYDROXYZINE HCL 25 MG TABLET] 360 tablet 1    Sig: TAKE 1 TABLET BY MOUTH EVERY 6 HOURS AS NEEDED.     Ear, Nose, and Throat:  Antihistamines 2 Failed - 10/06/2022  7:16 PM      Failed - Cr in normal range and within 360 days    Creatinine, Ser  Date Value Ref Range Status  04/17/2021 0.82 0.57 - 1.00 mg/dL Final         Passed - Valid encounter within last 12 months     Recent Outpatient Visits           2 days ago Attention deficit hyperactivity disorder (ADHD), combined type   [facility] Simmons-Robinson, Tawanna Cooler, MD   3 months ago Attention deficit hyperactivity disorder (ADHD), combined type   [place] [facility] Simmons-Robinson, [place], MD   5 months ago No-show for appointment   [facility] Simmons-Robinson, [place], MD   6 months ago Amenorrhea   [place] [facility] Simmons-Robinson, [place], MD   6 months ago Amenorrhea   [place] [facility] Simmons-Robinson, Tawanna Cooler, MD       Future Appointments             In 3 weeks Simmons-Robinson, Tawanna Cooler, MD [facility]   In 3 months Simmons-Robinson, [place], MD [facility] [place]

## 2022-11-03 ENCOUNTER — Ambulatory Visit (INDEPENDENT_AMBULATORY_CARE_PROVIDER_SITE_OTHER): Payer: Managed Care, Other (non HMO) | Admitting: Family Medicine

## 2022-11-03 ENCOUNTER — Encounter: Payer: Self-pay | Admitting: Family Medicine

## 2022-11-03 VITALS — BP 110/59 | HR 69 | Temp 98.0°F | Ht 66.0 in | Wt 165.2 lb

## 2022-11-03 DIAGNOSIS — F411 Generalized anxiety disorder: Secondary | ICD-10-CM

## 2022-11-03 DIAGNOSIS — Z0001 Encounter for general adult medical examination with abnormal findings: Secondary | ICD-10-CM

## 2022-11-03 DIAGNOSIS — Z6828 Body mass index (BMI) 28.0-28.9, adult: Secondary | ICD-10-CM

## 2022-11-03 DIAGNOSIS — F321 Major depressive disorder, single episode, moderate: Secondary | ICD-10-CM

## 2022-11-03 DIAGNOSIS — Z1211 Encounter for screening for malignant neoplasm of colon: Secondary | ICD-10-CM

## 2022-11-03 DIAGNOSIS — K219 Gastro-esophageal reflux disease without esophagitis: Secondary | ICD-10-CM

## 2022-11-03 DIAGNOSIS — Z1159 Encounter for screening for other viral diseases: Secondary | ICD-10-CM

## 2022-11-03 DIAGNOSIS — E663 Overweight: Secondary | ICD-10-CM | POA: Diagnosis not present

## 2022-11-03 DIAGNOSIS — Z130 Encounter for screening for diseases of the blood and blood-forming organs and certain disorders involving the immune mechanism: Secondary | ICD-10-CM

## 2022-11-03 DIAGNOSIS — Z0000 Encounter for general adult medical examination without abnormal findings: Secondary | ICD-10-CM | POA: Insufficient documentation

## 2022-11-03 DIAGNOSIS — Z131 Encounter for screening for diabetes mellitus: Secondary | ICD-10-CM

## 2022-11-03 DIAGNOSIS — F902 Attention-deficit hyperactivity disorder, combined type: Secondary | ICD-10-CM

## 2022-11-03 NOTE — Progress Notes (Signed)
Complete physical exam   Patient: Krystal Marks   DOB: 02-03-1975   47 y.o. Female  MRN: [id number] Visit Date: 11/03/2022  Today's healthcare provider: [name], MD   Chief Complaint  Patient presents with   Annual Exam   Subjective    Krystal Marks is a 47 y.o. female who presents today for a complete physical exam.   She reports consuming a  [place]avia weight loss   diet.   The patient does not participate in regular exercise at present. She remains active by walking around the yard and doing yard work     She generally feels fairly well.   She reports sleeping well.   She does not have additional problems to discuss today.   Discussed the use of AI scribe software for clinical note transcription with the patient, who gave verbal consent to proceed.  History of Present Illness   The patient, who recently started a weight loss program called [facility], reports a weight loss of approximately eight pounds over the course of two weeks. She notes that her scale at home reads a lower weight than the one at the clinic, but she is still satisfied with the progress. The patient describes the [place] program as involving six meals a day, including snacks, shakes, and one cooked meal. She has been experimenting with different recipes, favoring seafood due to its lean nature.  The patient also reports a decrease in bloating and pain since starting the program. She has not consumed alcohol for the past two weeks as per the program's guidelines. She has been staying active, engaging in activities such as walking and yard work.  In addition to the weight loss program, the patient has been dealing with personal issues. She has been having regular discussions with her spouse, and she is slowly moving back into her shared bedroom. She is scheduled to start therapy in December.  The patient also mentions a decrease in acid reflux symptoms and has not needed to take  omeprazole since starting the diet. She also notes an improvement in her allergies. 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  The patient has not yet scheduled her colonoscopy and mammogram, but plans to do so before the end of the year. She has not received a flu vaccine and does not plan to get a COVID-19 vaccine.         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   Benzodiazepine abuse (HCC) 05/25/2017   Benzodiazepine overdose 05/25/2017   Cigarette smoker 08/16/2014   Depression    Depression, major, single episode, moderate (HCC) 08/16/2014   Fatigue 01/20/2017   Fibromyalgia    Gastroduodenal ulcer 08/16/2014   GERD (gastroesophageal reflux disease)    IBS (irritable bowel syndrome)    Irregular bleeding 08/16/2014   Migraine 08/16/2014   Migraine headache    monthly   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   Past Surgical History:  Procedure Laterality Date   BREAST ENHANCEMENT SURGERY  2007   COLONOSCOPY     ESOPHAGOGASTRODUODENOSCOPY     x4   KNEE ARTHROSCOPY WITH PATELLA RECONSTRUCTION Left 07/19/2017   Procedure: KNEE ARTHROSCOPY PARTIAL MEDIAL MENISECTOMY PATELLA CHONDROPLASTY;  Surgeon: Signa Kell, MD;  Location: [facility];  Service:  Orthopedics;  Laterality: Left;   SHOULDER ARTHROSCOPY Right    Social History   Socioeconomic History   Marital status: Married    Spouse name: Not on file   Number of children: Not on file   Years of education: Not on file   Highest education level: Associate degree: occupational, Scientist, product/process development, or vocational program  Occupational History   Not on file  Tobacco Use   Smoking status: Every Day    Current packs/day: 0.00    Average packs/day: 0.5 packs/day for 20.0 years (10.0 ttl  pk-yrs)    Types: Cigarettes    Start date: 06/03/1997    Last attempt to quit: 06/03/2017    Years since quitting: 5.4   Smokeless tobacco: Never   Tobacco comments:       Vaping Use   Vaping status: Never Used  Substance and Sexual Activity   Alcohol use: Yes    Alcohol/week: 4.0 standard drinks of alcohol    Types: 4 Glasses of wine per week   Drug use: No   Sexual activity: Not on file  Other Topics Concern   Not on file  Social History Narrative   Not on file   Social Determinants of Health   Financial Resource Strain: Low Risk  (11/02/2022)   Overall Financial Resource Strain (CARDIA)    Difficulty of Paying Living Expenses: Not hard at all  Food Insecurity: No Food Insecurity (11/02/2022)   Hunger Vital Sign    Worried About Running Out of Food in the Last Year: Never true    Ran Out of Food in the Last Year: Never true  Transportation Needs: No Transportation Needs (11/02/2022)   PRAPARE - Administrator, Civil Service (Medical): No    Lack of Transportation (Non-Medical): No  Physical Activity: Insufficiently Active (11/02/2022)   Exercise Vital Sign    Days of Exercise per Week: 2 days    Minutes of Exercise per Session: 30 min  Stress: No Stress Concern Present (11/02/2022)   [employer] of Occupational Health - Occupational Stress Questionnaire    Feeling of Stress : Not at all  Social Connections: Socially Integrated (11/02/2022)   Social Connection and Isolation Panel [NHANES]    Frequency of Communication with Friends and Family: More than three times a week    Frequency of Social Gatherings with Friends and Family: Once a week    Attends Religious Services: More than 4 times per year    Active Member of [employer] or Organizations: Yes    Attends Banker Meetings: 1 to 4 times per year    Marital Status: Married  Catering manager Violence: Not on file   Family Status  Relation Name Status   Mother  Alive   Father  Alive    Brother  Alive   MGM  Alive   MGF  Deceased   Neg Hx  (Not Specified)  No partnership data on file   Family History  Problem Relation Age of Onset   Healthy Mother    Healthy Father    Healthy Brother    Osteoporosis Maternal Grandmother    Esophageal cancer Maternal Grandfather    Colon cancer Neg Hx    Breast cancer Neg Hx    Allergies  Allergen Reactions   Duloxetine Nausea And Vomiting    CAN NOT TAKE WITH IMITREX    Iodinated Contrast Media Itching    Omnpaque i 300, 10 min delayed reaction of large hives, itching & urticaria//  will require 13hr prep for future iv contrast,//a.calhoun   Meperidine     Other reaction(s): Respiratory Distress   Dilaudid [Hydromorphone]     Hard times breathing     Medications: Outpatient Medications Prior to Visit  Medication Sig   [START ON 12/03/2022] amphetamine-dextroamphetamine (ADDERALL) 20 MG tablet Take 1 tablet (20 mg total) by mouth 2 (two) times daily.   hydrOXYzine (ATARAX) 25 MG tablet Take 1 tablet (25 mg total) by mouth every 6 (six) hours as needed.   lansoprazole (PREVACID) 30 MG capsule TAKE 1 CAPSULE BY MOUTH EVERY DAY IN THE MORNING   SUMAtriptan (IMITREX) 100 MG tablet TAKE 1 TABLET BY MOUTH X 1 DOSE NEEDED MIGRAINE. MAY REPEAT IN 2 HRS IF HEADACHE PERSISTS/RECURS.   amphetamine-dextroamphetamine (ADDERALL) 20 MG tablet Take 1 tablet (20 mg total) by mouth 2 (two) times daily.   amphetamine-dextroamphetamine (ADDERALL) 20 MG tablet Take 1 tablet (20 mg total) by mouth 2 (two) times daily. (Patient not taking: Reported on 11/03/2022)   amphetamine-dextroamphetamine (ADDERALL) 20 MG tablet Take 1 tablet (20 mg total) by mouth 2 (two) times daily. (Patient not taking: Reported on 11/03/2022)   [START ON 11/05/2022] amphetamine-dextroamphetamine (ADDERALL) 20 MG tablet Take 1 tablet (20 mg total) by mouth 2 (two) times daily. (Patient not taking: Reported on 11/03/2022)   No facility-administered medications prior to visit.     Review of Systems  Last CBC Lab Results  Component Value Date   WBC 4.4 08/06/2021   HGB 14.3 08/06/2021   HCT 41.4 08/06/2021   MCV 89 08/06/2021   MCH 30.8 08/06/2021   RDW 11.5 (L) 08/06/2021   PLT 261 08/06/2021   Last metabolic panel Lab Results  Component Value Date   GLUCOSE 96 04/17/2021   NA 139 04/17/2021   K 4.4 04/17/2021   CL 107 (H) 04/17/2021   CO2 21 04/17/2021   BUN 13 04/17/2021   CREATININE 0.82 04/17/2021   EGFR 90 04/17/2021   CALCIUM 8.7 04/17/2021   PROT 6.6 04/17/2021   ALBUMIN 4.1 04/17/2021   LABGLOB 2.5 04/17/2021   AGRATIO 1.6 04/17/2021   BILITOT 1.4 (H) 04/17/2021   ALKPHOS 63 04/17/2021   AST 17 04/17/2021   ALT 15 04/17/2021   ANIONGAP 10 09/20/2018   Last lipids Lab Results  Component Value Date   CHOL 162 04/17/2021   HDL 57 04/17/2021   LDLCALC 94 04/17/2021   TRIG 52 04/17/2021   CHOLHDL 2.8 04/17/2021   Last hemoglobin A1c Lab Results  Component Value Date   HGBA1C 5.4 08/06/2021   Last thyroid functions Lab Results  Component Value Date   TSH 2.650 04/08/2022       Objective    BP (!) 110/59 (BP Location: Left Arm, Patient Position: Sitting, Cuff Size: Normal)   Pulse 69   Temp 98 F (36.7 C)   Ht 5\' 6"  (1.676 m)   Wt 165 lb 3.2 oz (74.9 kg)   SpO2 97%   BMI 26.66 kg/m  BP Readings from Last 3 Encounters:  11/03/22 (!) 110/59  10/05/22 113/67  07/05/22 118/79   Wt Readings from Last 3 Encounters:  11/03/22 165 lb 3.2 oz (74.9 kg)  10/05/22 167 lb 9.6 oz (76 kg)  07/05/22 167 lb 8 oz (76 kg)        Physical Exam Vitals reviewed.  Constitutional:      General: She is not in acute distress.    Appearance: Normal appearance. She is not ill-appearing, toxic-appearing or  diaphoretic.  HENT:     Head: Normocephalic and atraumatic.     Right Ear: Tympanic membrane and external ear normal. There is no impacted cerumen.     Left Ear: Tympanic membrane and external ear normal. There is no  impacted cerumen.     Nose: Nose normal.     Mouth/Throat:     Pharynx: Oropharynx is clear.  Eyes:     General: No scleral icterus.    Extraocular Movements: Extraocular movements intact.     Conjunctiva/sclera: Conjunctivae normal.     Pupils: Pupils are equal, round, and reactive to light.  Cardiovascular:     Rate and Rhythm: Normal rate and regular rhythm.     Pulses: Normal pulses.     Heart sounds: Normal heart sounds. No murmur heard.    No friction rub. No gallop.  Pulmonary:     Effort: Pulmonary effort is normal. No respiratory distress.     Breath sounds: Normal breath sounds. No wheezing, rhonchi or rales.  Abdominal:     General: Bowel sounds are normal. There is no distension.     Palpations: Abdomen is soft. There is no mass.     Tenderness: There is no abdominal tenderness. There is no guarding.  Musculoskeletal:        General: No deformity.     Cervical back: Normal range of motion and neck supple. No rigidity.     Right lower leg: No edema.     Left lower leg: No edema.  Lymphadenopathy:     Cervical: No cervical adenopathy.  Skin:    General: Skin is warm.     Capillary Refill: Capillary refill takes less than 2 seconds.     Findings: No erythema or rash.  Neurological:     General: No focal deficit present.     Mental Status: She is alert and oriented to person, place, and time.     Motor: No weakness.     Gait: Gait normal.  Psychiatric:        Mood and Affect: Mood normal.        Behavior: Behavior normal.       Last depression screening scores    11/03/2022    1:06 PM 10/05/2022    1:21 PM 04/08/2022   10:10 AM  PHQ 2/9 Scores  PHQ - 2 Score 0 0 0  PHQ- 9 Score 0 0 0    Last fall risk screening    04/08/2022   10:10 AM  Fall Risk   Falls in the past year? 0  Number falls in past yr: 0  Injury with Fall? 0  Risk for fall due to : No Fall Risks  Follow up Falls evaluation completed    Last Audit-C alcohol use screening     11/02/2022    1:50 PM  Alcohol Use Disorder Test (AUDIT)  1. How often do you have a drink containing alcohol? 2  2. How many drinks containing alcohol do you have on a typical day when you are drinking? 0  3. How often do you have six or more drinks on one occasion? 0  AUDIT-C Score 2   A score of 3 or more in women, and 4 or more in men indicates increased risk for alcohol abuse, EXCEPT if all of the points are from question 1   No results found for any visits on 11/03/22.  Assessment & Plan    Routine Health Maintenance and Physical Exam  Immunization History  Administered Date(s) Administered   Hepatitis B 07/05/2014, 08/05/2014, 10/05/2014   Hepatitis B, ADULT 08/21/2014, 10/24/2014   Influenza,inj,Quad PF,6+ Mos 01/27/2016, 01/20/2017, 09/21/2017   Tdap 07/16/2014    Health Maintenance  Topic Date Due   Hepatitis C Screening  Never done   Colonoscopy  Never done   MAMMOGRAM  03/03/2022   COVID-19 Vaccine (1) 11/19/2022 (Originally 05/26/1980)   INFLUENZA VACCINE  04/04/2023 (Originally 08/05/2022)   DTaP/Tdap/Td (2 - Td or Tdap) 07/15/2024   Cervical Cancer Screening (HPV/Pap Cotest)  02/17/2027   HIV Screening  Completed   HPV VACCINES  Aged Out    Problem List Items Addressed This Visit       Digestive   Acid reflux     Other   Overweight with body mass index (BMI) of 28 to 28.9 in adult   Relevant Orders   Hemoglobin A1c   CMP14+EGFR   Lipid panel   Depression, major, single episode, moderate (HCC)   Anxiety, generalized   Annual physical exam - Primary    Chronic conditions are stable  Patient was counseled on benefits of regular physical activity with goal of 150 minutes of moderate to vigurous intensity 4 days per week  Patient was counseled to consume well balanced diet of fruits, vegetables, limited saturated fats and limited sugary foods and beverages with emphasis on consuming water goals based on her dietary plan   Screening recommended today: A1c,  lipids,Hep C,CBC, CMP  Vaccines recommended today: COVID,influenza (declines both)  Recommended calling to schedule colonoscopy, referral previoulsy placed        ADD (attention deficit disorder)   Relevant Orders   CMP14+EGFR   Other Visit Diagnoses     Screening for diabetes mellitus       Relevant Orders   Hemoglobin A1c   Screening for colon cancer       Encounter for hepatitis C screening test for low risk patient       Relevant Orders   Hepatitis C antibody   Screening for deficiency anemia       Relevant Orders   CBC       Assessment and Plan    Weight Management Patient has started the [place] diet and reports weight loss, decreased bloating, and decreased pain. She is currently not exercising as per the diet's initial recommendations but remains active. -Continue [place] diet and monitor weight loss progress. -Plan to incorporate exercise after the initial two weeks.  Gastroesophageal Reflux Disease (GERD) Patient reports not needing to take omeprazole since starting the diet, suggesting improved GERD symptoms. -Continue current diet and monitor GERD symptoms.  General Health Maintenance -Order labs today including diabetes screening (A1C), CBC, CMP, cholesterol, and Hepatitis C screening. -Patient declined flu and COVID vaccines. -Encourage patient to schedule colonoscopy and mammogram before the end of the year.  Follow-up in January 2025 to monitor progress and address any new concerns.           Return in about 1 year (around 11/03/2023) for CPE.       [name], MD  [facility] [phone] (phone) [phone] (fax)  [facility]

## 2022-11-03 NOTE — Assessment & Plan Note (Addendum)
Chronic conditions are stable  Patient was counseled on benefits of regular physical activity with goal of 150 minutes of moderate to vigurous intensity 4 days per week  Patient was counseled to consume well balanced diet of fruits, vegetables, limited saturated fats and limited sugary foods and beverages with emphasis on consuming water goals based on her dietary plan   Screening recommended today: A1c, lipids,Hep C,CBC, CMP  Vaccines recommended today: COVID,influenza (declines both)  Recommended calling to schedule colonoscopy, referral previoulsy placed

## 2022-11-03 NOTE — Patient Instructions (Signed)
It was a pleasure to see you today!  Thank you for choosing [facility] for your primary care.   Today you were seen for your annual physical  Please review the attached information regarding helpful preventive health topics.   To keep you healthy, please keep in mind the following health maintenance items that you are due for:   1.Contact [facility] to schedule your colon cancer screening  [phone]  2. COVID vaccine  3. Influenza vaccine   Please make sure to schedule your next annual physical for one year from today.   Best Wishes,   Dr. Roxan Hockey

## 2022-11-04 LAB — LIPID PANEL
Chol/HDL Ratio: 2.6 ratio (ref 0.0–4.4)
Cholesterol, Total: 145 mg/dL (ref 100–199)
HDL: 55 mg/dL (ref >39)
LDL Chol Calc (NIH): 79 mg/dL (ref 0–99)
Triglycerides: 49 mg/dL (ref 0–149)
VLDL Cholesterol Cal: 11 mg/dL (ref 5–40)

## 2022-11-04 LAB — CMP14+EGFR
ALT: 16 [IU]/L (ref 0–32)
AST: 19 [IU]/L (ref 0–40)
Albumin: 4.3 g/dL (ref 3.9–4.9)
Alkaline Phosphatase: 71 [IU]/L (ref 44–121)
BUN/Creatinine Ratio: 20 (ref 9–23)
BUN: 15 mg/dL (ref 6–24)
Bilirubin Total: 0.7 mg/dL (ref 0.0–1.2)
CO2: 22 mmol/L (ref 20–29)
Calcium: 9.5 mg/dL (ref 8.7–10.2)
Chloride: 103 mmol/L (ref 96–106)
Creatinine, Ser: 0.75 mg/dL (ref 0.57–1.00)
Globulin, Total: 2.6 g/dL (ref 1.5–4.5)
Glucose: 95 mg/dL (ref 70–99)
Potassium: 5.1 mmol/L (ref 3.5–5.2)
Sodium: 139 mmol/L (ref 134–144)
Total Protein: 6.9 g/dL (ref 6.0–8.5)
eGFR: 99 mL/min/{1.73_m2} (ref >59)

## 2022-11-04 LAB — CBC
Hematocrit: 39.6 % (ref 34.0–46.6)
Hemoglobin: 13.4 g/dL (ref 11.1–15.9)
MCH: 31.6 pg (ref 26.6–33.0)
MCHC: 33.8 g/dL (ref 31.5–35.7)
MCV: 93 fL (ref 79–97)
Platelets: 298 10*3/uL (ref 150–450)
RBC: 4.24 x10E6/uL (ref 3.77–5.28)
RDW: 11.3 % — ABNORMAL LOW (ref 11.7–15.4)
WBC: 7.9 10*3/uL (ref 3.4–10.8)

## 2022-11-04 LAB — HEPATITIS C ANTIBODY: Hep C Virus Ab: NONREACTIVE

## 2022-11-04 LAB — HEMOGLOBIN A1C
Est. average glucose Bld gHb Est-mCnc: 108 mg/dL
Hgb A1c MFr Bld: 5.4 % (ref 4.8–5.6)

## 2022-12-01 ENCOUNTER — Ambulatory Visit
Admission: RE | Admit: 2022-12-01 | Discharge: 2022-12-01 | Disposition: A | Discharge status: Home / Self Care | Payer: Managed Care, Other (non HMO) | Source: Ambulatory Visit | Attending: Family Medicine | Admitting: Family Medicine

## 2022-12-01 DIAGNOSIS — Z1231 Encounter for screening mammogram for malignant neoplasm of breast: Secondary | ICD-10-CM | POA: Diagnosis present

## 2022-12-09 ENCOUNTER — Encounter: Payer: Self-pay | Admitting: *Deleted

## 2022-12-31 ENCOUNTER — Other Ambulatory Visit: Payer: Self-pay | Admitting: Family Medicine

## 2023-01-04 NOTE — Telephone Encounter (Signed)
 Requested Prescriptions  Pending Prescriptions Disp Refills   hydrOXYzine  (ATARAX ) 25 MG tablet [Pharmacy Med Name: HYDROXYZINE  HCL 25 MG TABLET] 360 tablet 1    Sig: TAKE 1 TABLET BY MOUTH EVERY 6 HOURS AS NEEDED.     Ear, Nose, and Throat:  Antihistamines 2 Passed - 01/04/2023  3:53 PM      Passed - Cr in normal range and within 360 days    Creatinine, Ser  Date Value Ref Range Status  11/03/2022 0.75 0.57 - 1.00 mg/dL Final         Passed - Valid encounter within last 12 months    Recent Outpatient Visits           2 months ago Annual physical exam   [facility] Simmons-Robinson, [place], MD   3 months ago Attention deficit hyperactivity disorder (ADHD), combined type   [place] Simmons-Robinson, [place], MD   6 months ago Attention deficit hyperactivity disorder (ADHD), combined type   [place] [facility] Simmons-Robinson, [place], MD   8 months ago No-show for appointment   [facility] Simmons-Robinson, [place], MD   9 months ago Amenorrhea   [place] [facility] Simmons-Robinson, [place], MD       Future Appointments             In 2 days Simmons-Robinson, [place], MD [facility]   In 10 months Simmons-Robinson, [place], MD [facility], [place]

## 2023-01-06 ENCOUNTER — Encounter: Payer: Self-pay | Admitting: Family Medicine

## 2023-01-06 ENCOUNTER — Ambulatory Visit (INDEPENDENT_AMBULATORY_CARE_PROVIDER_SITE_OTHER): Payer: Managed Care, Other (non HMO) | Admitting: Family Medicine

## 2023-01-06 VITALS — BP 115/75 | HR 75 | Resp 16 | Ht 66.0 in | Wt 155.0 lb

## 2023-01-06 DIAGNOSIS — F411 Generalized anxiety disorder: Secondary | ICD-10-CM

## 2023-01-06 DIAGNOSIS — F900 Attention-deficit hyperactivity disorder, predominantly inattentive type: Secondary | ICD-10-CM

## 2023-01-06 DIAGNOSIS — E663 Overweight: Secondary | ICD-10-CM

## 2023-01-06 DIAGNOSIS — K219 Gastro-esophageal reflux disease without esophagitis: Secondary | ICD-10-CM | POA: Diagnosis not present

## 2023-01-06 DIAGNOSIS — Z6828 Body mass index (BMI) 28.0-28.9, adult: Secondary | ICD-10-CM

## 2023-01-06 NOTE — Assessment & Plan Note (Signed)
 Chronic  Symptoms controlled on adderall  Continue adderall 20mg  BID  Refills due 01/30/23 Will continue 90 day supply refills when due

## 2023-01-06 NOTE — Assessment & Plan Note (Signed)
 Chronic  Improved with dietary changes  BMI down to 25.02 Reports weight loss of 18 pounds since starting the [place] program, current weight 151 pounds. Increased energy levels, last remembered having this much energy in 2007. No feelings of deprivation or hunger. BMI now in a normal range. Reports improved clarity and overall well-being. - Continue [place] program - Monitor weight and energy levels

## 2023-01-06 NOTE — Assessment & Plan Note (Signed)
 Reports not needing to take Lansoprazole 30mg  since starting the [place] program and eating healthier. - Continue current dietary regimen - Monitor for recurrence of GERD symptoms

## 2023-01-06 NOTE — Progress Notes (Signed)
 Established patient visit   Patient: Krystal Marks   DOB: 05/18/1975   48 y.o. Female  MRN: [id number] Visit Date: 01/06/2023  Today's healthcare provider: Rockie Agent, MD   Chief Complaint  Patient presents with   ADHD        Subjective     HPI     ADHD    Additional comments:        Last edited by Anette Alan CROME, CMA on 01/06/2023  1:04 PM.       Discussed the use of AI scribe software for clinical note transcription with the patient, who gave verbal consent to proceed.  History of Present Illness   The patient, who has been on the [facility] weight loss program, reports a significant weight loss of approximately 18 pounds. She describes an increase in energy levels, the highest since 2007, and an overall improvement in health. She has not needed to take Lansoprazole  since starting the program and reports a decrease in back pain.  The patient also mentions a significant improvement in mental clarity. She has been able to engage in more physical activities, such as moving furniture and deep cleaning her home. She also reports a decrease in knee pain when climbing stairs.  The patient has also started taking a creatine supplement, which she reports has further improved her energy levels and overall well-being. She has also reduced her intake of hydroxyzine  from three to two at night.  In addition to her weight loss program, the patient has also been dealing with issues related to her two dogs. One of the dogs has been showing signs of resource guarding and has been scheduled for a three-week training program.  The patient also mentions that she has started counseling to help navigate her relationship with her husband, who suffers from depression. She reports that her relationship has improved recently.  Regarding her ADHD medication, the patient confirms that she is still taking 20mg  of Adderall twice a day and that she is doing well on this regimen. She  also mentions that she has been able to manage her medication refills effectively.  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 She expresses optimism about her business prospects for the coming year.         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   Benzodiazepine abuse (HCC) 05/25/2017   Benzodiazepine overdose 05/25/2017   Cigarette smoker 08/16/2014   Depression    Depression, major, single episode, moderate (HCC) 08/16/2014   Fatigue 01/20/2017   Fibromyalgia    Gastroduodenal ulcer 08/16/2014   GERD (gastroesophageal reflux disease)    IBS (irritable bowel syndrome)    Irregular bleeding 08/16/2014   Migraine 08/16/2014   Migraine headache    monthly   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    Medications: Outpatient Medications Prior to Visit  Medication Sig   amphetamine -dextroamphetamine  (ADDERALL) 20 MG tablet Take 1 tablet (20 mg total) by mouth 2 (two) times daily.   hydrOXYzine  (ATARAX ) 25 MG tablet TAKE 1 TABLET BY MOUTH EVERY 6 HOURS AS NEEDED.   SUMAtriptan  (IMITREX ) 100 MG tablet TAKE 1 TABLET BY MOUTH X 1 DOSE NEEDED MIGRAINE. MAY REPEAT IN 2 HRS IF HEADACHE  PERSISTS/RECURS.   amphetamine -dextroamphetamine  (ADDERALL) 20 MG tablet Take 1 tablet (20 mg total) by mouth 2 (two) times daily. (Patient not taking: Reported on 11/03/2022)   [DISCONTINUED] amphetamine -dextroamphetamine  (ADDERALL) 20 MG tablet Take 1 tablet (20 mg total) by mouth 2 (two) times daily.   [DISCONTINUED] amphetamine -dextroamphetamine  (ADDERALL) 20 MG tablet Take 1 tablet (20 mg total) by mouth 2 (two) times daily. (Patient not taking: Reported on 11/03/2022)   [DISCONTINUED] amphetamine -dextroamphetamine  (ADDERALL) 20 MG  tablet Take 1 tablet (20 mg total) by mouth 2 (two) times daily. (Patient not taking: Reported on 11/03/2022)   [DISCONTINUED] lansoprazole  (PREVACID ) 30 MG capsule TAKE 1 CAPSULE BY MOUTH EVERY DAY IN THE MORNING   No facility-administered medications prior to visit.    Review of Systems  Last CBC Lab Results  Component Value Date   WBC 7.9 11/03/2022   HGB 13.4 11/03/2022   HCT 39.6 11/03/2022   MCV 93 11/03/2022   MCH 31.6 11/03/2022   RDW 11.3 (L) 11/03/2022   PLT 298 11/03/2022   Last metabolic panel Lab Results  Component Value Date   GLUCOSE 95 11/03/2022   NA 139 11/03/2022   K 5.1 11/03/2022   CL 103 11/03/2022   CO2 22 11/03/2022   BUN 15 11/03/2022   CREATININE 0.75 11/03/2022   EGFR 99 11/03/2022   CALCIUM 9.5 11/03/2022   PROT 6.9 11/03/2022   ALBUMIN 4.3 11/03/2022   LABGLOB 2.6 11/03/2022   AGRATIO 1.6 04/17/2021   BILITOT 0.7 11/03/2022   ALKPHOS 71 11/03/2022   AST 19 11/03/2022   ALT 16 11/03/2022   ANIONGAP 10 09/20/2018   Last lipids Lab Results  Component Value Date   CHOL 145 11/03/2022   HDL 55 11/03/2022   LDLCALC 79 11/03/2022   TRIG 49 11/03/2022   CHOLHDL 2.6 11/03/2022        Objective    BP 115/75   Pulse 75   Resp 16   Ht 5' 6 (1.676 m)   Wt 155 lb (70.3 kg)   SpO2 97%   BMI 25.02 kg/m  BP Readings from Last 3 Encounters:  01/06/23 115/75  11/03/22 (!) 110/59  10/05/22 113/67   Wt Readings from Last 3 Encounters:  01/06/23 155 lb (70.3 kg)  11/03/22 165 lb 3.2 oz (74.9 kg)  10/05/22 167 lb 9.6 oz (76 kg)        Physical Exam  General: Alert, no acute distress Cardio: Normal S1 and S2, RRR, no r/m/g Pulm: CTAB, normal work of breathing Abdomen: Bowel sounds normal. Abdomen soft and non-tender.  Extremities: No peripheral edema.    No results found for any visits on 01/06/23.  Assessment & Plan     Problem List Items Addressed This Visit       Digestive   Acid reflux   Reports not needing to take  Lansoprazole  30mg  since starting the [facility] program and eating healthier. - Continue current dietary regimen - Monitor for recurrence of GERD symptoms        Other   Overweight with body mass index (BMI) of 28 to 28.9 in adult   Chronic  Improved with dietary changes  BMI down to 25.02 Reports weight loss of 18 pounds since starting the [facility] program, current weight 151 pounds. Increased energy levels, last remembered having this much energy in 2007. No feelings of deprivation or hunger. BMI now in a normal range. Reports improved clarity and overall well-being. - Continue [facility] program - Monitor weight and energy levels  Attention deficit hyperactivity disorder, predominantly inattentive type - Primary   Chronic  Symptoms controlled on adderall  Continue adderall 20mg  BID  Refills due 01/30/23 Will continue 90 day supply refills when due        Anxiety, generalized      Chronic Back Pain Reports reduction in back pain since starting the [facility] program, though still experiencing some pain. - Continue current exercise and dietary regimen - Monitor back pain  Insomnia Reports reducing hydroxyzine  intake from three to two tablets at night due to improved sleep quality from the [facility] program. - Continue current hydroxyzine  dosage - Monitor sleep quality   General Health Maintenance Engaging in regular physical activity, including lifting and deep cleaning at home. Plans to join a gym. Taking creatine supplements for energy, hair, nails, and muscle health. - Encourage continued physical activity - Monitor overall health and wellness - Schedule physical exam in October    Return in about 5 months (around 06/06/2023) for ADHD.         Rockie Agent, MD  [facility] [phone] (phone) [phone] (fax)  [facility]

## 2023-01-29 ENCOUNTER — Other Ambulatory Visit: Payer: Self-pay | Admitting: Family Medicine

## 2023-01-29 DIAGNOSIS — F902 Attention-deficit hyperactivity disorder, combined type: Secondary | ICD-10-CM

## 2023-02-01 MED ORDER — AMPHETAMINE-DEXTROAMPHETAMINE 20 MG PO TABS: 20.0000 mg | ORAL_TABLET | Freq: Two times a day (BID) | ORAL | 0 refills | Status: DC

## 2023-02-01 MED ORDER — AMPHETAMINE-DEXTROAMPHETAMINE 20 MG PO TABS
20.0000 mg | ORAL_TABLET | Freq: Two times a day (BID) | ORAL | 0 refills | Status: DC
Start: 2023-02-01 — End: 2023-06-09

## 2023-03-14 ENCOUNTER — Encounter: Payer: Self-pay | Admitting: Family Medicine

## 2023-03-14 DIAGNOSIS — F902 Attention-deficit hyperactivity disorder, combined type: Secondary | ICD-10-CM

## 2023-03-17 MED ORDER — AMPHETAMINE-DEXTROAMPHETAMINE 20 MG PO TABS
20.0000 mg | ORAL_TABLET | Freq: Two times a day (BID) | ORAL | 0 refills | Status: DC
Start: 2023-03-17 — End: 2023-06-09

## 2023-03-17 NOTE — Telephone Encounter (Signed)
 Called and spoke to Sarah at [facility] in [place], she verbally stated they have adderall 20mg  in stock to cover 60 tablet.

## 2023-06-09 ENCOUNTER — Ambulatory Visit (INDEPENDENT_AMBULATORY_CARE_PROVIDER_SITE_OTHER): Payer: Self-pay | Admitting: Family Medicine

## 2023-06-09 ENCOUNTER — Encounter: Payer: Self-pay | Admitting: Family Medicine

## 2023-06-09 VITALS — BP 98/58 | HR 66 | Ht 66.0 in | Wt 153.0 lb

## 2023-06-09 DIAGNOSIS — Z1231 Encounter for screening mammogram for malignant neoplasm of breast: Secondary | ICD-10-CM

## 2023-06-09 DIAGNOSIS — R350 Frequency of micturition: Secondary | ICD-10-CM | POA: Diagnosis not present

## 2023-06-09 DIAGNOSIS — F902 Attention-deficit hyperactivity disorder, combined type: Secondary | ICD-10-CM | POA: Diagnosis not present

## 2023-06-09 DIAGNOSIS — R300 Dysuria: Secondary | ICD-10-CM | POA: Diagnosis not present

## 2023-06-09 DIAGNOSIS — Z9109 Other allergy status, other than to drugs and biological substances: Secondary | ICD-10-CM

## 2023-06-09 DIAGNOSIS — Z1211 Encounter for screening for malignant neoplasm of colon: Secondary | ICD-10-CM

## 2023-06-09 LAB — POCT URINALYSIS DIPSTICK
Glucose, UA: NEGATIVE
Ketones, UA: NEGATIVE
Leukocytes, UA: NEGATIVE
Nitrite, UA: NEGATIVE
Protein, UA: NEGATIVE
Spec Grav, UA: <=1.005 — AB (ref 1.010–1.025)
Urobilinogen, UA: 2 U/dL — AB
pH, UA: 6 (ref 5.0–8.0)

## 2023-06-09 MED ORDER — CEPHALEXIN 500 MG PO CAPS: 500.0000 mg | ORAL_CAPSULE | Freq: Three times a day (TID) | ORAL | 0 refills | Status: AC

## 2023-06-09 MED ORDER — AMPHETAMINE-DEXTROAMPHETAMINE 20 MG PO TABS: 20.0000 mg | ORAL_TABLET | Freq: Two times a day (BID) | ORAL | 0 refills | Status: DC

## 2023-06-09 MED ORDER — LISDEXAMFETAMINE DIMESYLATE 30 MG PO CAPS: 30.0000 mg | ORAL_CAPSULE | Freq: Every day | ORAL | 0 refills | Status: DC

## 2023-06-09 NOTE — Assessment & Plan Note (Signed)
 Chronic allergies with symptoms including postnasal drip and eye fatigue. Previously discontinued allergy shots due to travel. Interested in resuming to reduce reliance on medications. Referral to [facility] for evaluation and possible resumption of allergy shots. - Refer to [facility] for allergy evaluation and possible resumption of allergy shots

## 2023-06-09 NOTE — Progress Notes (Signed)
 Established patient visit   Patient: Krystal Marks   DOB: April 19, 1975   48 y.o. Female  MRN: [id number] Visit Date: 06/09/2023  Today's healthcare provider: Mimi Alt, MD   Chief Complaint  Patient presents with   ADHD    Things are going well no concerns    Urinary Tract Infection    Urine frequency    Subjective     HPI     ADHD    Additional comments: Things are going well no concerns         Urinary Tract Infection    Additional comments: Urine frequency       Last edited by Bart Lieu, CMA on 06/09/2023  4:08 PM.       Discussed the use of AI scribe software for clinical note transcription with the patient, who gave verbal consent to proceed.  History of Present Illness Krystal Marks is a 48 year old female who presents for follow-up of chronic and stable ADHD.  She continues to take Adderall 20 mg twice daily. She is considering a switch to an extended-release formulation due to the current medication's 'jolt' effect.  She experiences urgency and a sensation of needing to urinate immediately, particularly after sexual intercourse, with a sensation in the urethra area. Her last urine culture was in February of the previous year, and she has not had frequent infections.  She has a history of allergies causing significant eye fatigue, requiring multiple allergy medications, including a nasal spray, a postnasal drip medication, and Xyzal eye drops. She wants to return to allergy shots, which she had previously started but discontinued due to a restrictive schedule.  She has experienced significant weight loss, losing a total of 22 pounds, and has increased her physical activity, including hiking and working in her yard. She mentions a history of increased energy and activity levels, partly due to her [place] shepherd, which she takes on walks and hikes.  Her social history includes a reduction in alcohol consumption, having  switched from drinking wine nightly to occasionally consuming vodka, with a maximum of seven drinks per week. She has also been involved in a weight loss program, which her mother has joined as well.  She recalls a childhood condition that required the application of a steroid cream in the genital area, possibly related to a congenital issue, but she does not experience frequent infections now.     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   Benzodiazepine abuse (HCC) 05/25/2017   Benzodiazepine overdose 05/25/2017   Cigarette smoker 08/16/2014   Depression    Depression, major, single episode, moderate (HCC) 08/16/2014   Fatigue 01/20/2017   Fibromyalgia    Gastroduodenal ulcer 08/16/2014   GERD (gastroesophageal reflux disease)    IBS (irritable bowel syndrome)    Irregular bleeding 08/16/2014   Migraine 08/16/2014   Migraine headache    monthly   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    Medications: Outpatient Medications Prior to Visit  Medication Sig   hydrOXYzine  (ATARAX ) 25 MG tablet TAKE 1 TABLET BY MOUTH EVERY 6 HOURS AS NEEDED.   SUMAtriptan  (IMITREX ) 100 MG tablet TAKE 1 TABLET BY MOUTH X 1 DOSE NEEDED MIGRAINE. MAY REPEAT IN 2 HRS IF HEADACHE PERSISTS/RECURS.   [DISCONTINUED] amphetamine -dextroamphetamine  (ADDERALL) 20 MG  tablet Take 1 tablet (20 mg total) by mouth 2 (two) times daily.   [DISCONTINUED] amphetamine -dextroamphetamine  (ADDERALL) 20 MG tablet Take 1 tablet (20 mg total) by mouth 2 (two) times daily.   [DISCONTINUED] amphetamine -dextroamphetamine  (ADDERALL) 20 MG tablet Take 1 tablet (20 mg total) by mouth 2 (two) times daily.   No facility-administered medications prior to visit.    Review of Systems  Last CBC Lab Results  Component Value Date    WBC 7.9 11/03/2022   HGB 13.4 11/03/2022   HCT 39.6 11/03/2022   MCV 93 11/03/2022   MCH 31.6 11/03/2022   RDW 11.3 (L) 11/03/2022   PLT 298 11/03/2022   Last metabolic panel Lab Results  Component Value Date   GLUCOSE 95 11/03/2022   NA 139 11/03/2022   K 5.1 11/03/2022   CL 103 11/03/2022   CO2 22 11/03/2022   BUN 15 11/03/2022   CREATININE 0.75 11/03/2022   EGFR 99 11/03/2022   CALCIUM 9.5 11/03/2022   PROT 6.9 11/03/2022   ALBUMIN 4.3 11/03/2022   LABGLOB 2.6 11/03/2022   AGRATIO 1.6 04/17/2021   BILITOT 0.7 11/03/2022   ALKPHOS 71 11/03/2022   AST 19 11/03/2022   ALT 16 11/03/2022   ANIONGAP 10 09/20/2018   Last lipids Lab Results  Component Value Date   CHOL 145 11/03/2022   HDL 55 11/03/2022   LDLCALC 79 11/03/2022   TRIG 49 11/03/2022   CHOLHDL 2.6 11/03/2022   Last hemoglobin A1c Lab Results  Component Value Date   HGBA1C 5.4 11/03/2022   Last thyroid functions Lab Results  Component Value Date   TSH 2.650 04/08/2022   Last vitamin D No results found for: "25OHVITD2", "25OHVITD3", "VD25OH"      Objective    BP (!) 98/58   Pulse 66   Ht 5\' 6"  (1.676 m)   Wt 153 lb (69.4 kg)   SpO2 99%   BMI 24.69 kg/m  BP Readings from Last 3 Encounters:  06/09/23 (!) 98/58  01/06/23 115/75  11/03/22 (!) 110/59   Wt Readings from Last 3 Encounters:  06/09/23 153 lb (69.4 kg)  01/06/23 155 lb (70.3 kg)  11/03/22 165 lb 3.2 oz (74.9 kg)        Physical Exam  General: Alert, no acute distress Cardio: Normal S1 and S2, RRR, no r/m/g Pulm: CTAB, normal work of breathing ABD: soft, abdomen is not distended, there is no tenderness to palpation, normal BS, no cva tenderness, no suprapubic tenderness   Results for orders placed or performed in visit on 06/09/23  POCT Urinalysis Dipstick  Result Value Ref Range   Color, UA yellow    Clarity, UA clear    Glucose, UA Negative Negative   Bilirubin, UA Modrate    Ketones, UA neg    Spec Grav, UA  <=1.005 (A) 1.010 - 1.025   Blood, UA small    pH, UA 6.0 5.0 - 8.0   Protein, UA Negative Negative   Urobilinogen, UA 2.0 (A) 0.2 or 1.0 E.U./dL   Nitrite, UA neg    Leukocytes, UA Negative Negative   Appearance     Odor      Assessment & Plan     Problem List Items Addressed This Visit       Other   Multiple environmental allergies   Chronic allergies with symptoms including postnasal drip and eye fatigue. Previously discontinued allergy shots due to travel. Interested in resuming to reduce reliance on medications. Referral to [facility] for evaluation and possible resumption of allergy shots. - Refer to [facility] for allergy evaluation and possible resumption of allergy shots      Relevant Orders   Ambulatory referral to ENT   ADD (attention deficit disorder) - Primary   Chronic, stable  ADHD is well-managed with Adderall 20 mg twice daily, but she prefers a sustained release. Vyvanse was effective previously but discontinued due to insomnia, which is no longer a concern. Vyvanse is expected to provide consistent medication release. - Discontinue Adderall - Start Vyvanse 30 mg daily - Follow up in one month to assess tolerance and effectiveness      Relevant Medications   lisdexamfetamine (VYVANSE) 30 MG capsule   Other Visit Diagnoses       Screening for colon cancer       Relevant Orders   Ambulatory referral to Gastroenterology     Encounter for screening mammogram for malignant neoplasm of breast       Relevant Orders   MM 3D SCREENING MAMMOGRAM BILATERAL BREAST     Dysuria       Relevant Medications   cephALEXin  (KEFLEX ) 500 MG capsule   Other Relevant Orders   Urine Culture   POCT Urinalysis Dipstick (Completed)   Urinalysis, Routine w reflex microscopic     Urinary frequency       Relevant Medications   cephALEXin  (KEFLEX ) 500 MG capsule   Other Relevant Orders   Urine Culture   POCT Urinalysis Dipstick  (Completed)   Urinalysis, Routine w reflex microscopic        Assessment & Plan Urinary Tract Infection (UTI) Symptoms include urgency and hematuria. Urinalysis shows trace leukocytes, hematuria, and moderate bilirubin. Possible Staphylococcus aureus infection post recent sexual activity. Urine culture and microscopy needed for confirmation and treatment guidance. - Order urine culture and microscopy - Prescribe Keflex  500 mg three times a day for one week    General Health Maintenance Due for routine cancer screenings. Mammogram and colonoscopy recommended. Lifestyle changes have led to weight loss and increased physical activity. Alcohol consumption reduced to recommended limits, improving overall health. - Order mammogram for breast cancer screening - Submit referral to gastroenterology for colonoscopy for colon cancer screening     Return in about 1 month (around 07/09/2023) for ADHD.       Total time spent on today's visit was 40 minutes, including both face-to-face time interviewing and examining the patient, reviewing medical record for precharting developing and discussing further evaluation,answering patient's questions, ordering referrals to specialists in addition to documenting in the patient's chart.    Mimi Alt, MD  [facility] [phone] (phone) [phone] (fax)  [facility]

## 2023-06-09 NOTE — Assessment & Plan Note (Signed)
 Chronic, stable  ADHD is well-managed with Adderall 20 mg twice daily, but she prefers a sustained release. Vyvanse was effective previously but discontinued due to insomnia, which is no longer a concern. Vyvanse is expected to provide consistent medication release. - Discontinue Adderall - Start Vyvanse 30 mg daily - Follow up in one month to assess tolerance and effectiveness

## 2023-06-10 ENCOUNTER — Encounter: Payer: Self-pay | Admitting: Family Medicine

## 2023-06-15 ENCOUNTER — Telehealth: Payer: Self-pay

## 2023-06-15 ENCOUNTER — Encounter: Payer: Self-pay | Admitting: Family Medicine

## 2023-06-15 ENCOUNTER — Other Ambulatory Visit: Payer: Self-pay

## 2023-06-15 DIAGNOSIS — Z1211 Encounter for screening for malignant neoplasm of colon: Secondary | ICD-10-CM

## 2023-06-15 LAB — URINE CULTURE

## 2023-06-15 LAB — SPECIMEN STATUS REPORT

## 2023-06-15 MED ORDER — NA SULFATE-K SULFATE-MG SULF 17.5-3.13-1.6 GM/177ML PO SOLN: 1.0000 | Freq: Once | ORAL | 0 refills | Status: AC

## 2023-06-15 NOTE — Telephone Encounter (Signed)
 Gastroenterology Pre-Procedure Review  Request Date: 06/29/23 Requesting Physician: Dr. Cornel Diesel  PATIENT REVIEW QUESTIONS: The patient responded to the following health history questions as indicated:    1. Are you having any GI issues? no 2. Do you have a personal history of Polyps? no 3. Do you have a family history of Colon Cancer or Polyps? no 4. Diabetes Mellitus? no 5. Joint replacements in the past 12 months?no 6. Major health problems in the past 3 months?no 7. Any artificial heart valves, MVP, or defibrillator?no    MEDICATIONS & ALLERGIES:    Patient reports the following regarding taking any anticoagulation/antiplatelet therapy:   Plavix, Coumadin, Eliquis, Xarelto, Lovenox, Pradaxa, Brilinta, or Effient? no Aspirin ? no  Patient confirms/reports the following medications:  Current Outpatient Medications  Medication Sig Dispense Refill   cephALEXin  (KEFLEX ) 500 MG capsule Take 1 capsule (500 mg total) by mouth 3 (three) times daily for 7 days. 21 capsule 0   hydrOXYzine  (ATARAX ) 25 MG tablet TAKE 1 TABLET BY MOUTH EVERY 6 HOURS AS NEEDED. 360 tablet 1   lisdexamfetamine (VYVANSE ) 30 MG capsule Take 1 capsule (30 mg total) by mouth daily. 30 capsule 0   SUMAtriptan  (IMITREX ) 100 MG tablet TAKE 1 TABLET BY MOUTH X 1 DOSE NEEDED MIGRAINE. MAY REPEAT IN 2 HRS IF HEADACHE PERSISTS/RECURS. 9 tablet 1   No current facility-administered medications for this visit.    Patient confirms/reports the following allergies:  Allergies  Allergen Reactions   Duloxetine  Nausea And Vomiting    CAN NOT TAKE WITH IMITREX     Iodinated Contrast Media Itching    Omnpaque i 300, 10 min delayed reaction of large hives, itching & urticaria// will require 13hr prep for future iv contrast,//a.calhoun   Meperidine     Other reaction(s): Respiratory Distress   Dilaudid [Hydromorphone]     Hard times breathing    No orders of the defined types were placed in this encounter.   AUTHORIZATION  INFORMATION Primary Insurance: 1D#: Group #:  Secondary Insurance: 1D#: Group #:  SCHEDULE INFORMATION: Date: 06/29/23 Time: Location: [facility]

## 2023-06-15 NOTE — Telephone Encounter (Signed)
 Pt requsting call back to schedule colonoscopy

## 2023-06-16 ENCOUNTER — Ambulatory Visit: Payer: Self-pay | Admitting: Family Medicine

## 2023-06-29 ENCOUNTER — Encounter
Admission: RE | Disposition: A | Discharge status: Home / Self Care | Payer: Self-pay | Source: Home / Self Care | Attending: General Surgery

## 2023-06-29 ENCOUNTER — Ambulatory Visit: Admitting: Anesthesiology

## 2023-06-29 ENCOUNTER — Encounter: Payer: Self-pay | Admitting: General Surgery

## 2023-06-29 ENCOUNTER — Ambulatory Visit
Admission: RE | Admit: 2023-06-29 | Discharge: 2023-06-29 | Disposition: A | Discharge status: Home / Self Care | Attending: General Surgery | Admitting: General Surgery

## 2023-06-29 DIAGNOSIS — D123 Benign neoplasm of transverse colon: Secondary | ICD-10-CM

## 2023-06-29 DIAGNOSIS — Z1211 Encounter for screening for malignant neoplasm of colon: Secondary | ICD-10-CM | POA: Diagnosis present

## 2023-06-29 DIAGNOSIS — K5730 Diverticulosis of large intestine without perforation or abscess without bleeding: Secondary | ICD-10-CM | POA: Insufficient documentation

## 2023-06-29 DIAGNOSIS — F17210 Nicotine dependence, cigarettes, uncomplicated: Secondary | ICD-10-CM | POA: Diagnosis not present

## 2023-06-29 DIAGNOSIS — K635 Polyp of colon: Secondary | ICD-10-CM | POA: Diagnosis not present

## 2023-06-29 DIAGNOSIS — D125 Benign neoplasm of sigmoid colon: Secondary | ICD-10-CM | POA: Diagnosis not present

## 2023-06-29 DIAGNOSIS — M797 Fibromyalgia: Secondary | ICD-10-CM | POA: Insufficient documentation

## 2023-06-29 HISTORY — PX: POLYPECTOMY: SHX149

## 2023-06-29 HISTORY — PX: COLONOSCOPY: SHX5424

## 2023-06-29 LAB — POCT PREGNANCY, URINE: Preg Test, Ur: NEGATIVE

## 2023-06-29 SURGERY — COLONOSCOPY
Anesthesia: General

## 2023-06-29 MED ORDER — SODIUM CHLORIDE 0.9 % IV SOLN: INTRAVENOUS | Status: DC

## 2023-06-29 MED ORDER — PROPOFOL 10 MG/ML IV BOLUS
INTRAVENOUS | Status: DC | PRN
  Administered 2023-06-29: 70 mg via INTRAVENOUS
  Administered 2023-06-29: 20 mg via INTRAVENOUS
  Administered 2023-06-29: 40 mg via INTRAVENOUS
  Administered 2023-06-29: 50 mg via INTRAVENOUS
  Administered 2023-06-29 (×2): 40 mg via INTRAVENOUS

## 2023-06-29 MED ORDER — EPHEDRINE SULFATE (PRESSORS) 50 MG/ML IJ SOLN
INTRAMUSCULAR | Status: AC
  Filled 2023-06-29: qty 1

## 2023-06-29 MED ORDER — PROPOFOL 500 MG/50ML IV EMUL
INTRAVENOUS | Status: DC | PRN
  Administered 2023-06-29: 175 ug/kg/min via INTRAVENOUS

## 2023-06-29 MED ORDER — EPHEDRINE 5 MG/ML INJ
10.0000 mg | Freq: Once | INTRAVENOUS | Status: DC
  Filled 2023-06-29: qty 2

## 2023-06-29 MED ORDER — EPHEDRINE SULFATE (PRESSORS) 50 MG/ML IJ SOLN
10.0000 mg | Freq: Once | INTRAMUSCULAR | Status: AC
  Administered 2023-06-29: 10 mg via INTRAVENOUS
  Filled 2023-06-29: qty 0.2

## 2023-06-29 NOTE — Anesthesia Procedure Notes (Signed)
 Procedure Name: MAC Date/Time: 06/29/2023 9:43 AM  Performed by: Brandy Almarie BROCKS, CRNAPre-anesthesia Checklist: Patient identified, Emergency Drugs available, Patient being monitored and Suction available Oxygen Delivery Method: Nasal cannula

## 2023-06-29 NOTE — Anesthesia Preprocedure Evaluation (Addendum)
 Anesthesia Evaluation  Patient identified by MRN, date of birth, ID band Patient awake    Reviewed: Allergy & Precautions, NPO status , Patient's Chart, lab work & pertinent test results  History of Anesthesia Complications Negative for: history of anesthetic complications  Airway Mallampati: I   Neck ROM: Full    Dental no notable dental hx.    Pulmonary Current Smoker (1/2 ppd) and Patient abstained from smoking.   Pulmonary exam normal breath sounds clear to auscultation       Cardiovascular Exercise Tolerance: Good negative cardio ROS Normal cardiovascular exam Rhythm:Regular Rate:Normal     Neuro/Psych  Headaches PSYCHIATRIC DISORDERS (ADD) Anxiety Depression    Hx alcohol and benzodiazepine use disorder, none since 2019    GI/Hepatic PUD,,,  Endo/Other  negative endocrine ROS    Renal/GU negative Renal ROS     Musculoskeletal  (+)  Fibromyalgia -  Abdominal   Peds  Hematology negative hematology ROS (+)   Anesthesia Other Findings   Reproductive/Obstetrics                             Anesthesia Physical Anesthesia Plan  ASA: 2  Anesthesia Plan: General   Post-op Pain Management:    Induction: Intravenous  PONV Risk Score and Plan: 2 and Propofol  infusion, TIVA and Treatment may vary due to age or medical condition  Airway Management Planned: Natural Airway  Additional Equipment:   Intra-op Plan:   Post-operative Plan:   Informed Consent: I have reviewed the patients History and Physical, chart, labs and discussed the procedure including the risks, benefits and alternatives for the proposed anesthesia with the patient or authorized representative who has indicated his/her understanding and acceptance.       Plan Discussed with: CRNA  Anesthesia Plan Comments: (LMA/GETA backup discussed.  Patient consented for risks of anesthesia including but not limited to:  -  adverse reactions to medications - damage to eyes, teeth, lips or other oral mucosa - nerve damage due to positioning  - sore throat or hoarseness - damage to heart, brain, nerves, lungs, other parts of body or loss of life  Informed patient about role of CRNA in peri- and intra-operative care.  Patient voiced understanding.)       Anesthesia Quick Evaluation

## 2023-06-29 NOTE — Op Note (Addendum)
 [facility] Gastroenterology Patient Name: Krystal Marks Procedure Date: 06/29/2023 9:35 AM MRN: [id number] Account #: [id number] Date of Birth: [birth year] Admit Type: Outpatient Age: 48 Room: [facility] ENDO ROOM 1 Gender: Female Note Status: Supervisor Override Instrument Name: Arvis [id number] Procedure:             Colonoscopy Indications:           Screening for colorectal malignant neoplasm Providers:             Jayson KIDD. Marinda, MD Referring MD:          Jayson KIDD. Marinda, MD (Referring MD), Rockie Shed                         (Referring MD) Medicines:             See the Anesthesia note for documentation of the                         administered medications Complications:         No immediate complications. Estimated blood loss:                         Minimal. Procedure:             Pre-Anesthesia Assessment:                        - Prior to the procedure, a History and Physical was                         performed, and patient medications and allergies were                         reviewed. The patient's tolerance of previous                         anesthesia was also reviewed. The risks and benefits                         of the procedure and the sedation options and risks                         were discussed with the patient. All questions were                         answered, and informed consent was obtained. Prior                         Anticoagulants: The patient has taken no anticoagulant                         or antiplatelet agents. ASA Grade Assessment: II - A                         patient with mild systemic disease. After reviewing                         the risks and benefits, the patient was deemed in  satisfactory condition to undergo the procedure.                        After obtaining informed consent, the colonoscope was                         passed under direct vision. Throughout the  procedure,                         the patient's blood pressure, pulse, and oxygen                         saturations were monitored continuously. The                         Colonoscope was introduced through the anus and                         advanced to the the cecum, identified by the                         appendiceal orifice. The entire colon was well                         visualized. The colonoscopy was performed without                         difficulty. The patient tolerated the procedure well.                         The quality of the bowel preparation was adequate. Findings:      The perianal and digital rectal examinations were normal.      A single small-mouthed diverticulum was found in the distal sigmoid       colon. The polyp was removed with a cold biopsy forceps. Resection was       complete, and retrieval was complete.      A 3 mm polyp was found in the distal sigmoid colon. The polyp was       sessile. The polyp was removed with a jumbo cold forceps. Resection and       retrieval were complete. Estimated blood loss was minimal.      A 2 mm polyp was found in the hepatic flexure. The polyp was sessile.       The polyp was removed with a cold biopsy forceps. Resection and       retrieval were complete. Impression:            - Diverticulosis in the distal sigmoid colon.                        - One 3 mm polyp in the distal sigmoid colon, removed                         with a jumbo cold forceps. Resected and retrieved.                        - One 2 mm polyp at the hepatic flexure, removed with  a cold biopsy forceps. Resected and retrieved. Recommendation:        - Await pathology results.                        - Discharge patient to home (ambulatory). Procedure Code(s):     --- Professional ---                        [phone], Colonoscopy, flexible; with biopsy, single or                         multiple CPT copyright 2022 American  Medical Association. All rights reserved. The codes documented in this report are preliminary and upon coder review may  be revised to meet current compliance requirements. Jayson MALVA Endow, MD 06/29/2023 10:42:46 AM Number of Addenda: 0 Note Initiated On: 06/29/2023 9:35 AM Scope Withdrawal Time: 0 hours 19 minutes 35 seconds  Total Procedure Duration: 0 hours 47 minutes 34 seconds  Estimated Blood Loss:  Estimated blood loss was minimal.      [facility]

## 2023-06-29 NOTE — Discharge Instructions (Signed)
YOU HAD AN ENDOSCOPIC PROCEDURE TODAY: Refer to the procedure report that was given to you for any specific questions about what was found during the examination.  If the procedure report does not answer your questions, please call your gastroenterologist to clarify. ° °YOU SHOULD EXPECT: Some feelings of bloating in the abdomen. Passage of more gas than usual.  Walking can help get rid of the air that was put into your GI tract during the procedure and reduce the bloating. If you had a lower endoscopy (such as a colonoscopy or flexible sigmoidoscopy) you may notice spotting of blood in your stool or on the toilet paper.  ° °DIET: Your first meal following the procedure should be a light meal and then it is ok to progress to your normal diet.  A half-sandwich or bowl of soup is an example of a good first meal.  Heavy or fried foods are harder to digest and may make you feel nasueas or bloated.  Drink plenty of fluids but you should avoid alcoholic beverages for 24 hours. ° °ACTIVITY: Your care partner should take you home directly after the procedure.  You should plan to take it easy, moving slowly for the rest of the day.  You can resume normal activity the day after the procedure however you should NOT DRIVE, make legal decisions or use heavy machinery for 24 hours (because of the sedation medicines used during the test).   ° °SYMPTOMS TO REPORT IMMEDIATELY  °A gastroenterologist can be reached at any hour.  Please call your doctor's office for any of the following symptoms: ° °· Following lower endoscopy (colonoscopy, flexible sigmoidoscopy) ° Excessive amounts of blood in the stool ° Significant tenderness, worsening of abdominal pains ° Swelling of the abdomen that is new, acute ° Fever of 100° or higher °· Following upper endoscopy (EGD, EUS, ERCP) ° Vomiting of blood or coffee ground material ° New, significant abdominal pain ° New, significant chest pain or pain under the shoulder blades ° Painful or  persistently difficult swallowing ° New shortness of breath ° Black, tarry-looking stools ° °FOLLOW UP: °If any biopsies were taken you will be contacted by phone or by letter within the next 1-3 weeks.  Call your gastroenterologist if you have not heard about the biopsies in 3 weeks.  ° °Please also call your gastroenterologist's office with any specific questions about appointments or follow up tests. °

## 2023-06-29 NOTE — Progress Notes (Signed)
 Pt with decreased heart rate. MD called and ordered 10mg  IV Ephedrine. Anesthesia  MD to give medication. Pt alert, but drowsy.

## 2023-06-29 NOTE — Transfer of Care (Signed)
 Immediate Anesthesia Transfer of Care Note  Patient: Krystal Marks  Procedure(s) Performed: COLONOSCOPY POLYPECTOMY, INTESTINE  Patient Location: PACU  Anesthesia Type:General  Level of Consciousness: drowsy  Airway & Oxygen Therapy: Patient Spontanous Breathing  Post-op Assessment: Report given to RN, Post -op Vital signs reviewed and stable, and Patient moving all extremities X 4  Post vital signs: Reviewed and stable  Last Vitals:  Vitals Value Taken Time  BP 83/58 06/29/23 10:39  Temp    Pulse 51 06/29/23 10:39  Resp 17 06/29/23 10:39  SpO2 100 % 06/29/23 10:39    Last Pain:  Vitals:   06/29/23 1039  TempSrc:   PainSc: 0-No pain         Complications: No notable events documented.

## 2023-06-29 NOTE — Anesthesia Postprocedure Evaluation (Signed)
 Anesthesia Post Note  Patient: Krystal Marks  Procedure(s) Performed: COLONOSCOPY POLYPECTOMY, INTESTINE  Patient location during evaluation: PACU Anesthesia Type: General Level of consciousness: awake and alert, oriented and patient cooperative Pain management: pain level controlled Vital Signs Assessment: post-procedure vital signs reviewed and stable Respiratory status: spontaneous breathing, nonlabored ventilation and respiratory function stable Cardiovascular status: blood pressure returned to baseline and stable Postop Assessment: adequate PO intake Anesthetic complications: no   No notable events documented.   Last Vitals:  Vitals:   06/29/23 1129 06/29/23 1139  BP: 105/65 105/62  Pulse: (!) 45 (!) 55  Resp: 15 16  Temp:    SpO2: 99% 99%    Last Pain:  Vitals:   06/29/23 1139  TempSrc:   PainSc: 0-No pain                 Alfonso Ruths

## 2023-06-29 NOTE — H&P (Signed)
 Primary Care Physician:  Sharma Coyer, MD Primary Gastroenterologist:  Dr. Marinda  Pre-Procedure History & Physical: HPI:  Krystal Marks is a 48 y.o. female is here for an colonoscopy.   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   Benzodiazepine abuse (HCC) 05/25/2017   Benzodiazepine overdose 05/25/2017   Cigarette smoker 08/16/2014   Depression    Depression, major, single episode, moderate (HCC) 08/16/2014   Fatigue 01/20/2017   Fibromyalgia    Gastroduodenal ulcer 08/16/2014   IBS (irritable bowel syndrome)    Irregular bleeding 08/16/2014   Migraine 08/16/2014   Migraine headache    monthly   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    Past Surgical History:  Procedure Laterality Date   BREAST ENHANCEMENT SURGERY  2007   COLONOSCOPY     ESOPHAGOGASTRODUODENOSCOPY     x4   KNEE ARTHROSCOPY WITH PATELLA RECONSTRUCTION Left 07/19/2017   Procedure: KNEE ARTHROSCOPY PARTIAL MEDIAL MENISECTOMY PATELLA CHONDROPLASTY;  Surgeon: Tobie Priest, MD;  Location: [facility];  Service: Orthopedics;  Laterality: Left;   SHOULDER ARTHROSCOPY Right     Prior to Admission medications   Medication Sig Start Date End Date Taking? Authorizing Provider  amphetamine -dextroamphetamine  (ADDERALL XR) 20 MG 24 hr capsule Take 20 mg by mouth daily.   Yes [provider]  hydrOXYzine  (ATARAX ) 25 MG tablet TAKE 1 TABLET BY MOUTH EVERY 6 HOURS AS NEEDED. 01/04/23  Yes Simmons-Robinson, Makiera, MD  lisdexamfetamine (VYVANSE ) 30 MG capsule Take 1 capsule (30 mg total) by mouth daily. 06/09/23  Yes Simmons-Robinson, Makiera, MD  SUMAtriptan  (IMITREX ) 100 MG tablet TAKE 1 TABLET BY MOUTH X 1 DOSE NEEDED MIGRAINE. MAY REPEAT IN 2 HRS IF HEADACHE PERSISTS/RECURS. 06/16/22  Yes  Simmons-Robinson, Makiera, MD    Allergies as of 06/15/2023 - Review Complete 06/15/2023  Allergen Reaction Noted   Duloxetine  Nausea And Vomiting 04/28/2016   Iodinated contrast media Itching 01/19/2012   Meperidine  03/14/2007   Dilaudid [hydromorphone]  12/18/2014    Family History  Problem Relation Age of Onset   Healthy Mother    Healthy Father    Healthy Brother    Osteoporosis Maternal Grandmother    Esophageal cancer Maternal Grandfather    Colon cancer Neg Hx    Breast cancer Neg Hx     Social History   Socioeconomic History   Marital status: Married    Spouse name: Not on file   Number of children: Not on file   Years of education: Not on file   Highest education level: Associate degree: occupational, Scientist, product/process development, or vocational program  Occupational History   Not on file  Tobacco Use   Smoking status: Every Day    Current packs/day: 0.00    Average packs/day: 0.5 packs/day for 20.0 years (10.0 ttl pk-yrs)    Types: Cigarettes    Start date: 06/03/1997    Last attempt to quit: 06/03/2017    Years since quitting: 6.0   Smokeless tobacco: Never   Tobacco comments:       Vaping Use   Vaping status: Never Used  Substance and Sexual Activity   Alcohol use: Yes    Alcohol/week: 4.0 standard drinks of alcohol    Types: 4 Glasses of wine per week   Drug use: No   Sexual activity: Yes  Other Topics Concern   Not  on file  Social History Narrative   Not on file   Social Drivers of Health   Financial Resource Strain: Low Risk  (01/05/2023)   Overall Financial Resource Strain (CARDIA)    Difficulty of Paying Living Expenses: Not hard at all  Food Insecurity: No Food Insecurity (01/05/2023)   Hunger Vital Sign    Worried About Running Out of Food in the Last Year: Never true    Ran Out of Food in the Last Year: Never true  Transportation Needs: No Transportation Needs (01/05/2023)   PRAPARE - Administrator, Civil Service (Medical): No    Lack of  Transportation (Non-Medical): No  Physical Activity: Insufficiently Active (01/05/2023)   Exercise Vital Sign    Days of Exercise per Week: 3 days    Minutes of Exercise per Session: 30 min  Stress: No Stress Concern Present (01/05/2023)   [employer] of Occupational Health - Occupational Stress Questionnaire    Feeling of Stress : Not at all  Social Connections: Socially Integrated (01/05/2023)   Social Connection and Isolation Panel    Frequency of Communication with Friends and Family: Three times a week    Frequency of Social Gatherings with Friends and Family: Once a week    Attends Religious Services: More than 4 times per year    Active Member of [employer] or Organizations: No    Attends Engineer, structural: 1 to 4 times per year    Marital Status: Married  Catering manager Violence: Not on file    Review of Systems: See HPI, otherwise negative ROS  Physical Exam: BP 105/62   Pulse (!) 55   Temp (!) 96.7 F (35.9 C) (Temporal)   Resp 16   Wt 66.6 kg   SpO2 99%   BMI 23.69 kg/m  General:   Alert,  pleasant and cooperative in NAD Head:  Normocephalic and atraumatic. Neck:  Supple; no masses or thyromegaly. Lungs:  Clear throughout to auscultation.    Heart:  Regular rate and rhythm. Abdomen:  Soft, nontender and nondistended. Normal bowel sounds, without guarding, and without rebound.   Neurologic:  Alert and  oriented x4;  grossly normal neurologically.  Impression/Plan: Krystal Marks is here for an colonoscopy to be performed for screening  Risks, benefits, limitations, and alternatives regarding  colonoscopy have been reviewed with the patient.  Questions have been answered.  All parties agreeable.   Jayson MALVA Endow, MD  06/29/2023,

## 2023-06-30 ENCOUNTER — Encounter: Payer: Self-pay | Admitting: General Surgery

## 2023-06-30 LAB — SURGICAL PATHOLOGY

## 2023-07-11 ENCOUNTER — Telehealth (INDEPENDENT_AMBULATORY_CARE_PROVIDER_SITE_OTHER): Admitting: Family Medicine

## 2023-07-11 ENCOUNTER — Encounter: Payer: Self-pay | Admitting: Family Medicine

## 2023-07-11 DIAGNOSIS — F411 Generalized anxiety disorder: Secondary | ICD-10-CM

## 2023-07-11 DIAGNOSIS — F900 Attention-deficit hyperactivity disorder, predominantly inattentive type: Secondary | ICD-10-CM

## 2023-07-11 DIAGNOSIS — N952 Postmenopausal atrophic vaginitis: Secondary | ICD-10-CM

## 2023-07-11 DIAGNOSIS — R198 Other specified symptoms and signs involving the digestive system and abdomen: Secondary | ICD-10-CM

## 2023-07-11 DIAGNOSIS — N926 Irregular menstruation, unspecified: Secondary | ICD-10-CM

## 2023-07-11 MED ORDER — LISDEXAMFETAMINE DIMESYLATE 40 MG PO CAPS: 40.0000 mg | ORAL_CAPSULE | ORAL | 0 refills | Status: DC

## 2023-07-11 MED ORDER — HYDROXYZINE HCL 50 MG PO TABS: 75.0000 mg | ORAL_TABLET | Freq: Four times a day (QID) | ORAL | 3 refills | Status: DC | PRN

## 2023-07-11 MED ORDER — ESTRADIOL 0.1 MG/GM VA CREA: 1.0000 | TOPICAL_CREAM | Freq: Every day | VAGINAL | 12 refills | Status: AC

## 2023-07-11 NOTE — Progress Notes (Signed)
 MyChart Video Visit    Virtual Visit via Video Note   This format is felt to be most appropriate for this patient at this time. Physical exam was limited by quality of the video and audio technology used for the visit.   Patient location: Patient's home address   Provider location: [facility]  [address], [address]  [place], [place] [address]   I discussed the limitations of evaluation and management by telemedicine and the availability of in person appointments. The patient expressed understanding and agreed to proceed.  Patient: Krystal Marks   DOB: Feb 14, 1975   48 y.o. Female  MRN: [id number] Visit Date: 07/11/2023  Today's healthcare provider: Rockie Agent, MD   No chief complaint on file.  Subjective    HPI   Discussed the use of AI scribe software for clinical note transcription with the patient, who gave verbal consent to proceed.  History of Present Illness Krystal Marks is a 48 year old female who presents for follow-up on Vyvanse  efficacy and side effects.  She was switched from Adderall 20 mg twice daily to Vyvanse  30 mg daily. While Vyvanse  seems to be working, it does not feel as strong as the two doses of Adderall. Her focus is described as 'lazy', and she has been supplementing with half of a 20 mg Adderall to achieve better focus. She mentions that she was eventually prescribed 40 mg of Vyvanse  but started at a lower dose to monitor its effects.  She requests a refill for hydroxyzine  25 mg, which she primarily uses for sleep, taking three to four tablets at bedtime. She has also used it for anxiety. Her prescription was not filled through insurance due to an outdated prescription.  She experiences hot flashes occurring daily, mood changes, and vaginal dryness, which began a week ago. Her menstrual periods have become irregular, with heavier flow on the second day and lasting about four days.  She notes changes in her  bowel habits, including straining and incomplete evacuation, which started two weeks ago after a colonoscopy where two polyps were removed. She has experienced nausea, vomiting, and gastrointestinal pain, along with acid reflux, for which she still has lansoprazole  available.  She describes urinary dribbling after voiding, which she finds annoying but not severe enough to seek further evaluation at this time. She has previously done pelvic floor exercises but not regularly.      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 MAY REPEAT IN 2 HRS IF HEADACHE PERSISTS/RECURS.   [DISCONTINUED] amphetamine -dextroamphetamine  (ADDERALL XR) 20 MG 24 hr capsule Take 20 mg by mouth daily.   [DISCONTINUED] hydrOXYzine  (ATARAX ) 25 MG tablet TAKE 1 TABLET BY MOUTH EVERY 6 HOURS AS NEEDED.   [DISCONTINUED] lisdexamfetamine (  VYVANSE ) 30 MG capsule Take 1 capsule (30 mg total) by mouth daily.   No facility-administered medications prior to visit.    Review of Systems      Objective    There were no vitals taken for this visit.      Physical Exam Vitals reviewed.  Constitutional:      General: She is not in acute distress.     Appearance: Normal appearance. She is not ill-appearing.  Pulmonary:     Effort: Pulmonary effort is normal. No respiratory distress.  Neurological:     Mental Status: She is alert and oriented to person, place, and time.  Psychiatric:        Mood and Affect: Mood normal.        Behavior: Behavior normal.        Thought Content: Thought content normal.        Assessment & Plan     Problem List Items Addressed This Visit       Other   Irregular bleeding   Attention deficit hyperactivity disorder, predominantly inattentive type - Primary   Relevant Medications   lisdexamfetamine (VYVANSE ) 40 MG capsule   lisdexamfetamine (VYVANSE ) 40 MG capsule (Start on 08/11/2023)   Anxiety, generalized   Relevant Medications   hydrOXYzine  (ATARAX ) 50 MG tablet   Other Visit Diagnoses       Vaginal atrophy       Relevant Medications   estradiol  (ESTRACE  VAGINAL) 0.1 MG/GM vaginal cream     Abnormal bowel movement       Relevant Orders   Ambulatory referral to Gastroenterology      Assessment & Plan Attention Deficit Hyperactivity Disorder (ADHD) Switched from Adderall 20 mg twice daily to Vyvanse  30 mg daily. Reports Vyvanse  is less effective than Adderall for focus. Supplementing with half of a 20 mg Adderall tablet. Plan to increase Vyvanse  to 40 mg daily to improve efficacy. - Increase Vyvanse  to 40 mg daily - Monitor efficacy and side effects of Vyvanse  - Instruct to report any issues with pharmacy regarding Vyvanse  prescription  Perimenopause Reports symptoms consistent with perimenopause: hot flashes, mood changes, vaginal dryness, and irregular periods with increased heaviness on the second day. Interested in hormone therapy options. Discussed estrogen and progesterone therapy to balance hormones and reduce cancer risk. Vaginal estrogen prescribed to alleviate dryness and discomfort during intercourse. - Prescribe vaginal estrogen (Estrace ) to be applied every night for two  weeks, then two to three times a week for maintenance - Discuss hormone therapy options including estrogen and progesterone therapy - Schedule follow-up appointment in 4-6 weeks to assess treatment efficacy  Gastrointestinal Symptoms Post-Colonoscopy Reports gastrointestinal changes post-colonoscopy: altered bowel habits, acid reflux, nausea, vomiting, and abdominal pain. Symptoms began approximately two weeks ago. Colonoscopy report normal, but two polyps removed. Has not discussed symptoms with GI specialist. Referral to GI specialist for evaluation is planned. - Refer to GI specialist for evaluation of post-colonoscopy gastrointestinal symptoms - Continue Lansoprazole  for acid reflux as needed  General Health Maintenance Maintaining weight loss, staying active, and staying hydrated. Recent colonoscopy with normal findings except for removal of two polyps.     No follow-ups on file.     I discussed the assessment and treatment plan with the patient. The patient was provided an opportunity to ask questions and all were answered. The patient agreed with the plan and demonstrated an understanding of the instructions.   The patient was advised to call back or seek an in-person evaluation if  the symptoms worsen or if the condition fails to improve as anticipated.  I provided 27 minutes of non-face-to-face time during this encounter.   Rockie Agent, MD [facility] [phone] (phone) [phone] (fax)  [facility]

## 2023-08-02 ENCOUNTER — Other Ambulatory Visit: Payer: Self-pay | Admitting: Family Medicine

## 2023-08-02 DIAGNOSIS — F411 Generalized anxiety disorder: Secondary | ICD-10-CM

## 2023-08-09 DIAGNOSIS — J301 Allergic rhinitis due to pollen: Secondary | ICD-10-CM | POA: Diagnosis not present

## 2023-08-12 DIAGNOSIS — J301 Allergic rhinitis due to pollen: Secondary | ICD-10-CM | POA: Diagnosis not present

## 2023-08-16 DIAGNOSIS — J301 Allergic rhinitis due to pollen: Secondary | ICD-10-CM | POA: Diagnosis not present

## 2023-08-30 DIAGNOSIS — J301 Allergic rhinitis due to pollen: Secondary | ICD-10-CM | POA: Diagnosis not present

## 2023-09-13 DIAGNOSIS — J301 Allergic rhinitis due to pollen: Secondary | ICD-10-CM | POA: Diagnosis not present

## 2023-09-20 DIAGNOSIS — J301 Allergic rhinitis due to pollen: Secondary | ICD-10-CM | POA: Diagnosis not present

## 2023-10-05 ENCOUNTER — Other Ambulatory Visit: Payer: Self-pay | Admitting: Family Medicine

## 2023-10-11 DIAGNOSIS — J301 Allergic rhinitis due to pollen: Secondary | ICD-10-CM | POA: Diagnosis not present

## 2023-11-07 ENCOUNTER — Ambulatory Visit: Payer: Self-pay | Admitting: Family Medicine

## 2023-11-07 VITALS — BP 114/77 | HR 73 | Temp 98.6°F | Ht 66.0 in | Wt 160.5 lb

## 2023-11-07 DIAGNOSIS — F321 Major depressive disorder, single episode, moderate: Secondary | ICD-10-CM

## 2023-11-07 DIAGNOSIS — Z0000 Encounter for general adult medical examination without abnormal findings: Secondary | ICD-10-CM

## 2023-11-07 DIAGNOSIS — R140 Abdominal distension (gaseous): Secondary | ICD-10-CM | POA: Diagnosis not present

## 2023-11-07 DIAGNOSIS — Z131 Encounter for screening for diabetes mellitus: Secondary | ICD-10-CM

## 2023-11-07 DIAGNOSIS — G43919 Migraine, unspecified, intractable, without status migrainosus: Secondary | ICD-10-CM

## 2023-11-07 DIAGNOSIS — M2550 Pain in unspecified joint: Secondary | ICD-10-CM

## 2023-11-07 DIAGNOSIS — R5383 Other fatigue: Secondary | ICD-10-CM

## 2023-11-07 DIAGNOSIS — Z0001 Encounter for general adult medical examination with abnormal findings: Secondary | ICD-10-CM

## 2023-11-07 DIAGNOSIS — M797 Fibromyalgia: Secondary | ICD-10-CM

## 2023-11-07 DIAGNOSIS — F411 Generalized anxiety disorder: Secondary | ICD-10-CM | POA: Diagnosis not present

## 2023-11-07 DIAGNOSIS — F900 Attention-deficit hyperactivity disorder, predominantly inattentive type: Secondary | ICD-10-CM

## 2023-11-07 DIAGNOSIS — Z13220 Encounter for screening for lipoid disorders: Secondary | ICD-10-CM

## 2023-11-07 DIAGNOSIS — Z130 Encounter for screening for diseases of the blood and blood-forming organs and certain disorders involving the immune mechanism: Secondary | ICD-10-CM

## 2023-11-07 MED ORDER — AMPHETAMINE-DEXTROAMPHET ER 30 MG PO CP24: 30.0000 mg | ORAL_CAPSULE | ORAL | 0 refills | Status: DC

## 2023-11-07 NOTE — Progress Notes (Signed)
 Complete physical exam   Patient: Krystal Marks   DOB: December 14, 1975   48 y.o. Female  MRN: [id number] Visit Date: 11/07/2023  Today's healthcare provider: Rockie Agent, MD   Chief Complaint  Patient presents with   Annual Exam    Diet- General Exercise- Somewhat, hiking Overall feeling- More fatigued than usual, joints seem to bother her. Sleep- Toss and turn, wakes up with neck and back pain possibly because of new mattress Concerns- wanting to increase adderrall and wants to talk about her getting tests for gluten; States had headaches recently  Declined vaccines   Subjective    Krystal Marks is a 48 y.o. female who presents today for a complete physical exam.    Krystal Marks does have additional problems to discuss today.   Discussed the use of AI scribe software for clinical note transcription with the patient, who gave verbal consent to proceed.  History of Present Illness Krystal Marks is a 48 year old female who presents for an annual physical exam.  Krystal Marks experiences fatigue and joint pain, which Krystal Marks attributes to difficulty adjusting to a new mattress. For the past two weeks, Krystal Marks has been waking up with back and neck pain, describing it as soreness along her spine. Krystal Marks suspects her diet may contribute to her symptoms, as Krystal Marks experiences bloating and stomach issues after consuming certain foods, such as pizza.  Krystal Marks is currently taking Adderall 20 mg twice daily for ADD. Krystal Marks previously tried Vyvanse  but found it ineffective compared to Adderall.  Krystal Marks has a history of fibromyalgia and reports experiencing widespread pain, particularly in her back. Krystal Marks has tried dietary changes in the past, such as the Octavia diet, which helped alleviate some pain.  Krystal Marks has a family history of rheumatoid arthritis on her father's side. Krystal Marks has not been tested for rheumatoid arthritis but is concerned about joint pain and inflammation. Krystal Marks also mentions a past miscarriage and  is curious about a potential link between celiac disease and infertility.  Her current medications include estrogen cream, hydrazine, Imitrex , and Adderall.     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   Benzodiazepine abuse (HCC) 05/25/2017   Benzodiazepine overdose 05/25/2017   Cancer (HCC) 2021   Basal cell   Cigarette smoker 08/16/2014   Depression    Depression, major, single episode, moderate (HCC) 08/16/2014   Fatigue 01/20/2017   Fibromyalgia    Gastroduodenal ulcer 08/16/2014   GERD (gastroesophageal reflux disease) 2003   IBS (irritable bowel syndrome)    Irregular bleeding 08/16/2014   Migraine 08/16/2014   Migraine headache    monthly   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   Past Surgical History:  Procedure Laterality Date   BREAST ENHANCEMENT SURGERY  2007   COLONOSCOPY     COLONOSCOPY N/A 06/29/2023   Procedure: COLONOSCOPY;  Surgeon: Marinda Jayson KIDD, MD;  Location: [facility] ENDOSCOPY;  Service: General;  Laterality: N/A;   COSMETIC SURGERY  2006   Bilateral breast augmentation   ESOPHAGOGASTRODUODENOSCOPY     x4   KNEE ARTHROSCOPY WITH PATELLA RECONSTRUCTION Left 07/19/2017   Procedure: KNEE ARTHROSCOPY PARTIAL MEDIAL MENISECTOMY PATELLA CHONDROPLASTY;  Surgeon: Tobie Priest, MD;  Location: [facility];  Service: Orthopedics;  Laterality: Left;   POLYPECTOMY  06/29/2023   Procedure: POLYPECTOMY, INTESTINE;  Surgeon: Marinda Jayson KIDD, MD;  Location: [facility] END[facility]COPY;  Service: General;;   SHOULDER ARTHROSCOPY Right    Social History   Socioeconomic History   Marital status: Married    Spouse name: Not on file   Number of children: Not on file   Years of education: Not on file   Highest education level: Associate degree: academic  program  Occupational History   Not on file  Tobacco Use   Smoking status: Every Day    Current packs/day: 0.00    Average packs/day: 0.5 packs/day for 20.0 years (10.0 ttl pk-yrs)    Types: Cigarettes    Start date: 06/03/1997    Last attempt to quit: 06/03/2017    Years since quitting: 6.4   Smokeless tobacco: Never   Tobacco comments:       Vaping Use   Vaping status: Never Used  Substance and Sexual Activity   Alcohol use: Yes    Alcohol/week: 4.0 standard drinks of alcohol    Types: 3 Cans of beer, 1 Shots of liquor per week   Drug use: No   Sexual activity: Yes  Other Topics Concern   Not on file  Social History Narrative   Not on file   Social Drivers of Health   Financial Resource Strain: Low Risk  (11/07/2023)   Overall Financial Resource Strain (CARDIA)    Difficulty of Paying Living Expenses: Not hard at all  Food Insecurity: No Food Insecurity (11/07/2023)   Hunger Vital Sign    Worried About Running Out of Food in the Last Year: Never true    Ran Out of Food in the Last Year: Never true  Transportation Needs: No Transportation Needs (11/07/2023)   PRAPARE - Administrator, Civil Service (Medical): No    Lack of Transportation (Non-Medical): No  Physical Activity: Insufficiently Active (11/07/2023)   Exercise Vital Sign    Days of Exercise per Week: 2 days    Minutes of Exercise per Session: 30 min  Stress: No Stress Concern Present (11/07/2023)   [employer] of Occupational Health - Occupational Stress Questionnaire    Feeling of Stress: Not at all  Social Connections: Socially Integrated (11/07/2023)   Social Connection and Isolation Panel    Frequency of Communication with Friends and Family: Twice a week    Frequency of Social Gatherings with Friends and Family: Once a week    Attends Religious Services: More than 4 times per year    Active Member of [employer] or Organizations: Yes    Attends Banker Meetings: Never    Marital  Status: Married  Catering Manager Violence: Not on file   Family Status  Relation Name Status   Mother  Alive   Father Renay Glasser Deceased   Brother  Alive   MGM Drucilla Long Alive   MGF Karleen Long Deceased   PGM [place] Alive   Mat Uncle Fred Long Alive   Neg Hx  (Not Specified)  No partnership data on file   Family History  Problem Relation Age of Onset   Healthy Mother    Early death Father    Healthy Brother    Osteoporosis Maternal Grandmother    Stroke Maternal Grandmother    Esophageal cancer Maternal Grandfather    Cancer Maternal Grandfather    Arthritis Paternal Grandmother    Cancer Maternal Uncle    Colon cancer Neg Hx    Breast cancer Neg Hx    Allergies  Allergen  Reactions   Duloxetine  Nausea And Vomiting    CAN NOT TAKE WITH IMITREX     Iodinated Contrast Media Itching    Omnpaque i 300, 10 min delayed reaction of large hives, itching & urticaria// will require 13hr prep for future iv contrast,//a.calhoun   Meperidine     Other reaction(s): Respiratory Distress   Dilaudid [Hydromorphone]     Hard times breathing     Medications: Outpatient Medications Prior to Visit  Medication Sig Note   EPINEPHrine  0.3 mg/0.3 mL IJ SOAJ injection Inject into the muscle once as needed.    estradiol  (ESTRACE  VAGINAL) 0.1 MG/GM vaginal cream Place 1 Applicatorful vaginally at bedtime.    hydrOXYzine  (ATARAX ) 50 MG tablet TAKE 1.5-2 TABLETS (75-100 MG TOTAL) BY MOUTH EVERY 6 (SIX) HOURS AS NEEDED.    SUMAtriptan  (IMITREX ) 100 MG tablet TAKE 1 TABLET BY MOUTH X 1 DOSE NEEDED MIGRAINE. MAY REPEAT IN 2 HRS IF HEADACHE PERSISTS/RECURS.    [DISCONTINUED] amphetamine -dextroamphetamine  (ADDERALL) 20 MG tablet Take 20 mg by mouth 2 (two) times daily.    [DISCONTINUED] lisdexamfetamine (VYVANSE ) 40 MG capsule Take 1 capsule (40 mg total) by mouth every morning. 11/07/2023: Wasn't helping at all.   [DISCONTINUED] lisdexamfetamine (VYVANSE ) 40 MG capsule Take 1 capsule (40 mg  total) by mouth every morning. 11/07/2023: Wasn't helping at all.   No facility-administered medications prior to visit.    Review of Systems  Last CBC Lab Results  Component Value Date   WBC 7.9 11/03/2022   HGB 13.4 11/03/2022   HCT 39.6 11/03/2022   MCV 93 11/03/2022   MCH 31.6 11/03/2022   RDW 11.3 (L) 11/03/2022   PLT 298 11/03/2022   Last metabolic panel Lab Results  Component Value Date   GLUCOSE 95 11/03/2022   NA 139 11/03/2022   K 5.1 11/03/2022   CL 103 11/03/2022   CO2 22 11/03/2022   BUN 15 11/03/2022   CREATININE 0.75 11/03/2022   EGFR 99 11/03/2022   CALCIUM 9.5 11/03/2022   PROT 6.9 11/03/2022   ALBUMIN 4.3 11/03/2022   LABGLOB 2.6 11/03/2022   AGRATIO 1.6 04/17/2021   BILITOT 0.7 11/03/2022   ALKPHOS 71 11/03/2022   AST 19 11/03/2022   ALT 16 11/03/2022   ANIONGAP 10 09/20/2018   Last lipids Lab Results  Component Value Date   CHOL 145 11/03/2022   HDL 55 11/03/2022   LDLCALC 79 11/03/2022   TRIG 49 11/03/2022   CHOLHDL 2.6 11/03/2022   Last hemoglobin A1c Lab Results  Component Value Date   HGBA1C 5.4 11/03/2022   Last thyroid functions Lab Results  Component Value Date   TSH 2.650 04/08/2022   FREET4 1.02 04/08/2022   Last vitamin D No results found for: 25OHVITD2, 25OHVITD3, VD25OH Last vitamin B12 and Folate No results found for: VITAMINB12, FOLATE     Objective    BP 114/77 (BP Location: Right Arm, Patient Position: Sitting, Cuff Size: Normal)   Pulse 73   Temp 98.6 F (37 C) (Oral)   Ht 5' 6 (1.676 m)   Wt 160 lb 8 oz (72.8 kg)   SpO2 99%   BMI 25.91 kg/m   BP Readings from Last 3 Encounters:  11/07/23 114/77  06/29/23 105/62  06/09/23 (!) 98/58   Wt Readings from Last 3 Encounters:  11/07/23 160 lb 8 oz (72.8 kg)  06/29/23 146 lb 12.8 oz (66.6 kg)  06/09/23 153 lb (69.4 kg)        Physical Exam Vitals reviewed.  Constitutional:      General: Krystal Marks is not in acute distress.    Appearance:  Normal appearance. Krystal Marks is not ill-appearing, toxic-appearing or diaphoretic.  HENT:     Head: Normocephalic and atraumatic.     Right Ear: Tympanic membrane and external ear normal. There is no impacted cerumen.     Left Ear: Tympanic membrane and external ear normal. There is no impacted cerumen.     Nose: Nose normal.     Mouth/Throat:     Pharynx: Oropharynx is clear.  Eyes:     General: No scleral icterus.    Extraocular Movements: Extraocular movements intact.     Conjunctiva/sclera: Conjunctivae normal.     Pupils: Pupils are equal, round, and reactive to light.  Cardiovascular:     Rate and Rhythm: Normal rate and regular rhythm.     Pulses: Normal pulses.     Heart sounds: Normal heart sounds. No murmur heard.    No friction rub. No gallop.  Pulmonary:     Effort: Pulmonary effort is normal. No respiratory distress.     Breath sounds: Normal breath sounds. No wheezing, rhonchi or rales.  Abdominal:     General: Bowel sounds are normal. There is no distension.     Palpations: Abdomen is soft. There is no mass.     Tenderness: There is no abdominal tenderness. There is no guarding.  Musculoskeletal:        General: No deformity.     Cervical back: Normal range of motion and neck supple.     Right lower leg: No edema.     Left lower leg: No edema.  Lymphadenopathy:     Cervical: No cervical adenopathy.  Skin:    General: Skin is warm.     Capillary Refill: Capillary refill takes less than 2 seconds.     Findings: No erythema or rash.  Neurological:     General: No focal deficit present.     Mental Status: Krystal Marks is alert and oriented to person, place, and time.     Cranial Nerves: Cranial nerves 2-12 are intact. No cranial nerve deficit or facial asymmetry.     Motor: Motor function is intact. No weakness.     Gait: Gait normal.  Psychiatric:        Mood and Affect: Mood normal.        Behavior: Behavior normal.       Last depression screening scores    11/07/2023     1:17 PM 01/06/2023    1:09 PM 11/03/2022    1:06 PM  PHQ 2/9 Scores  PHQ - 2 Score 0 0 0  PHQ- 9 Score 0 0 0    Last fall risk screening    11/07/2023    1:16 PM  Fall Risk   Falls in the past year? 0  Number falls in past yr: 0  Injury with Fall? 0  Risk for fall due to : No Fall Risks    Last Audit-C alcohol use screening    11/07/2023    9:14 AM  Alcohol Use Disorder Test (AUDIT)  1. How often do you have a drink containing alcohol? 3  2. How many drinks containing alcohol do you have on a typical day when you are drinking? 0  3. How often do you have six or more drinks on one occasion? 0  AUDIT-C Score 3   4. How often during the last year have you found that you were not able  to stop drinking once you had started? 0  5. How often during the last year have you failed to do what was normally expected from you because of drinking? 0  6. How often during the last year have you needed a first drink in the morning to get yourself going after a heavy drinking session? 0  7. How often during the last year have you had a feeling of guilt of remorse after drinking? 0  8. How often during the last year have you been unable to remember what happened the night before because you had been drinking? 0  9. Have you or someone else been injured as a result of your drinking? 0  10. Has a relative or friend or a doctor or another health worker been concerned about your drinking or suggested you cut down? 0  Alcohol Use Disorder Identification Test Final Score (AUDIT) 3      Patient-reported   A score of 3 or more in women, and 4 or more in men indicates increased risk for alcohol abuse, EXCEPT if all of the points are from question 1   No results found for any visits on 11/07/23.  Assessment & Plan    Routine Health Maintenance and Physical Exam  Immunization History  Administered Date(s) Administered   Hepatitis B 07/05/2014, 08/05/2014, 10/05/2014   Hepatitis B, ADULT 08/21/2014,  10/24/2014   Influenza,inj,Quad PF,6+ Mos 01/27/2016, 01/20/2017, 09/21/2017   Tdap 07/16/2014    Health Maintenance  Topic Date Due   Pneumococcal Vaccine (1 of 2 - PCV) Never done   Hepatitis B Vaccines 19-59 Average Risk (3 of 3 - 19+ 3-dose series) 01/05/2015   Influenza Vaccine  08/05/2023   Mammogram  12/01/2023   COVID-19 Vaccine (1) 11/23/2023 (Originally [birth year]26/1980)   DTaP/Tdap/Td (2 - Td or Tdap) 07/15/2024   Cervical Cancer Screening (HPV/Pap Cotest)  02/17/2027   Colonoscopy  06/28/2033   Hepatitis C Screening  Completed   HIV Screening  Completed   HPV VACCINES  Aged Out   Meningococcal B Vaccine  Aged Out    Problem List Items Addressed This Visit     Abdominal bloating   Abdominal bloating and distension  Chronic, intermittent symptoms  with suspected gluten sensitivity Reports bloating and joint pain after consuming wheat-containing foods. Suspected gluten sensitivity. Discussed potential link between gluten sensitivity and infertility. - Ordered celiac panel including IGA      Relevant Orders   Celiac Disease Panel   Annual physical exam - Primary   Adult Wellness Visit Annual physical examination conducted. Reports fatigue, joint pain, and difficulty adjusting to new mattress affecting sleep. Recent mammogram completed in November 2024, awaiting system update. - Ordered routine labs and screenings - Ensured mammogram results are loaded into the system -recommended A1c, CMP, CBC, lipids  -recommend moderate physical activity and well balanced diet       Relevant Orders   HgB A1c   Comprehensive Metabolic Panel (CMET)   Lipid panel   Anxiety, generalized   Relevant Orders   TSH + free T4   Attention deficit hyperactivity disorder, predominantly inattentive type   Attention-deficit hyperactivity disorder, predominantly inattentive type Chronic  Currently on Adderall 20 mg twice daily. Previous trial of Vyvanse  was ineffective. Considering  increasing Adderall to 30 mg twice daily for better focus. - Increased Adderall to 30 mg twice daily      Relevant Medications   amphetamine -dextroamphetamine  (ADDERALL XR) 30 MG 24 hr capsule   Other Relevant Orders  TSH + free T4   Depression, major, single episode, moderate (HCC)   Relevant Orders   TSH + free T4   Fibromyalgia   Fibromyalgia with chronic joint pain, fatigue, and back and neck pain Chronic joint pain, fatigue, and recent acute onset of back and neck pain. Pain possibly related to mattress or dietary factors. Previous improvement with [employer]. Considering dietary adjustments to a Mediterranean diet. No prior rheumatological testing for joint pain. - Ordered cervical and lumbar spine X-rays - Ordered rheumatoid factor test - Ordered vitamin B12 test for fatigue      Migraine   Relevant Medications   EPINEPHrine  0.3 mg/0.3 mL IJ SOAJ injection   Other Visit Diagnoses       Screening for endocrine, metabolic and immunity disorder       Relevant Orders   HgB A1c   Comprehensive Metabolic Panel (CMET)     Screening for lipid disorders       Relevant Orders   Lipid panel     Screening for diabetes mellitus (DM)       Relevant Orders   HgB A1c   Comprehensive Metabolic Panel (CMET)     Screening for deficiency anemia       Relevant Orders   CBC     Polyarthralgia       Relevant Orders   Rheumatoid Factor     Other fatigue       Relevant Orders   Vitamin B12       Assessment and Plan Assessment & Plan         Return in about 3 months (around 02/07/2024) for ADHD,fatigue, joint pain .       Rockie Agent, MD  [facility] [phone] (phone) [phone] (fax)  [facility]

## 2023-11-07 NOTE — Patient Instructions (Signed)
 It was a pleasure to see you today!  Thank you for choosing [facility] for your primary care.   Today you were seen for your annual physical  Please review the attached information regarding helpful preventive health topics.   To keep you healthy, please keep in mind the following health maintenance items that you are due for:   Health Maintenance Due  Topic Date Due   Pneumococcal Vaccine (1 of 2 - PCV) Never done   Hepatitis B Vaccines 19-59 Average Risk (3 of 3 - 19+ 3-dose series) 01/05/2015   Influenza Vaccine  08/05/2023   Mammogram  12/01/2023     Best Wishes,   Dr. Lang

## 2023-11-07 NOTE — Assessment & Plan Note (Signed)
 Attention-deficit hyperactivity disorder, predominantly inattentive type Chronic  Currently on Adderall 20 mg twice daily. Previous trial of Vyvanse  was ineffective. Considering increasing Adderall to 30 mg twice daily for better focus. - Increased Adderall to 30 mg twice daily

## 2023-11-07 NOTE — Assessment & Plan Note (Signed)
 Fibromyalgia with chronic joint pain, fatigue, and back and neck pain Chronic joint pain, fatigue, and recent acute onset of back and neck pain. Pain possibly related to mattress or dietary factors. Previous improvement with [employer] Considering dietary adjustments to a Mediterranean diet. No prior rheumatological testing for joint pain. - Ordered cervical and lumbar spine X-rays - Ordered rheumatoid factor test - Ordered vitamin B12 test for fatigue

## 2023-11-07 NOTE — Assessment & Plan Note (Signed)
 Abdominal bloating and distension  Chronic, intermittent symptoms  with suspected gluten sensitivity Reports bloating and joint pain after consuming wheat-containing foods. Suspected gluten sensitivity. Discussed potential link between gluten sensitivity and infertility. - Ordered celiac panel including IGA

## 2023-11-07 NOTE — Assessment & Plan Note (Signed)
 Adult Wellness Visit Annual physical examination conducted. Reports fatigue, joint pain, and difficulty adjusting to new mattress affecting sleep. Recent mammogram completed in November 2024, awaiting system update. - Ordered routine labs and screenings - Ensured mammogram results are loaded into the system -recommended A1c, CMP, CBC, lipids  -recommend moderate physical activity and well balanced diet

## 2023-11-08 DIAGNOSIS — J301 Allergic rhinitis due to pollen: Secondary | ICD-10-CM | POA: Diagnosis not present

## 2023-11-09 ENCOUNTER — Ambulatory Visit: Payer: Self-pay | Admitting: Family Medicine

## 2023-11-09 ENCOUNTER — Encounter: Payer: Self-pay | Admitting: Family Medicine

## 2023-11-09 LAB — LIPID PANEL
Chol/HDL Ratio: 2.1 ratio (ref 0.0–4.4)
Cholesterol, Total: 177 mg/dL (ref 100–199)
HDL: 84 mg/dL (ref >39)
LDL Chol Calc (NIH): 78 mg/dL (ref 0–99)
Triglycerides: 85 mg/dL (ref 0–149)
VLDL Cholesterol Cal: 15 mg/dL (ref 5–40)

## 2023-11-09 LAB — CBC
Hematocrit: 41 % (ref 34.0–46.6)
Hemoglobin: 13.6 g/dL (ref 11.1–15.9)
MCH: 31.3 pg (ref 26.6–33.0)
MCHC: 33.2 g/dL (ref 31.5–35.7)
MCV: 95 fL (ref 79–97)
Platelets: 292 x10E3/uL (ref 150–450)
RBC: 4.34 x10E6/uL (ref 3.77–5.28)
RDW: 11.4 % — ABNORMAL LOW (ref 11.7–15.4)
WBC: 8.3 x10E3/uL (ref 3.4–10.8)

## 2023-11-09 LAB — CELIAC DISEASE PANEL
IgA/Immunoglobulin A, Serum: 143 mg/dL (ref 87–352)
Transglutaminase IgA: <2 U/mL (ref 0–3)

## 2023-11-09 LAB — RHEUMATOID FACTOR: Rheumatoid fact SerPl-aCnc: 11.3 [IU]/mL (ref <14.0)

## 2023-11-09 LAB — VITAMIN B12: Vitamin B-12: 558 pg/mL (ref 232–1245)

## 2023-11-09 LAB — COMPREHENSIVE METABOLIC PANEL WITH GFR
ALT: 14 IU/L (ref 0–32)
AST: 16 IU/L (ref 0–40)
Albumin: 4.5 g/dL (ref 3.9–4.9)
Alkaline Phosphatase: 72 IU/L (ref 41–116)
BUN/Creatinine Ratio: 19 (ref 9–23)
BUN: 15 mg/dL (ref 6–24)
Bilirubin Total: 0.8 mg/dL (ref 0.0–1.2)
CO2: 24 mmol/L (ref 20–29)
Calcium: 9.4 mg/dL (ref 8.7–10.2)
Chloride: 100 mmol/L (ref 96–106)
Creatinine, Ser: 0.79 mg/dL (ref 0.57–1.00)
Globulin, Total: 2.6 g/dL (ref 1.5–4.5)
Glucose: 93 mg/dL (ref 70–99)
Potassium: 4.3 mmol/L (ref 3.5–5.2)
Sodium: 136 mmol/L (ref 134–144)
Total Protein: 7.1 g/dL (ref 6.0–8.5)
eGFR: 92 mL/min/1.73 (ref >59)

## 2023-11-09 LAB — TSH+FREE T4
Free T4: 1.16 ng/dL (ref 0.82–1.77)
TSH: 1.66 u[IU]/mL (ref 0.450–4.500)

## 2023-11-09 LAB — HEMOGLOBIN A1C
Est. average glucose Bld gHb Est-mCnc: 105 mg/dL
Hgb A1c MFr Bld: 5.3 % (ref 4.8–5.6)

## 2023-11-10 ENCOUNTER — Other Ambulatory Visit: Payer: Self-pay | Admitting: Family Medicine

## 2023-11-10 DIAGNOSIS — F900 Attention-deficit hyperactivity disorder, predominantly inattentive type: Secondary | ICD-10-CM

## 2023-11-10 MED ORDER — AMPHETAMINE-DEXTROAMPHET ER 30 MG PO CP24: ORAL_CAPSULE | ORAL | 0 refills | Status: DC

## 2023-11-15 DIAGNOSIS — J301 Allergic rhinitis due to pollen: Secondary | ICD-10-CM | POA: Diagnosis not present

## 2023-11-22 DIAGNOSIS — J301 Allergic rhinitis due to pollen: Secondary | ICD-10-CM | POA: Diagnosis not present

## 2023-12-06 DIAGNOSIS — J301 Allergic rhinitis due to pollen: Secondary | ICD-10-CM | POA: Diagnosis not present

## 2023-12-08 ENCOUNTER — Other Ambulatory Visit: Payer: Self-pay | Admitting: Family Medicine

## 2023-12-08 DIAGNOSIS — F902 Attention-deficit hyperactivity disorder, combined type: Secondary | ICD-10-CM

## 2023-12-08 MED ORDER — AMPHETAMINE-DEXTROAMPHETAMINE 30 MG PO TABS: 30.0000 mg | ORAL_TABLET | Freq: Two times a day (BID) | ORAL | 0 refills | Status: DC

## 2023-12-13 DIAGNOSIS — J301 Allergic rhinitis due to pollen: Secondary | ICD-10-CM | POA: Diagnosis not present

## 2024-01-02 ENCOUNTER — Encounter: Payer: Self-pay | Admitting: Family Medicine

## 2024-01-06 ENCOUNTER — Other Ambulatory Visit: Payer: Self-pay | Admitting: Family Medicine

## 2024-01-06 DIAGNOSIS — K219 Gastro-esophageal reflux disease without esophagitis: Secondary | ICD-10-CM

## 2024-01-06 MED ORDER — LANSOPRAZOLE 30 MG PO CPDR: 30.0000 mg | DELAYED_RELEASE_CAPSULE | Freq: Every day | ORAL | 4 refills | Status: AC

## 2024-01-20 ENCOUNTER — Encounter: Payer: Self-pay | Admitting: Family Medicine

## 2024-01-20 ENCOUNTER — Other Ambulatory Visit: Payer: Self-pay

## 2024-01-20 DIAGNOSIS — F902 Attention-deficit hyperactivity disorder, combined type: Secondary | ICD-10-CM

## 2024-01-20 MED ORDER — AMPHETAMINE-DEXTROAMPHETAMINE 30 MG PO TABS: 30.0000 mg | ORAL_TABLET | Freq: Two times a day (BID) | ORAL | 0 refills | Status: AC

## 2024-01-20 NOTE — Telephone Encounter (Signed)
 Pt message  Hey Dr Lang... I know we were changing up the add rx and need to know if you sent in a 3 mos supply with the new ones on 12/2?  LOV 11/07/23 NOV 02/07/24 LRF 12/08/23 qty:60 r:0

## 2024-02-07 ENCOUNTER — Ambulatory Visit: Admitting: Family Medicine

## 2024-02-07 NOTE — Progress Notes (Unsigned)
" ° °  Established Patient Office Visit  Patient ID: Krystal Marks, female    DOB: 04/05/1975  Age: 49 y.o. MRN: [id number] PCP: Sharma Coyer, MD  No chief complaint on file.   Subjective:     HPI  Discussed the use of AI scribe software for clinical note transcription with the patient, who gave verbal consent to proceed.  History of Present Illness    {History (Optional):23778}  ROS    Objective:     There were no vitals taken for this visit. {Vitals History (Optional):23777}  Physical Exam Vitals reviewed.  Constitutional:      General: She is not in acute distress.    Appearance: Normal appearance. She is not ill-appearing.  Cardiovascular:     Rate and Rhythm: Normal rate and regular rhythm.  Pulmonary:     Effort: Pulmonary effort is normal. No respiratory distress.     Breath sounds: No wheezing, rhonchi or rales.  Neurological:     Mental Status: She is alert and oriented to person, place, and time.  Psychiatric:        Mood and Affect: Mood normal.        Behavior: Behavior normal.     {PhysExam Abridge (Optional):210964309} No results found for any visits on 02/07/24.  {Labs (Optional):23779}  The 10-year ASCVD risk score (Arnett DK, et al., 2019) is: 1.2%  Outpatient Encounter Medications as of 02/07/2024  Medication Sig   amphetamine -dextroamphetamine  (ADDERALL) 30 MG tablet Take 1 tablet by mouth 2 (two) times daily.   EPINEPHrine  0.3 mg/0.3 mL IJ SOAJ injection Inject into the muscle once as needed.   estradiol  (ESTRACE  VAGINAL) 0.1 MG/GM vaginal cream Place 1 Applicatorful vaginally at bedtime.   hydrOXYzine  (ATARAX ) 50 MG tablet TAKE 1.5-2 TABLETS (75-100 MG TOTAL) BY MOUTH EVERY 6 (SIX) HOURS AS NEEDED.   lansoprazole  (PREVACID ) 30 MG capsule Take 1 capsule (30 mg total) by mouth daily at 12 noon.   SUMAtriptan  (IMITREX ) 100 MG tablet TAKE 1 TABLET BY MOUTH X 1 DOSE NEEDED MIGRAINE. MAY REPEAT IN 2 HRS IF HEADACHE PERSISTS/RECURS.    No facility-administered encounter medications on file as of 02/07/2024.       Assessment & Plan:   Problem List Items Addressed This Visit     Acid reflux - Primary   Anxiety, generalized   Attention deficit hyperactivity disorder, predominantly inattentive type   Depression, major, single episode, moderate (HCC)   Migraine    Assessment and Plan Assessment & Plan     No follow-ups on file.    Coyer Sharma, MD [facility]  "
